# Patient Record
Sex: Female | Born: 1955 | Race: White | Hispanic: No | Smoking: Never smoker
Health system: Southern US, Community
[De-identification: ages and names within clinical notes are randomized; demographics above are authoritative.]

## PROBLEM LIST (undated history)

## (undated) DIAGNOSIS — K900 Celiac disease: Secondary | ICD-10-CM

## (undated) DIAGNOSIS — Z9889 Other specified postprocedural states: Secondary | ICD-10-CM

## (undated) DIAGNOSIS — R112 Nausea with vomiting, unspecified: Secondary | ICD-10-CM

## (undated) DIAGNOSIS — R011 Cardiac murmur, unspecified: Secondary | ICD-10-CM

## (undated) DIAGNOSIS — M797 Fibromyalgia: Secondary | ICD-10-CM

## (undated) DIAGNOSIS — F32A Depression, unspecified: Secondary | ICD-10-CM

## (undated) DIAGNOSIS — K219 Gastro-esophageal reflux disease without esophagitis: Secondary | ICD-10-CM

## (undated) DIAGNOSIS — T782XXA Anaphylactic shock, unspecified, initial encounter: Secondary | ICD-10-CM

## (undated) DIAGNOSIS — F329 Major depressive disorder, single episode, unspecified: Secondary | ICD-10-CM

## (undated) DIAGNOSIS — M1990 Unspecified osteoarthritis, unspecified site: Secondary | ICD-10-CM

## (undated) HISTORY — PX: WISDOM TOOTH EXTRACTION: SHX21

## (undated) HISTORY — DX: Morbid (severe) obesity due to excess calories: E66.01

## (undated) HISTORY — PX: SPINAL FUSION: SHX223

## (undated) HISTORY — DX: Cardiac murmur, unspecified: R01.1

## (undated) HISTORY — PX: OTHER SURGICAL HISTORY: SHX169

## (undated) HISTORY — PX: REPLACEMENT TOTAL KNEE: SUR1224

---

## 1998-08-21 ENCOUNTER — Other Ambulatory Visit: Admission: RE | Admit: 1998-08-21 | Discharge: 1998-08-21 | Payer: Self-pay | Admitting: Obstetrics and Gynecology

## 1998-08-22 ENCOUNTER — Encounter (INDEPENDENT_AMBULATORY_CARE_PROVIDER_SITE_OTHER): Payer: Self-pay

## 1998-08-22 ENCOUNTER — Other Ambulatory Visit: Admission: RE | Admit: 1998-08-22 | Discharge: 1998-08-22 | Payer: Self-pay | Admitting: Obstetrics and Gynecology

## 1999-05-23 ENCOUNTER — Other Ambulatory Visit: Admission: RE | Admit: 1999-05-23 | Discharge: 1999-05-23 | Payer: Self-pay | Admitting: *Deleted

## 1999-05-23 ENCOUNTER — Encounter (INDEPENDENT_AMBULATORY_CARE_PROVIDER_SITE_OTHER): Payer: Self-pay

## 1999-08-05 ENCOUNTER — Encounter: Admission: RE | Admit: 1999-08-05 | Discharge: 1999-08-05 | Payer: Self-pay | Admitting: Family Medicine

## 1999-08-05 ENCOUNTER — Encounter: Payer: Self-pay | Admitting: Family Medicine

## 1999-12-03 ENCOUNTER — Other Ambulatory Visit: Admission: RE | Admit: 1999-12-03 | Discharge: 1999-12-03 | Payer: Self-pay | Admitting: *Deleted

## 2000-01-03 ENCOUNTER — Encounter (INDEPENDENT_AMBULATORY_CARE_PROVIDER_SITE_OTHER): Payer: Self-pay | Admitting: Specialist

## 2000-01-03 ENCOUNTER — Other Ambulatory Visit: Admission: RE | Admit: 2000-01-03 | Discharge: 2000-01-03 | Payer: Self-pay | Admitting: *Deleted

## 2000-05-04 ENCOUNTER — Other Ambulatory Visit: Admission: RE | Admit: 2000-05-04 | Discharge: 2000-05-04 | Payer: Self-pay | Admitting: Obstetrics and Gynecology

## 2000-09-02 ENCOUNTER — Encounter: Admission: RE | Admit: 2000-09-02 | Discharge: 2000-09-02 | Payer: Self-pay | Admitting: Family Medicine

## 2000-09-02 ENCOUNTER — Encounter: Payer: Self-pay | Admitting: Family Medicine

## 2000-09-17 ENCOUNTER — Other Ambulatory Visit: Admission: RE | Admit: 2000-09-17 | Discharge: 2000-09-17 | Payer: Self-pay | Admitting: *Deleted

## 2001-03-09 ENCOUNTER — Other Ambulatory Visit: Admission: RE | Admit: 2001-03-09 | Discharge: 2001-03-09 | Payer: Self-pay | Admitting: Obstetrics and Gynecology

## 2001-09-03 ENCOUNTER — Encounter: Admission: RE | Admit: 2001-09-03 | Discharge: 2001-09-03 | Payer: Self-pay | Admitting: Family Medicine

## 2001-09-03 ENCOUNTER — Encounter: Payer: Self-pay | Admitting: Family Medicine

## 2001-09-30 ENCOUNTER — Other Ambulatory Visit: Admission: RE | Admit: 2001-09-30 | Discharge: 2001-09-30 | Payer: Self-pay | Admitting: Family Medicine

## 2002-01-26 ENCOUNTER — Ambulatory Visit (HOSPITAL_COMMUNITY): Admission: RE | Admit: 2002-01-26 | Discharge: 2002-01-26 | Payer: Self-pay | Admitting: Gastroenterology

## 2002-03-09 ENCOUNTER — Other Ambulatory Visit: Admission: RE | Admit: 2002-03-09 | Discharge: 2002-03-09 | Payer: Self-pay | Admitting: Family Medicine

## 2002-06-14 ENCOUNTER — Encounter: Payer: Self-pay | Admitting: Family Medicine

## 2002-06-14 ENCOUNTER — Encounter: Admission: RE | Admit: 2002-06-14 | Discharge: 2002-06-14 | Payer: Self-pay | Admitting: Family Medicine

## 2002-09-06 ENCOUNTER — Encounter: Admission: RE | Admit: 2002-09-06 | Discharge: 2002-09-06 | Payer: Self-pay | Admitting: Family Medicine

## 2002-09-06 ENCOUNTER — Encounter: Payer: Self-pay | Admitting: Family Medicine

## 2003-02-14 ENCOUNTER — Emergency Department (HOSPITAL_COMMUNITY): Admission: EM | Admit: 2003-02-14 | Discharge: 2003-02-14 | Payer: Self-pay | Admitting: *Deleted

## 2003-02-16 ENCOUNTER — Other Ambulatory Visit: Admission: RE | Admit: 2003-02-16 | Discharge: 2003-02-16 | Payer: Self-pay | Admitting: Family Medicine

## 2003-09-11 ENCOUNTER — Encounter: Admission: RE | Admit: 2003-09-11 | Discharge: 2003-09-11 | Payer: Self-pay | Admitting: Family Medicine

## 2004-01-05 ENCOUNTER — Ambulatory Visit (HOSPITAL_COMMUNITY): Admission: RE | Admit: 2004-01-05 | Discharge: 2004-01-05 | Payer: Self-pay | Admitting: Family Medicine

## 2004-01-26 ENCOUNTER — Ambulatory Visit (HOSPITAL_COMMUNITY): Admission: RE | Admit: 2004-01-26 | Discharge: 2004-01-26 | Payer: Self-pay | Admitting: Interventional Radiology

## 2004-02-09 ENCOUNTER — Ambulatory Visit (HOSPITAL_BASED_OUTPATIENT_CLINIC_OR_DEPARTMENT_OTHER): Admission: RE | Admit: 2004-02-09 | Discharge: 2004-02-09 | Payer: Self-pay | Admitting: Orthopedic Surgery

## 2004-02-14 ENCOUNTER — Ambulatory Visit (HOSPITAL_COMMUNITY): Admission: RE | Admit: 2004-02-14 | Discharge: 2004-02-14 | Payer: Self-pay | Admitting: Orthopedic Surgery

## 2004-02-21 ENCOUNTER — Ambulatory Visit (HOSPITAL_BASED_OUTPATIENT_CLINIC_OR_DEPARTMENT_OTHER): Admission: RE | Admit: 2004-02-21 | Discharge: 2004-02-21 | Payer: Self-pay | Admitting: Orthopedic Surgery

## 2004-02-28 ENCOUNTER — Ambulatory Visit (HOSPITAL_COMMUNITY): Admission: RE | Admit: 2004-02-28 | Discharge: 2004-02-28 | Payer: Self-pay | Admitting: Orthopedic Surgery

## 2004-06-04 ENCOUNTER — Other Ambulatory Visit: Admission: RE | Admit: 2004-06-04 | Discharge: 2004-06-04 | Payer: Self-pay | Admitting: Family Medicine

## 2004-10-14 ENCOUNTER — Encounter (INDEPENDENT_AMBULATORY_CARE_PROVIDER_SITE_OTHER): Payer: Self-pay | Admitting: Specialist

## 2004-10-14 ENCOUNTER — Ambulatory Visit (HOSPITAL_COMMUNITY): Admission: RE | Admit: 2004-10-14 | Discharge: 2004-10-14 | Payer: Self-pay | Admitting: Gastroenterology

## 2004-12-18 ENCOUNTER — Encounter: Admission: RE | Admit: 2004-12-18 | Discharge: 2004-12-18 | Payer: Self-pay | Admitting: Family Medicine

## 2005-07-29 ENCOUNTER — Ambulatory Visit (HOSPITAL_COMMUNITY): Admission: RE | Admit: 2005-07-29 | Discharge: 2005-07-29 | Payer: Self-pay | Admitting: Family Medicine

## 2005-08-13 ENCOUNTER — Ambulatory Visit: Payer: Self-pay | Admitting: Hematology and Oncology

## 2005-08-29 LAB — URINALYSIS, MICROSCOPIC - CHCC
Bilirubin (Urine): NEGATIVE
Glucose: NEGATIVE g/dL
Ketones: NEGATIVE mg/dL
RBC count: NEGATIVE (ref 0–2)

## 2005-08-29 LAB — CBC & DIFF AND RETIC
Basophils Absolute: 0.1 10*3/uL (ref 0.0–0.1)
Eosinophils Absolute: 0.1 10*3/uL (ref 0.0–0.5)
HCT: 35.2 % (ref 34.8–46.6)
HGB: 11.5 g/dL — ABNORMAL LOW (ref 11.6–15.9)
IRF: 0.26 (ref 0.130–0.330)
NEUT#: 2.5 10*3/uL (ref 1.5–6.5)
RDW: 17.8 % — ABNORMAL HIGH (ref 11.3–14.5)
RETIC #: 59.4 10*3/uL (ref 19.7–115.1)
lymph#: 1.9 10*3/uL (ref 0.9–3.3)

## 2005-08-29 LAB — CHCC SMEAR

## 2005-09-02 LAB — COMPREHENSIVE METABOLIC PANEL
ALT: 19 U/L (ref 0–40)
AST: 16 U/L (ref 0–37)
CO2: 20 mEq/L (ref 19–32)
Creatinine, Ser: 0.84 mg/dL (ref 0.40–1.20)
Total Bilirubin: 0.6 mg/dL (ref 0.3–1.2)

## 2005-09-02 LAB — FERRITIN: Ferritin: 2 ng/mL — ABNORMAL LOW (ref 10–291)

## 2005-09-02 LAB — DIRECT ANTIGLOBULIN TEST (NOT AT ARMC): DAT (Complement): NEGATIVE

## 2005-09-02 LAB — PROTEIN ELECTROPHORESIS, SERUM
Alpha-2-Globulin: 10.2 % (ref 7.1–11.8)
Beta 2: 5.4 % (ref 3.2–6.5)
Beta Globulin: 8 % — ABNORMAL HIGH (ref 4.7–7.2)
Total Protein, Serum Electrophoresis: 7.2 g/dL (ref 6.0–8.3)

## 2005-09-02 LAB — IRON AND TIBC: UIBC: 378 ug/dL

## 2005-09-02 LAB — ERYTHROPOIETIN: Erythropoietin: 41 m[IU]/mL — ABNORMAL HIGH (ref 2.6–34.0)

## 2005-10-03 ENCOUNTER — Ambulatory Visit: Payer: Self-pay | Admitting: Hematology and Oncology

## 2005-10-07 LAB — CBC WITH DIFFERENTIAL/PLATELET
Eosinophils Absolute: 0.2 10*3/uL (ref 0.0–0.5)
HGB: 13.3 g/dL (ref 11.6–15.9)
MONO#: 0.4 10*3/uL (ref 0.1–0.9)
NEUT#: 2.7 10*3/uL (ref 1.5–6.5)
Platelets: 299 10*3/uL (ref 145–400)
RBC: 5.05 10*6/uL (ref 3.70–5.32)
RDW: 23.9 % — ABNORMAL HIGH (ref 11.3–14.5)
WBC: 4.9 10*3/uL (ref 3.9–10.0)

## 2005-10-07 LAB — IRON AND TIBC
%SAT: 26 % (ref 20–55)
TIBC: 304 ug/dL (ref 250–470)

## 2005-10-07 LAB — BASIC METABOLIC PANEL
CO2: 25 mEq/L (ref 19–32)
Glucose, Bld: 110 mg/dL — ABNORMAL HIGH (ref 70–99)
Potassium: 4 mEq/L (ref 3.5–5.3)
Sodium: 142 mEq/L (ref 135–145)

## 2005-12-24 ENCOUNTER — Encounter: Admission: RE | Admit: 2005-12-24 | Discharge: 2005-12-24 | Payer: Self-pay | Admitting: Family Medicine

## 2005-12-31 ENCOUNTER — Ambulatory Visit: Payer: Self-pay | Admitting: Hematology and Oncology

## 2006-01-02 LAB — CBC WITH DIFFERENTIAL/PLATELET
EOS%: 6.1 % (ref 0.0–7.0)
MCH: 29.9 pg (ref 26.0–34.0)
MCV: 86.9 fL (ref 81.0–101.0)
MONO%: 7.9 % (ref 0.0–13.0)
RBC: 4.69 10*6/uL (ref 3.70–5.32)
RDW: 12.3 % (ref 11.3–14.5)

## 2006-01-02 LAB — IRON AND TIBC: %SAT: 23 % (ref 20–55)

## 2006-01-02 LAB — FERRITIN: Ferritin: 137 ng/mL (ref 10–291)

## 2006-01-02 LAB — BASIC METABOLIC PANEL
BUN: 14 mg/dL (ref 6–23)
Potassium: 4 mEq/L (ref 3.5–5.3)
Sodium: 142 mEq/L (ref 135–145)

## 2006-01-19 ENCOUNTER — Inpatient Hospital Stay (HOSPITAL_COMMUNITY): Admission: RE | Admit: 2006-01-19 | Discharge: 2006-01-22 | Payer: Self-pay | Admitting: Orthopedic Surgery

## 2006-04-20 ENCOUNTER — Inpatient Hospital Stay (HOSPITAL_COMMUNITY): Admission: RE | Admit: 2006-04-20 | Discharge: 2006-04-23 | Payer: Self-pay | Admitting: Orthopedic Surgery

## 2006-05-12 ENCOUNTER — Encounter: Admission: RE | Admit: 2006-05-12 | Discharge: 2006-06-24 | Payer: Self-pay | Admitting: Orthopedic Surgery

## 2006-12-28 ENCOUNTER — Encounter: Admission: RE | Admit: 2006-12-28 | Discharge: 2006-12-28 | Payer: Self-pay | Admitting: Family Medicine

## 2008-02-10 ENCOUNTER — Encounter: Admission: RE | Admit: 2008-02-10 | Discharge: 2008-02-10 | Payer: Self-pay | Admitting: Family Medicine

## 2008-09-19 ENCOUNTER — Ambulatory Visit (HOSPITAL_BASED_OUTPATIENT_CLINIC_OR_DEPARTMENT_OTHER): Admission: RE | Admit: 2008-09-19 | Discharge: 2008-09-19 | Payer: Self-pay | Admitting: Rheumatology

## 2008-09-29 ENCOUNTER — Ambulatory Visit: Payer: Self-pay | Admitting: Internal Medicine

## 2008-10-23 ENCOUNTER — Ambulatory Visit (HOSPITAL_COMMUNITY): Admission: RE | Admit: 2008-10-23 | Discharge: 2008-10-23 | Payer: Self-pay | Admitting: Rheumatology

## 2009-05-01 ENCOUNTER — Encounter: Admission: RE | Admit: 2009-05-01 | Discharge: 2009-05-01 | Payer: Self-pay | Admitting: Family Medicine

## 2009-05-07 ENCOUNTER — Emergency Department (HOSPITAL_COMMUNITY): Admission: EM | Admit: 2009-05-07 | Discharge: 2009-05-07 | Payer: Self-pay | Admitting: Emergency Medicine

## 2009-06-09 ENCOUNTER — Emergency Department (HOSPITAL_COMMUNITY): Admission: EM | Admit: 2009-06-09 | Discharge: 2009-06-09 | Payer: Self-pay | Admitting: Family Medicine

## 2009-07-11 ENCOUNTER — Other Ambulatory Visit: Admission: RE | Admit: 2009-07-11 | Discharge: 2009-07-11 | Payer: Self-pay | Admitting: Family Medicine

## 2009-10-19 ENCOUNTER — Ambulatory Visit (HOSPITAL_COMMUNITY): Admission: RE | Admit: 2009-10-19 | Discharge: 2009-10-19 | Payer: Self-pay | Admitting: Gastroenterology

## 2010-03-27 ENCOUNTER — Other Ambulatory Visit: Payer: Self-pay | Admitting: Family Medicine

## 2010-03-27 DIAGNOSIS — Z1231 Encounter for screening mammogram for malignant neoplasm of breast: Secondary | ICD-10-CM

## 2010-04-09 LAB — POCT I-STAT, CHEM 8
BUN: 9 mg/dL (ref 6–23)
Calcium, Ion: 1.09 mmol/L — ABNORMAL LOW (ref 1.12–1.32)
Creatinine, Ser: 0.9 mg/dL (ref 0.4–1.2)
Glucose, Bld: 95 mg/dL (ref 70–99)
Hemoglobin: 15.6 g/dL — ABNORMAL HIGH (ref 12.0–15.0)
Potassium: 3.8 mEq/L (ref 3.5–5.1)
Sodium: 138 mEq/L (ref 135–145)

## 2010-05-07 ENCOUNTER — Ambulatory Visit
Admission: RE | Admit: 2010-05-07 | Discharge: 2010-05-07 | Disposition: A | Discharge status: Home / Self Care | Payer: 59 | Source: Ambulatory Visit | Attending: Family Medicine | Admitting: Family Medicine

## 2010-05-07 DIAGNOSIS — Z1231 Encounter for screening mammogram for malignant neoplasm of breast: Secondary | ICD-10-CM

## 2010-05-21 ENCOUNTER — Ambulatory Visit: Payer: 59 | Attending: Orthopedic Surgery | Admitting: Physical Therapy

## 2010-05-21 DIAGNOSIS — IMO0001 Reserved for inherently not codable concepts without codable children: Secondary | ICD-10-CM | POA: Insufficient documentation

## 2010-05-21 DIAGNOSIS — M25569 Pain in unspecified knee: Secondary | ICD-10-CM | POA: Insufficient documentation

## 2010-05-21 DIAGNOSIS — Z96659 Presence of unspecified artificial knee joint: Secondary | ICD-10-CM | POA: Insufficient documentation

## 2010-05-27 ENCOUNTER — Ambulatory Visit: Payer: 59 | Admitting: Physical Therapy

## 2010-05-29 ENCOUNTER — Ambulatory Visit: Payer: 59 | Admitting: Physical Therapy

## 2010-06-04 ENCOUNTER — Ambulatory Visit: Payer: 59 | Admitting: Physical Therapy

## 2010-06-04 NOTE — Procedures (Signed)
Mary [name], Mary [name]                ACCOUNT NO.:  [id number]   MEDICAL RECORD NO.:  [id number]          PATIENT TYPE:  OUT   LOCATION:  SLEEP CENTER                 FACILITY:  [facility]   PHYSICIAN:  Clinton D. Maple Hudson, MD, FCCP, FACPDATE OF BIRTH:  07-14-1953   DATE OF STUDY:  09/19/2008                            NOCTURNAL POLYSOMNOGRAM   REFERRING PHYSICIAN:  Pollyann Savoy, M.D.   INDICATION FOR STUDY:  Hypersomnia with sleep apnea.   EPWORTH SLEEPINESS SCORE:  Epworth sleepiness score 16/24, BMI 41.3.  Weight 233 pounds, height 63 inches.  Neck 14 inches.   MEDICATIONS:  Home medications charted and reviewed.   SLEEP ARCHITECTURE:  Total sleep time 276 minutes with sleep efficiency  77.3%.  Stage I was 12%, stage II 79.3%, stage III absent, REM 8.7% of  total sleep time.  Sleep latency 47.5 minutes, REM latency 266.5  minutes, awake after sleep onset 31.5 minutes, arousal index 29.6.  Cymbalta was taken at 10:00 p.m.   RESPIRATORY DATA:  Apnea/hypopnea index (AHI) 0.7 per hour.  A total of  3 events were scored, all as hypopneas.  Most events were recorded while  lying supine although a considerable portion of the night was spent  lying prone.  REM AHI 2.5.  An additional 24 respiratory effort related  arousals were noted by the technician, events not meeting duration or  intensity criteria to be scored as apneas or hypopneas but still  recognized as contributing to a cumulative respiratory disturbance index  (RDI) of 5.9 per hour.  This was a diagnostic NPSG study.   OXYGEN DATA:  Mild to moderate snoring with oxygen desaturation to a  nadir of 86%.  Mean oxygen saturation through the study was 91% on room  air.  A total of 1.6 minutes was recorded with room air oxygen  concentration less than 88%.   CARDIAC DATA:  Sinus rhythm with occasional PAC.   MOVEMENT-PARASOMNIA:  No significant movement disturbance.  One bathroom  trip.   IMPRESSIONS-RECOMMENDATIONS:  1. Sleep  architecture was somewhat fragmented with reduced time spent      in REM which may reflect her Cymbalta or may simply be due to sleep      in an unfamiliar environment.  2. Occasional respiratory events with respiratory disturbance, within      normal limits, AHI 0.7 per hour (normal range 0-5 per hour).  Mild-      to-moderate snoring with oxygen desaturation to a nadir of 86%.      Clinton D. Maple Hudson, MD, [facility], FACP  Diplomate, Biomedical engineer of Sleep Medicine  Electronically Signed     CDY/MEDQ  D:  09/30/2008 11:02:55  T:  09/30/2008 12:52:20  Job:  [id number]

## 2010-06-05 ENCOUNTER — Encounter: Payer: 59 | Admitting: Physical Therapy

## 2010-06-07 NOTE — Op Note (Signed)
Mary [name], Mary [name] NO.:  [id number]   MEDICAL RECORD NO.:  [id number]          PATIENT TYPE:  INP   LOCATION:  X006                         FACILITY:  [facility]   PHYSICIAN:  Ollen Gross, M.D.    DATE OF BIRTH:  [birth year]   DATE OF PROCEDURE:  01/19/2006  DATE OF DISCHARGE:                               OPERATIVE REPORT   PREOPERATIVE DIAGNOSIS:  Osteoarthritis, left knee.   POSTOPERATIVE DIAGNOSIS:  Osteoarthritis, left knee.   PROCEDURE:  Left total knee arthroplasty.   SURGEON:  Ollen Gross, M.D.   ASSISTANT:  Avel Peace, PA-C   ANESTHESIA:  Spinal.   ESTIMATED BLOOD LOSS:  Minimal.   DRAINS:  Hemovac times one.   TOURNIQUET TIME:  41 minutes at 300 mmHg.   COMPLICATIONS:  None.  Condition stable to recovery.   BRIEF CLINICAL NOTE:  Mary [name] is a 55 year old female with end-stage  arthritis of both knees, left currently more symptomatic than the right.  She has failed nonoperative management presents for total knee  arthroplasty.   PROCEDURE IN DETAIL:  After successful administration of spinal  anesthetic, a tourniquet was placed high on the left thigh and left  lower extremity prepped and draped in the usual sterile fashion.  Extremities wrapped in Esmarch, knee flexed, tourniquet inflated to 300  mmHg.  Midline incision made with 10 blade through subcutaneous tissue  to the level of the extensor mechanism.  Fresh blade is used make a  medial parapatellar arthrotomy.  Soft tissue over the proximal medial  tibia subperiosteally elevated to the joint line with a knife and into  the semimembranosus bursa with a Cobb elevator.  Soft tissue laterally  is elevated with attention being paid to avoid patellar tendon on tibial  tubercle.  Patella subluxed laterally, knee flexed 90 degrees and ACL  and PCL removed.  Drill was used to create a starting hole in a distal  femur and canal was thoroughly irrigated.  5 degree left valgus  alignment guide  is placed referencing off the posterior condyles,  rotations marked and the block pinned to remove 10 mm off the distal  femur.  Distal femoral resection is made with an oscillating saw.  Sizing blocks placed and a size 3 is most appropriate.  Rotations marked  off the epicondylar axis.  Size 3 cutting block is placed and the  anterior-posterior chamfer cuts made.   Tibia subluxed forward and the menisci removed.  Extramedullary tibial  alignment guides placed referencing proximally at the medial aspect of  the tibial tubercle and distally along the second metatarsal axis and  tibial crest.  Blocks pinned to remove 10 mm of the non deficient  lateral side.  Tibial resection is made with an oscillating saw.  Size 3  is the most appropriate tibial component and the proximal tibia is  prepared the modular drill and keel punch for a size 3.  Femoral  preparation is completed with intercondylar cut.   Size 3 mobile bearing tibial trial with a size 3 posterior stabilized  femoral trial and a 10 mL posterior stabilized rotating  platform insert  trial were placed.  With the 10 there is just a tiny bit hyperextension  so I went to 12.5 which still allowed for full extension with excellent  varus and valgus balance throughout full range of motion and no  hyperextension noted.  The patella was everted, thickness measured to 23  mm.  Freehand resection is taken 13 mm, 38 template is placed, lug holes  were drilled, trial patella was placed and it tracks normally.  Osteophytes removed the distal femur with the trial placed.  All trials  were removed and the cut bone surfaces prepared with pulsatile lavage.  Cement was mixed and once ready for implantation, the size 3 mobile  bearing tibial tray, size 3 posterior stabilized femur and 38 patella  are cemented into place.  The patella was held with a clamp.  Trial 12.5  inserts placed, knee held in full extension all extruded cement removed.  Once  cement fully hardened, the permanent 12.5 mm posterior stabilized  rotating platform insert is placed into the tibial tray.  Wound was  copiously irrigated with saline solution.  The extensor mechanism then  closed over Hemovac drain with interrupted #1 PDS.  Flexion against  gravity to 125 degrees.  Tourniquet released for total time of 41  minutes.  Subcu closed with interrupted 2-0 Vicryl and subcuticular  running 4-0 Monocryl.  The drains hooked to suction.  Incision cleaned  and dried and Steri-Strips and bulky sterile dressing applied.  She is  then awakened and transferred to recovery in stable condition.      Ollen Gross, M.D.  Electronically Signed     FA/MEDQ  D:  01/19/2006  T:  01/19/2006  Job:  [id number]

## 2010-06-07 NOTE — Op Note (Signed)
[name], [name] NO.:  [id number]   MEDICAL RECORD NO.:  [id number]          PATIENT TYPE:  INP   LOCATION:  0005                         [facility]ACILITY[facility]   PHYSICIAN:  Ollen Gross, M.D.    DATE OF BIRTH:  [birth year]   DATE OF PROCEDURE:  04/20/2006  DATE OF DISCHARGE:                               OPERATIVE REPORT   PREOPERATIVE DIAGNOSIS:  Osteoarthritis right knee.   POSTOPERATIVE DIAGNOSIS:  Osteoarthritis right knee.   OPERATION/PROCEDURE:  Right total knee arthroplasty.   SURGEON:  Ollen Gross, M.D.   ASSISTANT:  Alexzandrew L. Julien Girt, P.A-C.   ANESTHESIA:  Spinal.   ESTIMATED BLOOD LOSS:  Minimal.   DRAINS:  Hemovac times one.   TOURNIQUET TIME:  43 minutes at 300 mmHg.   COMPLICATIONS:  None.   CONDITION:  Stable to recovery room.   CLINICAL NOTE:  Mary [name] is a 55 year old female who has had progressively  worsening right knee pain and dysfunction with bone on bone arthritis in  the knee.  She has had a recent successful left total knee arthroplasty  and presents now for right total knee arthroplasty.   PROCEDURE IN DETAIL:  After successful administration of spinal  anesthetic, a tourniquet was placed high on the right thigh and right  lower extremity prepped and draped in the usual sterile fashion.  Extremity was wrapped in Esmarch, knee flexed, tourniquet inflated to  300 mmHg.  Standard midline incision made with 10-blade through  subcutaneous tissue to elevate the extensor mechanism.  Fresh blade is  used make a medial parapatellar arthrotomy.  Soft tissue over the  proximal medial tibia is subperiosteally elevated to the joint line with  the knife and into the semimembranosus bursa with a Cobb elevator.  Soft  tissue laterally is elevated with attention being paid to avoid patellar  tendon on tibial tubercle.  The patella subluxed laterally, knee flexed  90 degrees.  ACL and PCL removed.  Drill was used create a starting  hole  in the distal femur and the canal was thoroughly irrigated.  A 5-degree  right valgus alignment guide is placed referencing off the posterior  condyles.  Rotations marked and the block pinned to remove 10 mm off the  distal femur.  Distal femoral resection is made with an oscillating saw.  Size 3 is the most appropriate femoral component and the rotations  marked off the epicondylar axis.  Size three cutting blocks placed in  the anterior-posterior and chamfer cuts made.   Tibia subluxed forward and the menisci removed.  Extramedullary tibial  alignment guide is placed referencing proximally at the medial aspect of  the tibial tubercle and distally along the second metatarsal axis and  tibial crest.  The block is pinned to remove 10 mm of the non-deficient  lateral side.  Tibial resection is made with an oscillating saw.  Size  2.5 is the most appropriate tibial component and the proximal tibia  prepared with the modular drill and keel punch for a size 2.5.  Femoral  preparation is completed with the intercondylar cut for the size  3.   Size 2.5 mobile bearing tibial trial, size 3 posterior stabilized  femoral trial, and 10 mL posterior stabilized rotating platform insert  trial placed.  With the 10, full extensions achieved with excellent  varus and valgus balance throughout full range of motion.  The patella  was then everted and thickness measured to be 21 mm.  Freehand resection  taken to 13 mm, 35 template is placed, lug holes drilled, trial patella  placed, and it tracks normally.  Osteophytes removed off the posterior  femur with the trial placed.  All trials removed and the cut bone  surfaces are prepared with pulsatile lavage.  Cement is mixed and once  ready for implantation, the size 2.5 mobile bearing tibial tray size 3  posterior stabilized femur and 35 patella are cemented in place.  The  patella is held with a clamp.  A 10 mm trial insert is placed, knee held  in  full extension and all extruded cement removed.  Once cement fully  hardened, then the permanent 10 mm posterior stabilized rotating  platform insert is placed into the tibial tray.  The wound is copiously  irrigated with saline solution.  The extensor mechanism was closed over  Hemovac drain with interrupted #1 Vicryl.  The tourniquet is then  released with total time of 43 minutes.  Flexion against gravity to 135  degrees.  Subcu closed with interrupted 2-0 Vicryl subcuticular and  running 4-0 Monocryl.  Drains hooked to suction.  Incision cleaned and  dried.  Steri-Strips and a bulky sterile dressing applied.  She is  placed into a knee immobilizer, awakened and transported to recovery in  stable condition.      Ollen Gross, M.D.  Electronically Signed     FA/MEDQ  D:  04/20/2006  T:  04/20/2006  Job:  [id number]

## 2010-06-07 NOTE — Discharge Summary (Signed)
NAMEKENSI, [name]                ACCOUNT NO.:  [id number]   MEDICAL RECORD NO.:  [id number]          PATIENT TYPE:  INP   LOCATION:  1520                         FACILITY:  [facility]   PHYSICIAN:  Ollen Gross, M.D.    DATE OF BIRTH:  12-May-1955   DATE OF ADMISSION:  04/20/2006  DATE OF DISCHARGE:  04/23/2006                               DISCHARGE SUMMARY   ADMISSION DIAGNOSES:  1. Osteoarthritis right knee.  2. Headaches.  3. Mild depression.  4. Fibromyalgia.  5. Lymphocytic colitis.  6. Hemorrhoids.  7. History of urinary tract infections.  8. History of cystitis.  9. Anemia.  10.Postmenopausal.   DISCHARGE DIAGNOSIS:  1. Osteoarthritis right knee, status post right total knee      arthroplasty.  2. Mild postop blood loss anemia.  Did not require transfusion.  3. Headaches.  4. Mild depression.  5. Fibromyalgia.  6. Lymphocytic colitis.  7. Hemorrhoids.  8. History of urinary tract infections.  9. History of cystitis.  10.Anemia.  11.Postmenopausal.   PROCEDURE:  04/20/2006, right total knee arthroplasty.  Surgeon Dr.  Ollen Gross, assistant Avel Peace PA-C.  Spinal anesthesia.  Time  43 minutes.   CONSULTATIONS:  Consults none.   BRIEF HISTORY:  [name] is a 55 year old female who has had  progressive worsening right knee pain and dysfunction with bone-on-bone  arthritis with recent successful left total knee now presents for right  total knee.   LABORATORY DATA:  Preop CBC showed hemoglobin of 14.9, hematocrit 43.7,  white cell count preop was 5.9, postop hemoglobin 12.2.  It drifted down  to 10.5.  Last noted H&H stabilized at 10.5 with a crit of 30.1.  PT/PTT  on admission 12.3 and 30 respectively.  INR 0.9.  Serial protimes  followed.  Last PT/INR 28.2, 2.5 Chem panel on admission all within  normal limits.  Serial BMPs followed.  Electrolytes remained within  normal limits.  Preop UA negative.  Blood type O+.   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  No other abnormalities.   HOSPITAL COURSE:  The patient was admitted to [facility],  tolerated her procedure well, later to recovery and orthopedic floor.  Started on PCA and p.o. analgesic for pain control following surgery.  Given 24 hours postop IV antibiotics.  Started on Coumadin for DVT  prophylaxis.  Actually doing pretty well on the morning of day one  rounds.  She was using the PCA.  Encouraged p.o. meds for better  control.  Foley was removed.  She had history of UTIs, so preop UA was  negative.  Would recheck her urine if she developed any symptoms.  She  had a little bit of low urinary output, but she was voiding.  Monitored  her output and output did increase.  She actually had much more decent  output through that afternoon.   She got up out of bed by day two.  She was doing better.  Started  getting up with physical therapy.  She was up ambulating approximately  about 65 feet.  Dressing change incision looked good.  IV fluids are  discontinued.  Dressing was changed.  Incision healing well.  Continued  to progress well and was ready to go home by the following day of postop  day #3.  She had a little bit of drop in her O2 sats on the evening of  day two, morning of day 3, so we did check a chest x-ray and it only  showed some mild atelectasis.  She did have a normal white count.  Once  that was okay, encouraged incentive spirometer and tolerating her meds  and was discharged home.   DISCHARGE/PLAN:  1. The patient discharged home on 04/23/2006.  2. Discharge diagnoses please see above.  3. Discharge meds:  Coumadin, Percocet, Robaxin.  4. Diet.  Resume home diet.  5. Follow-up 2 weeks.  6. Activity:  Weightbearing as tolerated.  Total knee protocol.  Home      health PT, home health nursing.   DISPOSITION:  Home.   CONDITION ON DISCHARGE:  Improving.      Alexzandrew L. Perkins, P.A.C.       Ollen Gross, M.D.  Electronically Signed    ALP/MEDQ  D:  05/27/2006  T:  05/27/2006  Job:  [id number]   cc:   Ollen Gross, M.D.  Fax: [phone]   Talmadge Coventry, M.D.  Fax: [phone]   EXBMWU XLK GMWN, M.D.  Fax: [phone]

## 2010-06-07 NOTE — Op Note (Signed)
Mary [name], Mary [name]                ACCOUNT NO.:  [id number]   MEDICAL RECORD NO.:  [id number]          PATIENT TYPE:  AMB   LOCATION:  ENDO                         FACILITY:  [facility]   PHYSICIAN:  Anselmo Rod, M.D.  DATE OF BIRTH:  01-Mar-1955   DATE OF PROCEDURE:  10/14/2004  DATE OF DISCHARGE:                                 OPERATIVE REPORT   PROCEDURE:  Esophagogastroduodenoscopy with small bowel biopsy.   ENDOSCOPIST:  Charna Elizabeth, M.D.   INSTRUMENT USED:  Olympus video panendoscope.   INDICATIONS FOR PROCEDURE:  55 year old white female undergoing EGD for  change in bowel habits.   PREPROCEDURE PREPARATION:  Informed consent was obtained from the patient.  The patient was fasted for eight hours prior to the procedure.   PREPROCEDURE PHYSICAL:  Patient with stable vital signs.  Neck supple.  Chest clear to auscultation.  S1 and S2 regular.  Abdomen soft with normal  bowel sounds.   DESCRIPTION OF PROCEDURE:  The patient was placed in the left lateral  decubitus position, sedated with 60 mg of Demerol and 6 mg Versed in slow  incremental doses.  Once the patient was adequately sedated, maintained on  low flow oxygen and continuous cardiac monitoring, the Olympus video  panendoscope was advanced through the mouth piece over the tongue into the  esophagus under direct vision.  The entire esophagus appeared normal with no  evidence of ring, strictures, masses, esophagitis, or Barrett's mucosa.  The  scope was then advanced into the stomach.  There was evidence of diffuse  gastritis.  There was some old heme noted in the stomach.  Gastric biopsies  were done to rule out H. pylori.  The proximal small bowel appeared normal.  Small bowel biopsies were done to rule out sprue.  Retroflexion in the high  cardia revealed no abnormalities.   IMPRESSION:  1.  Normal appearing esophagus.  2.  Diffuse gastritis, biopsies done for H. pylori.  3.  Normal proximal small bowel, small  bowel biopsies done to rule out      sprue.   RECOMMENDATIONS:  1.  Await pathology results.  2.  Proceed with colonoscopy at this time.  3.  Avoid all nonsteroidals for now.  4.  Outpatient follow up in the next two weeks for further recommendations.      Anselmo Rod, M.D.  Electronically Signed     JNM/MEDQ  D:  10/14/2004  T:  10/15/2004  Job:  [id number]   cc:   Talmadge Coventry, M.D.  Fax: [phone]

## 2010-06-07 NOTE — Op Note (Signed)
Mary [name], Mary [name]                ACCOUNT NO.:  [id number]   MEDICAL RECORD NO.:  [id number]          PATIENT TYPE:  AMB   LOCATION:  ENDO                         FACILITY:  [facility]   PHYSICIAN:  Anselmo Rod, M.D.  DATE OF BIRTH:  06-03-1955   DATE OF PROCEDURE:  10/14/2004  DATE OF DISCHARGE:                                 OPERATIVE REPORT   PROCEDURE:  Screening colonoscopy.   ENDOSCOPIST:  Charna Elizabeth, M.D.   INSTRUMENT USED:  Olympus video colonoscope.   INDICATIONS FOR PROCEDURE:  55 year old white female with a history of  diarrhea for the last few months undergoing colonoscopy to rule out colonic  polyps, masses, etc.   PREPROCEDURE PREPARATION:  Informed consent was obtained from the patient.  The patient was fasted for eight hours prior to the procedure and prepped  with Osmoprep the night prior to the procedure and the morning of the  procedure according to protocol.  The risks and benefits of the procedure  including a 10% miss rate of cancer and polyps were discussed with the  patient, as well.   PREPROCEDURE PHYSICAL:  Patient with stable vital signs.  Neck supple.  Chest clear to auscultation.  S1 and S2 regular.  Abdomen soft with normal  bowel sounds.   DESCRIPTION OF PROCEDURE:  The patient was placed in the left lateral  decubitus position, sedated with 20 mg of Demerol and 4 mg Versed in slow  incremental doses.  Once the patient was adequately sedated, maintained on  low flow oxygen and continuous cardiac monitoring, the Olympus video  colonoscope was advanced from the rectum to the cecum.  Small internal  hemorrhoids were seen on retroflexion of the rectum.  No masses or polyps  were identified.  Multiple colonic biopsies were done randomly throughout  the colon to rule out collagenous or microscopic colitis.  The patient  tolerated the procedure well without complications.   IMPRESSION:  1.  Small nonbleeding internal hemorrhoids.  2.  No masses,  polyps, or diverticula seen, multiple random biopsies done to      rule out collagenous or microscopic colitis.   RECOMMENDATIONS:  1.  Await pathology results.  2.  Avoid nonsteroidals.  3.  Outpatient follow up in the next few weeks for further recommendations.      Anselmo Rod, M.D.  Electronically Signed     JNM/MEDQ  D:  10/14/2004  T:  10/15/2004  Job:  [id number]   cc:   Mary [name], M.D.  Fax: [phone]

## 2010-06-07 NOTE — Op Note (Signed)
NAMEPILAR, Mary [name]                ACCOUNT NO.:  [id number]   MEDICAL RECORD NO.:  [id number]          PATIENT TYPE:  AMB   LOCATION:  DSC                          FACILITY:  [facility]   PHYSICIAN:  Thera Flake., M.D.DATE OF BIRTH:  Jul 08, 1955   DATE OF PROCEDURE:  02/09/2004  DATE OF DISCHARGE:                                 OPERATIVE REPORT   REFERRING PHYSICIAN:  Sanjeev K. Corliss Skains, M.D.   INDICATIONS FOR PROCEDURE:  This is a 56 year old MRI proven meniscal tear  with degenerative change thought to be amenable to outpatient surgery.   PREOPERATIVE DIAGNOSES:  1.  Complex tear posterior horn medial meniscus left knee.  2.  Grade III chondromalacia patellofemoral joint.  3.  Grade III chondromalacia medial compartment (generalized).   OPERATION:  1.  Partial medial meniscectomy (posterior horn).  2.  Debridement chondroplasty patellofemoral joint medial compartment.   SURGEON:  Dyke Brackett, M.D.   ANESTHESIA:  MAC.   DESCRIPTION OF PROCEDURE:  She was arthroscope for an inferior medial and  inferolateral portal.  Systematic inspection of the knee showed generalized  grade III chondromalacia most noticeable on the patella.  All areas of the  patella were involved, probably greater than 50% of the surface area.  Trochlea grid was relatively spared.  Again, extensive aggressive  debridement of the patellofemoral joint was carried out separate from the  medial compartment.  Lateral compartment was normal.  ACL/PCL normal.   Medial compartment showed an extensive tear of the posterior horn requiring  resection of about 30 to 40% of the meniscus substance.  There was actually  rather generalized grade III changes along the entire medial compartment  with no grade IV changes noted.  These were aggressively debrided.  Certainly this was noticeable compared to the  opposite side.  Again, the medial and patellofemoral joint were debrided.  Medial meniscectomy.  Knee drained  free of fluid.  Portals closed with  nylon.  Knee injected with Marcaine and an additional 40 mg Depo-Medrol,  taken to the recovery room in stable condition.      WDC/MEDQ  D:  02/09/2004  T:  02/09/2004  Job:  [id number]   cc:   Sanjeev K. Corliss Skains, M.D.  [address] [place]  [place]  [place] [address]  Fax: [phone]

## 2010-06-07 NOTE — Discharge Summary (Signed)
Mary [name], Mary [name]                ACCOUNT NO.:  [id number]   MEDICAL RECORD NO.:  [id number]          PATIENT TYPE:  INP   LOCATION:  1509                         FACILITY:  [facility]   PHYSICIAN:  Ollen Gross, M.D.    DATE OF BIRTH:  [birth year]   DATE OF ADMISSION:  01/19/2006  DATE OF DISCHARGE:  01/22/2006                               DISCHARGE SUMMARY   ADMISSION DIAGNOSES:  1. Osteoarthritis of bilateral knees, left greater than right.  2. Fibromyalgia.  3. Mild depression.  4. Headaches.  5. Lymphocytic colitis.  6. Hemorrhoids.  7. History of urinary tract infections.  8. History of cystitis.  9. History of anemia.  10.Post-menopausal.   DISCHARGE DIAGNOSES:  1. Osteoarthritis, left knee, status post left total knee replacement      arthroplasty.  2. Osteoarthritis, right knee.  3. Fibromyalgia.  4. Mild depression.  5. Headaches.  6. Lymphocytic colitis.  7. Hemorrhoids.  8. History of urinary tract infections.  9. History of cystitis.  10.History of anemia.  11.Post-menopausal.  12.Mild postoperative hypokalemia.   PROCEDURE:  A left total knee surgery by Dr. Ollen Gross with  assistant Alexzandrew L. Perkins, P.A.-C. under spinal anesthesia.  The  tourniquet time was 41 minutes.   CONSULTATIONS:  None.   HISTORY:  Ms. Nowotny is a 54 year old female with end-stage  osteoarthritis of both knees, the left currently more symptomatic than  the right.  She has failed on medical management and now presents for a  total knee.   LABORATORY DATA:  Hemoglobin 14.8 on admission, hematocrit 43.4, white  count 4.3.  Postoperative hemoglobin 13, which drifted down.  The last  H&H was 12.3 and 36.  PT and PTT on admission 12.1 and 29 respectively,  INR 0.9.  The last PTT and INR was 22.1 and 1.8.  The chemistry panel on  admission all within normal limits.  The serial BMETs were followed.  The potassium did drop a little bit, just barely low at 3.4.  The  remaining  BMET within normal limits.  Urine pregnancy was negative.  A  preop urinalysis was negative.  Blood group type O-positive.   Electrocardiogram on January 06, 2006:  Normal sinus rhythm, normal  electrocardiogram when compared to previous electrocardiogram of February 14, 2003.  No significant change since the last tracing confirmed by Dr.  Nicki Guadalajara.   A chest x-ray on January 06, 2006:  No active cardiopulmonary disease.   HOSPITAL COURSE:  The patient was admitted to [facility].  She  tolerated the procedure well.  She was taken to the recovery room and  later to the orthopedic floor.  Started on PCA and p.o. medications for  pain control following surgery.  Given 24 hours of postoperative IV  antibiotics.  On the morning of day one after surgery was actually doing  pretty well, apparently comfortable.  Was weaning over to p.o.  medications.  A Hemovac drain placed at surgery was pulled without  difficulty.  She started getting up with PT and used CPM for early  motion.  By day  two she was doing better.  No complaints.  Good pain  control.  The PCA and IVs were discontinued.  Weaned over to p.o.  medications.  From a therapy standpoint, started getting up and actually  walked for 80 feet and later 150 feet that afternoon.  Did very well  with PT and was ready to go home by the following day of January 22, 2006.   DISPOSITION:  The patient was doing well and was discharged home on  January 22, 2006.   DISCHARGE MEDICATIONS:  1. Coumadin.  2. Robaxin.   DIET:  As tolerated.   FOLLOWUP:  Follow up in two weeks.   ACTIVITY:  Weightbearing as tolerated.  PT, home nursing, total knee  protocol.   CONDITION ON DISCHARGE:  Improved.      Alexzandrew L. Julien Girt, P.A.      Ollen Gross, M.D.  Electronically Signed    ALP/MEDQ  D:  03/03/2006  T:  03/03/2006  Job:  [id number]   cc:   Talmadge Coventry, M.D.  Fax: [phone]   UVOZDG UYQ IHKV, M.D.  Fax:  [phone]

## 2010-06-07 NOTE — H&P (Signed)
Mary [name], Mary [name]                ACCOUNT NO.:  [id number]   MEDICAL RECORD NO.:  [id number]          PATIENT TYPE:  INP   LOCATION:  NA                           FACILITY:  [facility]   PHYSICIAN:  Ollen Gross, M.D.    DATE OF BIRTH:  1955-06-30   DATE OF ADMISSION:  01/19/2006  DATE OF DISCHARGE:                              HISTORY & PHYSICAL   CHIEF COMPLAINT:  Bilateral knee pain, left greater than right.   HISTORY OF PRESENT ILLNESS:  The patient is a 55 year old female with  bilateral knee pain, left greater than right.  She is seeking a second  opinion by Dr. Lequita Halt.  Was found to have bilateral knee arthritis.  It  has been ongoing for quite some time now.  She has had arthroscopies in  the past, but eventually got somewhat worse since her scopes.  She  continues to have pain.  She has reached the point where she would like  to have something done about it.  Risks and benefits have been  discussed, and it is felt she would benefit from undergoing knee  replacement.  She will proceed with a left knee.   ALLERGIES:  NSAIDS and ASPIRIN PRODUCTS aggravate her colitis.   CURRENT MEDICATIONS:  Cymbalta.   PAST MEDICAL HISTORY:  1. Headaches.  2. Mild depression.  3. Fibromyalgia.  4. Lymphocytic colitis.  5. Hemorrhoids.  6. History of urinary tract infections.  7. History of cystitis.  8. Anemia.  9. Postmenopausal.   PAST SURGICAL HISTORY:  1. Spinal fusion in May of 1974.  2. Cyst removed from the rectum in April of 1979.  3. Left knee arthroscopy in January of 2006.  4. Right knee arthroscopy in February of 2006.   SOCIAL HISTORY:  Divorced.  Works as a Diplomatic Services operational officer.  Nonsmoker.  One to 2  alcohol drinks per month.  Has 2 children.  Daughter, son and mother  will be assisting with care after surgery.   FAMILY HISTORY:  Father with a history of leukemia.  Mother with a  history of arthritis.  Grandmother with a history of arthritis.  Another  grandmother with a  history of breast cancer.  She has a great aunt with  type 2 diabetes.   REVIEW OF SYSTEMS:  GENERAL:  No fevers, chills or night sweats.  NEUROLOGIC:  No seizures, syncope or paralysis.  RESPIRATORY:  No  shortness of breath, productive cough or hemoptysis.  CARDIOVASCULAR:  No chest pain or orthopnea.  GI:  Does have a history of hemorrhoids,  colitis.  No nausea, vomiting, diarrhea or constipation at this time.  GU:  History of UTIs and cystitis.  No dysuria, hematuria or discharge.  MUSCULOSKELETAL:  Bilateral knees.   PHYSICAL EXAMINATION:  VITAL SIGNS:  Pulse 72, respirations 14, blood  pressure 134/82.  GENERAL:  A 55 year old white female, well-nourished, well-developed, in  no acute distress.  She is alert, oriented, cooperative.  Very pleasant.  A good historian.  She does wear some reading glasses some of the time.  HEENT:  Normocephalic and atraumatic.  Pupils equal, round  and reactive.  Oropharynx clear.  Extraocular movements intact.  NECK:  Supple.  CHEST:  Clear.  HEART:  Regular rate and rhythm.  No murmur.  S1 and S2 noted.  ABDOMEN:  Soft, nontender.  Bowel sounds present.  RECTAL/BREAST/GENITALIA:  Not done.  Not pertinent to present illness.  EXTREMITIES:  Left knee with range of motion 5 to 120, marked crepitus,  neurovascular intact, no instability.  Right knee shows range of motion  of 5 to 120, marked crepitus, neurovascular intact, no instability.   IMPRESSION:  1. Osteoarthritis, bilateral knees, left greater than 4.  2. Fibromyalgia.  3. Mild depression.  4. Headaches.  5. Lymphocytic colitis.  6. Hemorrhoids.  7. History of urinary tract infections.  8. History of cystitis.  9. History of anemia.  10.Postmenopausal.   PLAN:  The patient admitted to [facility] to undergo left  total knee arthroplasty.  Surgery will be performed by Dr. Ollen Gross.      Alexzandrew L. Julien Girt, P.A.      Ollen Gross, M.D.  Electronically  Signed    ALP/MEDQ  D:  01/18/2006  T:  01/19/2006  Job:  [id number]   cc:   Talmadge Coventry, M.D.  Fax: [phone]   JXBJYN WGN FAOZ, M.D.  Fax: [phone]

## 2010-06-07 NOTE — Op Note (Signed)
Mary [name], Mary [name]                          ACCOUNT NO.:  [id number]   MEDICAL RECORD NO.:  [id number]                   PATIENT TYPE:  AMB   LOCATION:  ENDO                                 FACILITY:  [facility]   PHYSICIAN:  Anselmo Rod, M.D.               DATE OF BIRTH:  Nov 29, 1955   DATE OF PROCEDURE:  01/26/2002  DATE OF DISCHARGE:                                 OPERATIVE REPORT   PROCEDURE:  Screening colonoscopy endoscopy.   INSTRUMENT USED:  Olympus video colonoscope.   INDICATIONS FOR PROCEDURE:  This 55 year old white female  with a family  history of colon cancer in two maternal uncles, one paternal uncle, and  breast cancer in maternal grandmother.  Rule out colonic polyps, masses,  etc.   PREPROCEDURE PREPARATION:  Informed consent was obtained from the patient.  The patient fasted for eight prior to the procedure and prepped with a  bottle of MiraLax and a bottle of Gatorade the night prior to the procedure.   BRIEF HISTORY AND PHYSICAL:  VITAL SIGNS:  The patient had stable vital  signs.  NECK:  Supple.  CHEST:  Clear to auscultation.  CARDIAC:  S1, S2.  ABDOMEN:  Abdomen soft with normal bowel sounds.   DESCRIPTION OF PROCEDURE:  The  patient was placed in the left lateral  decubitus position and sedated with 50 mg of Demerol and 5 mg of Versed  intravenously.  Once the patient was adequately sedated and maintained on  low flow oxygen and continuous cardiac monitoring, the Olympus video  colonoscope was advanced in the rectum to cecum and terminal ileum without  difficulty.  There was some stool in  the colon.  Multiple washings were  done, and the stool was suctioned out.  The appendicular orifices and  ileocecal valve directly visualized and photographed. The terminal ileum  appeared normal as well.  No masses, polyps, erosions, ulcerations, or  diverticulosis was noted.  A small internal hemorrhoid was appreciated on  retroflexion of the rectum.  The  patient tolerated the procedure well  without complications.   IMPRESSION:  Normal colonoscopy up to the terminal ileum except for small  nonbleeding internal hemorrhoid.   RECOMMENDATIONS:  1. A high fiber diet with liberal fluid intake has been recommended for the     patient.  2.     Repeat colorectal cancer screening is recommended in the next five years,     unless the patient develops abnormal symptoms in the interim.  3. Outpatient followup on a p.r.n. basis.                                               Anselmo Rod, M.D.    JNM/MEDQ  D:  01/26/2002  T:  01/26/2002  Job:  [id number]   cc:   Talmadge Coventry, M.D.  [address], [address]  [place]  [place] [address]  Fax: [phone]

## 2010-06-07 NOTE — H&P (Signed)
NAMESOLANGEL, MCMANAWAY NO.:  [id number]   MEDICAL RECORD NO.:  [id number]          PATIENT TYPE:  INP   LOCATION:  NA                           FACILITY:  [facility]   PHYSICIAN:  Ollen Gross, M.D.    DATE OF BIRTH:  [birth year]   DATE OF ADMISSION:  04/20/2006  DATE OF DISCHARGE:                              HISTORY & PHYSICAL   Date of office visit, history and physical the history March 31, 2006.  Date of admission April 20, 2006.   CHIEF COMPLAINT:  Right knee pain.   HISTORY OF PRESENT ILLNESS:  The patient is a 55 year old female who has  seen by Dr. Lequita Halt for ongoing right knee pain.  She has successfully  undergone a left total knee in the past back in December 2007 doing well  with that.  She has reached the point where she would like to have the  other side done.  Risks and benefits discussed.  The patient is  subsequently admitted to the hospital.   ALLERGIES:  1. NSAIDS.  2. ASPIRIN PRODUCTS.  3. Also CELEBREX.   CURRENT MEDICATIONS:  Cymbalta, multivitamin, iron, Citrucel, salmon  oil, B1.   PAST MEDICAL HISTORY:  1. Headaches.  2. Mild depression.  3. Fibromyalgia.  4. Lymphocytic colitis.  5. History of urinary tract infections.  6. History of cystitis.  7. Anemia.  8. Postmenopausal.   PAST SURGICAL HISTORY:  1. A cervical spinal fusion May of 1974.  2. Dermoid cyst removed from the rectum April 1979.  3. Left knee arthroscopy January 2006.  4. Right knee arthroscopy February 2007.  5. Left knee replacement January 19, 2006.   SOCIAL HISTORY:  Divorced, works as a Diplomatic Services operational officer at [employer],  nonsmoker, one drink of alcohol per week.  Two children.  Daughter and  mother will be assisting with care after surgery.   FAMILY HISTORY:  Father with history of leukemia.  Mother with history  of arthritis.  Grandmother with history of arthritis.  Another  grandmother with history of breast cancer.  She has a great-aunt with  type  2 diabetes.   REVIEW OF SYSTEMS:  GENERAL:  No fevers, chills, night sweats.  NEURO:  No seizures, syncope or paralysis.  RESPIRATORY:  No shortness of  breath, productive cough or hemoptysis.  CARDIOVASCULAR:  No chest pain,  angina, orthopnea.  GI:  She does have a history of lymphocytic colitis.  No nausea, vomiting, diarrhea, constipation.  GU:  No dysuria, hematuria  or discharge.  Musculoskeletal:  Right knee.   PHYSICAL EXAMINATION:  VITAL SIGNS:  Pulse 76, respirations 16, blood  pressure 134/78.  GENERAL:  A 55 year old white female well-nourished, well-developed, no  acute distress.  She is alert, oriented and cooperative, very pleasant.  Good historian.  HEENT:  Normocephalic and atraumatic.  Pupils round and reactive.  Oropharynx clear.  EOMs intact.  NECK:  Supple.  CHEST:  Clear.  HEART:  Regular rate and rhythm.  No murmur, S1-S2 noted.  ABDOMEN:  Soft, nontender.  Bowel sounds present.  RECTAL/BREASTS/GENITALIA:  Not done, not pertinent  to present illness.  EXTREMITIES:  Right knee:  Right knee shows range of motion 5-120,  marked crepitus.  Neurovascular intact.  Tender more medial than  lateral, no instability.   IMPRESSION:  1. Osteoarthritis right knee.  2. Headaches.  3. Mild depression.  4. Fibromyalgia.  5. Lymphocytic colitis.  6. Hemorrhoids.  7. History of urinary tract infections.  8. History of cystitis.  9. Anemia.  10.Postmenopausal.   PLAN:  The patient admitted to [facility] to undergo a right  total knee replacement arthroplasty.  Surgery will be performed by Dr.  Ollen Gross.  She has been seen by Dr. Smith Mince preoperatively and  felt to be cleared for upcoming surgery.      Alexzandrew L. Julien Girt, P.A.      Ollen Gross, M.D.  Electronically Signed    ALP/MEDQ  D:  04/19/2006  T:  04/19/2006  Job:  [id number]   cc:   Talmadge Coventry, M.D.  Fax: [phone]   WGNFAO ZHY QMVH, M.D.  Fax: [phone]   Ollen Gross,  M.D.  Fax: [phone]   Patient's chart

## 2010-06-07 NOTE — Op Note (Signed)
[name], Mary                ACCOUNT NO.:  [id number]   MEDICAL RECORD NO.:  [id number]          PATIENT TYPE:  AMB   LOCATION:  DSC                          FACILITY:  [facility]   PHYSICIAN:  Thera Flake., M.D.DATE OF BIRTH:  May 07, 1955   DATE OF PROCEDURE:  02/21/2004  DATE OF DISCHARGE:                                 OPERATIVE REPORT   REFERRING PHYSICIAN:  Talmadge Coventry, M.D.   INDICATIONS FOR PROCEDURE:  Right knee questionable meniscal tear.  Severe  pain after left knee arthroscopy, unable to ambulate effectively, thought to  be amenable to outpatient surgery.   PREOPERATIVE DIAGNOSIS:  Torn medial meniscus right knee.   POSTOPERATIVE DIAGNOSIS:  1.  Torn medial meniscus right knee.  2.  Torn lateral meniscus, posterior horn.  3.  Osteoarthritis tricompartmental.   OPERATION:  1.  Partial medial and lateral meniscectomies.  2.  Debridement chondroplasty (tricompartmental extensive), all for the      right knee.   SURGEON:  Dyke Brackett, M.D.   ANESTHESIA:  MAC.   DESCRIPTION OF PROCEDURE:  Arthroscope through inferolateral and  inferomedial portals.  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  It was  noticeably centrally and medially which were debrided.  Trochlea groove was  moderately involved but not as badly as the medial surface of the patella.  This was debrided separate from the medial and lateral compartment  extensively.  Medial meniscus showed a complex tear of the posterior horn  requiring resection of 20 to 25% of the meniscus substance.  There was  degenerative change surrounding this tear, actually a moderate amount of  degenerative change was accounted generalized grade III changes on the  femoral condyle which were debrided back to smooth edge more linear  involvement so to speak but also significant involvement of the tibial  plateau with grade III changes, no  grade IV changes appreciated.  Some were  changes more generalized though on the tibial plateau on the lateral side,  less involvement of the femur with a complex tear of the posterior portion  of the lateral meniscus requiring resection of 10 to 15% of the lateral  meniscus substance.  Again debridement chondroplasty of all three  compartments was carried out and there was more noticeable involvement of  the medial compartment on both sides of the joint where as there were more  isolated involvement of the patella and the tibial plateau with less  involvement of the trochlear groove and femur on the lateral side in the  central area respectively.  Again, extensive debridement  chondroplasty was carried as well as part of the medial and lateral  meniscectomy.  Knee drained preoperative fluid.  Portals closed with nylon.  Light compressive sterile dressing applied.  Knee injected with Marcaine and  ___________ 40 mg Depo-Medrol, taken to the recovery room in stable  condition.      WDC/MEDQ  D:  02/21/2004  T:  02/21/2004  Job:  [id number]   cc:   Talmadge Coventry, M.D.  [address]  [place]  [place] [address]  Fax: [phone]

## 2010-06-11 ENCOUNTER — Ambulatory Visit: Payer: 59 | Admitting: Physical Therapy

## 2010-07-15 ENCOUNTER — Observation Stay (HOSPITAL_COMMUNITY)
Admission: EM | Admit: 2010-07-15 | Discharge: 2010-07-16 | Disposition: A | Discharge status: Home / Self Care | Payer: 59 | Attending: Internal Medicine | Admitting: Internal Medicine

## 2010-07-15 ENCOUNTER — Emergency Department (HOSPITAL_COMMUNITY): Payer: 59

## 2010-07-15 DIAGNOSIS — Z79899 Other long term (current) drug therapy: Secondary | ICD-10-CM | POA: Insufficient documentation

## 2010-07-15 DIAGNOSIS — F3289 Other specified depressive episodes: Secondary | ICD-10-CM | POA: Insufficient documentation

## 2010-07-15 DIAGNOSIS — F329 Major depressive disorder, single episode, unspecified: Secondary | ICD-10-CM | POA: Insufficient documentation

## 2010-07-15 DIAGNOSIS — E669 Obesity, unspecified: Secondary | ICD-10-CM | POA: Insufficient documentation

## 2010-07-15 DIAGNOSIS — M1710 Unilateral primary osteoarthritis, unspecified knee: Secondary | ICD-10-CM | POA: Insufficient documentation

## 2010-07-15 DIAGNOSIS — E785 Hyperlipidemia, unspecified: Secondary | ICD-10-CM | POA: Insufficient documentation

## 2010-07-15 DIAGNOSIS — Z96659 Presence of unspecified artificial knee joint: Secondary | ICD-10-CM | POA: Insufficient documentation

## 2010-07-15 DIAGNOSIS — K219 Gastro-esophageal reflux disease without esophagitis: Secondary | ICD-10-CM | POA: Insufficient documentation

## 2010-07-15 DIAGNOSIS — Z780 Asymptomatic menopausal state: Secondary | ICD-10-CM | POA: Insufficient documentation

## 2010-07-15 DIAGNOSIS — Z981 Arthrodesis status: Secondary | ICD-10-CM | POA: Insufficient documentation

## 2010-07-15 DIAGNOSIS — M79609 Pain in unspecified limb: Secondary | ICD-10-CM | POA: Insufficient documentation

## 2010-07-15 DIAGNOSIS — R0789 Other chest pain: Principal | ICD-10-CM | POA: Insufficient documentation

## 2010-07-15 DIAGNOSIS — K900 Celiac disease: Secondary | ICD-10-CM | POA: Insufficient documentation

## 2010-07-15 DIAGNOSIS — R9431 Abnormal electrocardiogram [ECG] [EKG]: Secondary | ICD-10-CM | POA: Insufficient documentation

## 2010-07-15 DIAGNOSIS — IMO0001 Reserved for inherently not codable concepts without codable children: Secondary | ICD-10-CM | POA: Insufficient documentation

## 2010-07-15 DIAGNOSIS — R51 Headache: Secondary | ICD-10-CM | POA: Insufficient documentation

## 2010-07-15 DIAGNOSIS — K649 Unspecified hemorrhoids: Secondary | ICD-10-CM | POA: Insufficient documentation

## 2010-07-15 DIAGNOSIS — D649 Anemia, unspecified: Secondary | ICD-10-CM | POA: Insufficient documentation

## 2010-07-15 LAB — CBC
HCT: 42.2 % (ref 36.0–46.0)
MCH: 27.5 pg (ref 26.0–34.0)
MCV: 83.4 fL (ref 78.0–100.0)
Platelets: 304 10*3/uL (ref 150–400)
RBC: 5.06 MIL/uL (ref 3.87–5.11)
WBC: 8.9 10*3/uL (ref 4.0–10.5)

## 2010-07-15 LAB — COMPREHENSIVE METABOLIC PANEL
ALT: 31 U/L (ref 0–35)
BUN: 13 mg/dL (ref 6–23)
CO2: 25 mEq/L (ref 19–32)
Calcium: 10 mg/dL (ref 8.4–10.5)
GFR calc Af Amer: >60 mL/min (ref >60)
GFR calc non Af Amer: >60 mL/min (ref >60)
Glucose, Bld: 112 mg/dL — ABNORMAL HIGH (ref 70–99)
Sodium: 138 mEq/L (ref 135–145)

## 2010-07-15 LAB — DIFFERENTIAL
Basophils Absolute: 0.1 10*3/uL (ref 0.0–0.1)
Basophils Relative: 1 % (ref 0–1)
Eosinophils Relative: 5 % (ref 0–5)
Lymphs Abs: 3.4 10*3/uL (ref 0.7–4.0)
Monocytes Absolute: 0.6 10*3/uL (ref 0.1–1.0)
Neutro Abs: 4.3 10*3/uL (ref 1.7–7.7)
Neutrophils Relative %: 49 % (ref 43–77)

## 2010-07-15 LAB — CK TOTAL AND CKMB (NOT AT ARMC)
CK, MB: 4.8 ng/mL — ABNORMAL HIGH (ref 0.3–4.0)
Total CK: 319 U/L — ABNORMAL HIGH (ref 7–177)

## 2010-07-16 LAB — URINE DRUGS OF ABUSE SCREEN W ALC, ROUTINE (REF LAB)
Barbiturate Quant, Ur: NEGATIVE
Cocaine Metabolites: NEGATIVE
Marijuana Metabolite: NEGATIVE
Methadone: NEGATIVE
Opiate Screen, Urine: NEGATIVE
Propoxyphene: NEGATIVE

## 2010-07-16 LAB — BASIC METABOLIC PANEL
BUN: 9 mg/dL (ref 6–23)
CO2: 28 mEq/L (ref 19–32)
Glucose, Bld: 141 mg/dL — ABNORMAL HIGH (ref 70–99)

## 2010-07-16 LAB — CARDIAC PANEL(CRET KIN+CKTOT+MB+TROPI)
CK, MB: 4.4 ng/mL — ABNORMAL HIGH (ref 0.3–4.0)
Total CK: 235 U/L — ABNORMAL HIGH (ref 7–177)
Troponin I: <0.3 ng/mL (ref <0.30)

## 2010-07-16 LAB — LIPID PANEL
Cholesterol: 201 mg/dL — ABNORMAL HIGH (ref 0–200)
HDL: 44 mg/dL (ref >39)

## 2010-08-07 NOTE — Discharge Summary (Signed)
NAMEMARLENY, [name] NO.:  [id number]  MEDICAL RECORD NO.:  [id number]  LOCATION:  1436                         FACILITY:  [facility]  PHYSICIAN:  Ramiro Harvest, MD    DATE OF BIRTH:  1955-12-06  DATE OF ADMISSION:  07/15/2010 DATE OF DISCHARGE:  07/16/2010                        DISCHARGE SUMMARY    CARDIOLOGIST:  Pamella Pert, M.D.  PRIMARY CARE PHYSICIAN:  Dr. Laurann Montana of [facility].  DISCHARGE DIAGNOSES: 1. Chest pain of unknown etiology, improved. 2. Hyperlipidemia. 3. Fibromyalgia. 4. Osteoarthritis of the right knee. 5. History of headaches. 6. History of mild depression. 7. History of lymphocytic colitis. 8. Hemorrhoids. 9. Anemia. 10.Post menopausal. 11.Status post spinal fusion. 12.Status post knee replacements.  DISCHARGE MEDICATIONS: 1. Aspirin 81 mg p.o. daily. 2. Calcium carbonate plus D 500 mg/400 units p.o. t.i.d. 3. Cymbalta 60 mg p.o. q.h.s. 4. Fish oil 1 gram 2 capsules p.o. b.i.d. 5. Lidoderm patch TD on for 12 hours off for 12 hours as needed. 6. Manganese 50 mg p.o. daily. 7. Omeprazole 40 mg p.o. daily. 8. Relafen 750 mg p.o. b.i.d. p.r.n. 9. Vitamin C 500 mg p.o. daily. 10.Vitamin D3 1000 units p.o. daily. 11.Welchol 625 mg 3 tablets p.o. b.i.d.  DISPOSITION AND FOLLOWUP:  Patient has been scheduled for an outpatient stress test with Dr. Jacinto Halim at 9:45 a.m. on July 19, 2010 for outpatient follow-up on this chest pain and further evaluation and management. Patient is also to follow up with her PCP, Dr. Cliffton Asters in a 1-2 weeks post discharge.  On discharge, a BMET will need to be obtained to follow up on patient's electrolytes and renal function and if cardiac workup of the outpatient stress test is negative, patient might benefit from a GI referral for further evaluation and management if patient is continued to have chest pain.  CONSULTATIONS DONE:  A cardiology consultation was done.  Patient was seen in  consultation by Dr. Jacinto Halim on July 16, 2010.  PROCEDURES PERFORMED:  A chest x-ray was done on July 15, 2010 that shows no active disease.  BRIEF ADMISSION HISTORY AND PHYSICAL:  Mary [name] is a pleasant 54 year old Caucasian female with no significant prior cardiac history, presented to the ED with less than 5 minutes of substernal chest pain that went down to the back and both legs.  She stated that it last very briefly, less than 5 minutes and it went away on its own.  She presented to the ED because she was worried and hospice were asked to admit the patient.  Patient did not get sweaty.  Patient did endorse some diaphoresis with this.  Denied any fevers or recent illness.  No nausea. No vomiting.  No diarrhea.  No cough.  For the rest of admission history and physical, please see H&P dictated by Dr. Onalee Hua of job [phone].  HOSPITAL COURSE:  Atypical chest pain:  Patient was admitted with atypical chest pain.  She was placed on Lovenox.  Cardiac enzymes were cycled which were q.8h. x3, which were negative.  Patient was visuallyplaced on oral beta-blocker.  Patient had some intermittent chest pain the night of admission; however, on the day of discharge this had  somewhat improved.  A fasting lipid panel was obtained, which came back with a total cholesterol of 201, triglycerides of 167, HDL of 44, LDL of 124.  Cardiac enzymes, which were cycled were negative x3.  A cardiology consultation was obtained.  Patient was seen in consultation by Dr. Jacinto Halim of Cardiology on July 16, 2010 and it was felt at that point in time that patient's chest pain was might be atypical.  Patient was asymptomatic at the time of his interview.  It was felt that due to the negative cardiac enzymes and her symptomatic improvement, patient could be safely followed up as an outpatient.  A stress test has been arranged as an outpatient, which patient will have on Friday, July 19, 2010. Patient will be discharged on  omeprazole as well as aspirin 81 mg daily. We will follow up with Dr. Jacinto Halim as an outpatient for further evaluation of her chest pain for outpatient stress test.  If cardiac workup is negative, will follow-up with PCP.  PCP may recommend a GI referral for further evaluation and management.  The rest of the patient's chronic medical issues have remained stable throughout the hospitalization and patient will be discharged in stable and improved condition.  VITAL SIGNS ON DISCHARGE:  On the day of discharge vital signs, temperature 98.0, pulse of 69, respirations 18, blood pressure 118/78, satting 94% on room air.  DISCHARGE LABORATORY DATA:  Sodium 138, potassium 3.7, chloride 102, bicarb 28, glucose 141, BUN 9, creatinine 0.86, calcium of 9.5.  CBC with a white count of 8.9, hemoglobin 13.9, hematocrit 42.2 and a platelet count of 304,000.  This has been a pleasure taking care of Mary [name].     Ramiro Harvest, MD     DT/MEDQ  D:  07/16/2010  T:  07/16/2010  Job:  [id number]  cc:   Pamella Pert, MD Fax: [phone]  Aram Beecham Dr. Cliffton Asters  Electronically Signed by Ramiro Harvest MD on 08/07/2010 06:27:38 PM

## 2010-08-10 NOTE — Consult Note (Signed)
[name], [name] NO.:  [id number]  MEDICAL RECORD NO.:  [id number]  LOCATION:  1436                         FACILITY:  [facility]  PHYSICIAN:  Pamella Pert, MD DATE OF BIRTH:  05-07-1955  DATE OF CONSULTATION:  07/15/2010 DATE OF DISCHARGE:                                CONSULTATION   REFERRING PHYSICIAN:  Ramiro Harvest, MD  REASON FOR CONSULTATION:  Consultation requested for chest pain.  HISTORY:  [name] is a 55 year old pleasant female with history of fibromyalgia, history of degenerative joint disease status post bilateral knee replacements in the past who was admitted to the hospital last night pound 9:30 or 10 o'clock.  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  Then she suddenly felt severe pain in both her shoulders.  She felt very weak and tired.  She thought she should lay down.  At the same time, she felt some chest discomfort that lasted for about 3 to 4 minutes without any recurrence.  Because of this presentation, she discussed this with her friends and eventually came to the emergency room to be evaluated.  The patient states that since admission she did fine and has not had any significant discomfort.  She does state that last night she had abdominal discomfort and also she had headaches all night.  No nausea, vomiting or diaphoresis associated with her symptoms.  She denies any cough, denies any fever.  During the episode of arm discomfort, she did feel sweaty and diaphoretic.  Presently, she states that she is doing fine and has not had any symptoms.  REVIEW OF SYSTEMS:  She has fibromyalgia.  She was also diagnosed with celiac disease.  She has osteoarthritis status post bilateral knee replacement.  She has had back surgery which is chronic and had spinal fusion in the past in 2006.  She has had normal coronary arteries  by cardiac catheterization about 5 to 6 years ago done at [facility].  She is not a diabetic.  There is no other significant bowel or bladder disturbances.  No history of recent hemoptysis or melena or dark stools.  Other systems were negative.  MEDICATIONS:  Her present medications home included Cymbalta 60 mg once a day and Welchol sachet once per day.  ALLERGIES:  She is allergic to CELEBREX which causes her to have significant edema.  SOCIAL HISTORY:  She is single.  She does not use tobacco products. Drinks alcohol on a social basis.  No history of illicit drug use.  She has markedly sedentary lifestyle.  FAMILY HISTORY:  There is no history of premature heart disease in family.  PHYSICAL EXAMINATION:  GENERAL:  On physical exam, she is moderately built and obese.  She appears to be in no acute distress. VITAL SIGNS:  Include a temperature of 98.0, pulse is 78 beats per minute, respirations 16, blood pressure is 108/78 mmHg. CARDIAC EXAM:  S1, S2 normal without any gallops or murmur. CHEST EXAMINATION:  Clear without any added rhonchi.  Decreased breath sounds at the bases. ABDOMEN:  Obese with  pannus.  There is mild epigastric tenderness present.  There is no hepatosplenomegaly.  Bowel sounds heard in all 4 quadrants. EXTREMITIES:  Extremity examination revealed full range of movements without any edema.  Peripheral arterial examination was normal.  LABORATORY DATA:  Her CK, CK-MB and troponins have been negative x2. Her EKG demonstrates sinus rhythm, right axis deviation, right posterior fascicular block and poor R-wave progression.  Her CBC, BMP was within normal limits.  IMPRESSION: 1. Chest pain, appears to be very atypical.  Severe arm discomfort and     then she had some mild chest heaviness that lasted only for 2 to 3     minutes without any recurrence. 2. Abnormal EKG. 3. Obesity, probably morbid. 4. History of gastroesophageal reflux disease and  history of celiac     disease.  I suspect that her chest pain is also related to     gastroesophageal reflux disease.  RECOMMENDATIONS:  At this point, I feel comfortable enough to say that she can be discharged home.  She does need outpatient evaluation given her comorbidities that includes some markedly sedentary lifestyle, obesity, hyperlipidemia and abnormal EKG.  I will set this up in the outpatient basis.  I discussed the findings with Dr. Janee Morn and she will be discharged home on PPI along with aspirin 81 mg p.o. daily.  My office will be contacting her to make arrangements for her to have outpatient evaluation and visits with me.  At this point, no cardiac medications have been added to her present medical regimen.  I thank Dr. Janee Morn for asking me to see this pleasant patient.     Pamella Pert, MD     JRG/MEDQ  D:  07/16/2010  T:  07/16/2010  Job:  [id number]  cc:   Stacie Acres. Cliffton Asters, M.D. Fax: [phone]  Electronically Signed by Yates Decamp MD on 08/10/2010 01:48:57 PM

## 2010-08-13 ENCOUNTER — Other Ambulatory Visit (HOSPITAL_COMMUNITY)
Admission: RE | Admit: 2010-08-13 | Discharge: 2010-08-13 | Disposition: A | Discharge status: Home / Self Care | Payer: 59 | Source: Ambulatory Visit | Attending: Family Medicine | Admitting: Family Medicine

## 2010-08-13 ENCOUNTER — Other Ambulatory Visit: Payer: Self-pay | Admitting: Family Medicine

## 2010-08-13 DIAGNOSIS — Z0000 Encounter for general adult medical examination without abnormal findings: Secondary | ICD-10-CM | POA: Insufficient documentation

## 2010-08-14 ENCOUNTER — Other Ambulatory Visit (HOSPITAL_COMMUNITY): Payer: Self-pay | Admitting: Family Medicine

## 2010-08-14 DIAGNOSIS — N950 Postmenopausal bleeding: Secondary | ICD-10-CM

## 2010-08-15 NOTE — H&P (Signed)
Mary [name], Mary [name] NO.:  [id number]  MEDICAL RECORD NO.:  [id number]  LOCATION:  [facility]                         FACILITY:  [facility]  PHYSICIAN:  Tarry Kos, MD       DATE OF BIRTH:  [birth year]  DATE OF ADMISSION:  07/15/2010 DATE OF DISCHARGE:                             HISTORY & PHYSICAL   CHIEF COMPLAINT:  Chest pain.  HISTORY OF PRESENT ILLNESS:  Mary [name] is a pleasant 55 year old female with no prior cardiac history who presents to the emergency department with less than 5 minutes of substernal chest pain that went down the back of both of her arms.  She says this lasted very briefly, less than 5 minutes and then it went away on its own.  She came to the emergency department because she was worried and we are being asked to admit the patient for rule out.  She did get sweaty and diaphoretic with this. She denies any recent fevers or any recent illnesses.  No nausea, vomiting, diarrhea and no cough.  REVIEW OF SYSTEMS:  Otherwise negative.  PAST MEDICAL HISTORY:  Celiac disease, fibromyalgia, osteoarthritis, status post knee replacements, status post spinal fusion, history of lymphocytic colitis.  She says she had a heart catheterization she thinks about 5 or 6 years ago which was normal.  I cannot find this in our records.  She also says she had a stress test 5 or 6 years ago that was also normal, I cannot find those records either.  ALLERGIES:  CELEBREX causes swelling.  MEDICATIONS:  Cymbalta 60 mg a day, Welchol.  SOCIAL HISTORY:  She is a nonsmoker.  Occasional alcohol.  No IV drug abuse.  PHYSICAL EXAMINATION:  VITAL SIGNS:  O2 sats 99% on room air.  Blood pressure 121/82, however, when I am in the room her systolics were in the 160s; heart rate 77; respiratory rate 20; temperature 98.8. GENERAL:  She is alert and oriented x4, no apparent distress, cooperative and friendly. HEENT:  Extraocular muscles intact.  Pupils equal and reactive to  light. Oropharynx clear.  Mucous membranes moist. NECK:  No JVD, no carotid bruits. COR:  Regular rate and rhythm without murmurs or gallops. CHEST:  Clear to auscultation bilaterally.  No wheezing, rhonchi or rales. ABDOMEN:  Soft, nontender, nondistended.  Positive bowel sounds.  No hepatosplenomegaly. EXTREMITIES:  No clubbing, cyanosis or edema. PSYCH:  Normal mood and affect. NEURO:  No focal neurologic deficits. SKIN:  No rashes.  LABORATORY DATA:  Troponin is negative.  CK-MB is marginally elevated of 4.8.  White count is normal.  Hemoglobin is 13.9.  Electrolytes are normal.  BUN and creatinine are normal.  Chest x-ray is negative.  A 12- lead EKG is normal sinus rhythm without any acute ST-T wave changes.  ASSESSMENT AND PLAN:  This is 54-year female with atypical chest pain. 1. Atypical chest pain.  I am going to cover her with full-dose     Lovenox due to her marginally elevated CK-MB.  Her troponin is     negative.  Continue to serial her enzymes and we will stop the     Lovenox if they are all negative.  We will place her on Toprol 25     mg p.o. daily as she is slightly hypertensive and obviously     morphine as needed if any recurrence of pain.  I am going to also     obtain a 2-D echo.  Would recommend further outpatient cardiac     stress testing.  It would be nice if we can get her heart     catheterization, that was done 5 or 6 years ago; however, I will     leave that up to the daytime doctor.  I will also check a fasting     lipid panel in the morning and a urine drug screen. 2. History of fibromyalgia.  Continue her Cymbalta.  Also place her on     a baby aspirin. 3. The patient is full code.  Further recommendation pending overall     hospital course.          ______________________________ Tarry Kos, MD     RD/MEDQ  D:  07/16/2010  T:  07/16/2010  Job:  [id number]  Electronically Signed by Tarry Kos MD on 08/15/2010 11:48:05 AM

## 2010-08-16 ENCOUNTER — Other Ambulatory Visit (HOSPITAL_COMMUNITY): Payer: 59

## 2010-08-16 ENCOUNTER — Ambulatory Visit (HOSPITAL_COMMUNITY)
Admission: RE | Admit: 2010-08-16 | Discharge: 2010-08-16 | Disposition: A | Discharge status: Home / Self Care | Payer: 59 | Source: Ambulatory Visit | Attending: Family Medicine | Admitting: Family Medicine

## 2010-08-16 DIAGNOSIS — N950 Postmenopausal bleeding: Secondary | ICD-10-CM | POA: Insufficient documentation

## 2010-08-29 ENCOUNTER — Other Ambulatory Visit: Payer: Self-pay | Admitting: Obstetrics and Gynecology

## 2010-09-24 ENCOUNTER — Ambulatory Visit: Payer: 59 | Attending: Orthopaedic Surgery

## 2010-09-24 DIAGNOSIS — IMO0001 Reserved for inherently not codable concepts without codable children: Secondary | ICD-10-CM | POA: Insufficient documentation

## 2010-09-24 DIAGNOSIS — M2569 Stiffness of other specified joint, not elsewhere classified: Secondary | ICD-10-CM | POA: Insufficient documentation

## 2010-09-24 DIAGNOSIS — M25519 Pain in unspecified shoulder: Secondary | ICD-10-CM | POA: Insufficient documentation

## 2010-09-24 DIAGNOSIS — M542 Cervicalgia: Secondary | ICD-10-CM | POA: Insufficient documentation

## 2010-09-25 ENCOUNTER — Ambulatory Visit: Payer: 59 | Admitting: Physical Therapy

## 2010-10-01 ENCOUNTER — Ambulatory Visit: Payer: 59

## 2010-10-03 ENCOUNTER — Ambulatory Visit: Payer: 59

## 2010-10-07 ENCOUNTER — Ambulatory Visit: Payer: 59

## 2010-10-09 ENCOUNTER — Ambulatory Visit: Payer: 59

## 2011-04-04 ENCOUNTER — Other Ambulatory Visit: Payer: Self-pay | Admitting: Family Medicine

## 2011-04-04 DIAGNOSIS — Z1231 Encounter for screening mammogram for malignant neoplasm of breast: Secondary | ICD-10-CM

## 2011-05-02 ENCOUNTER — Other Ambulatory Visit: Payer: Self-pay

## 2011-05-08 ENCOUNTER — Ambulatory Visit
Admission: RE | Admit: 2011-05-08 | Discharge: 2011-05-08 | Disposition: A | Discharge status: Home / Self Care | Payer: 59 | Source: Ambulatory Visit | Attending: Family Medicine | Admitting: Family Medicine

## 2011-05-08 DIAGNOSIS — Z1231 Encounter for screening mammogram for malignant neoplasm of breast: Secondary | ICD-10-CM

## 2011-06-25 ENCOUNTER — Other Ambulatory Visit: Payer: Self-pay

## 2012-01-01 ENCOUNTER — Other Ambulatory Visit (HOSPITAL_COMMUNITY)
Admission: RE | Admit: 2012-01-01 | Discharge: 2012-01-01 | Disposition: A | Discharge status: Home / Self Care | Payer: 59 | Source: Ambulatory Visit | Attending: Obstetrics and Gynecology | Admitting: Obstetrics and Gynecology

## 2012-01-01 ENCOUNTER — Other Ambulatory Visit: Payer: Self-pay | Admitting: Obstetrics and Gynecology

## 2012-01-01 DIAGNOSIS — Z01419 Encounter for gynecological examination (general) (routine) without abnormal findings: Secondary | ICD-10-CM | POA: Insufficient documentation

## 2012-01-01 DIAGNOSIS — Z1151 Encounter for screening for human papillomavirus (HPV): Secondary | ICD-10-CM | POA: Insufficient documentation

## 2012-01-19 ENCOUNTER — Encounter (HOSPITAL_COMMUNITY): Payer: Self-pay | Admitting: Pharmacist

## 2012-01-23 ENCOUNTER — Other Ambulatory Visit: Payer: Self-pay | Admitting: Obstetrics and Gynecology

## 2012-01-23 ENCOUNTER — Encounter (HOSPITAL_COMMUNITY): Payer: Self-pay

## 2012-01-23 ENCOUNTER — Encounter (HOSPITAL_COMMUNITY)
Admission: RE | Admit: 2012-01-23 | Discharge: 2012-01-23 | Disposition: A | Discharge status: Home / Self Care | Payer: 59 | Source: Ambulatory Visit | Attending: Obstetrics and Gynecology | Admitting: Obstetrics and Gynecology

## 2012-01-23 HISTORY — DX: Depression, unspecified: F32.A

## 2012-01-23 HISTORY — DX: Nausea with vomiting, unspecified: R11.2

## 2012-01-23 HISTORY — DX: Celiac disease: K90.0

## 2012-01-23 HISTORY — DX: Major depressive disorder, single episode, unspecified: F32.9

## 2012-01-23 HISTORY — DX: Other specified postprocedural states: Z98.890

## 2012-01-23 HISTORY — DX: Fibromyalgia: M79.7

## 2012-01-23 LAB — CBC
HCT: 42.5 % (ref 36.0–46.0)
Hemoglobin: 13.7 g/dL (ref 12.0–15.0)
MCH: 27.3 pg (ref 26.0–34.0)
MCV: 84.7 fL (ref 78.0–100.0)
RBC: 5.02 MIL/uL (ref 3.87–5.11)

## 2012-01-23 NOTE — Patient Instructions (Addendum)
20 KENLY HENCKEL  01/23/2012   Your procedure is scheduled on:  01/28/12  Enter through the Main Entrance of [facility] at 930 AM.  Pick up the phone at the desk and dial [phone].   Call this number if you have problems the morning of surgery: [phone]   Remember:   Do not eat food:After Midnight.  Do not drink clear liquids: After Midnight.  Take these medicines the morning of surgery with A SIP OF WATER: NA   Do not wear jewelry, make-up or nail polish.  Do not wear lotions, powders, or perfumes. You may wear deodorant.  Do not shave 48 hours prior to surgery.  Do not bring valuables to the hospital.  Contacts, dentures or bridgework may not be worn into surgery.  Leave suitcase in the car. After surgery it may be brought to your room.  For patients admitted to the hospital, checkout time is 11:00 AM the day of discharge.   Patients discharged the day of surgery will not be allowed to drive home.  Name and phone number of your driver: NA  Special Instructions: Shower using CHG 2 nights before surgery and the night before surgery.  If you shower the day of surgery use CHG.  Use special wash - you have one bottle of CHG for all showers.  You should use approximately 1/3 of the bottle for each shower.   Please read over the following fact sheets that you were given: Surgical Site Infection Prevention

## 2012-01-27 MED ORDER — DEXTROSE 5 % IV SOLN
2.0000 g | INTRAVENOUS | Status: AC
  Administered 2012-01-28: 2 g via INTRAVENOUS
  Filled 2012-01-27: qty 2

## 2012-01-27 NOTE — H&P (Signed)
01/07/2012  History of Present Illness  General:  57 y/o G2P2 presents to review an ultrasound due to h/o postmenopausal bleeding. Pt s/p EMB over 1 yr ago, pathology was benign proliferative endometrium. Pt did not have another episode of bleeding for over 1 year.  Ultrasound today shows a thin EMS, 3.7 mm, but a small lesion 5 mm was seen in the posterior portion of the endometrial cavity. Pt agreeable EMB today.   Current Medications  Flexeril 10 MG Tablet 1 tablet rarely  Methocarbamol 500 MG Tablet 1 tablet rarely  Nabumetone 750 MG Tablet 1 tablet rarely  Multivitamins Tablet as directed occ  Fish Oil Capsule 1 capsule with a meal occ  Vitamin D3 5000 units Capsule 1 capsule occ  Aspirin 81 MG Tablet Chewable 1 tablet occ  Vitamin B-1 100 MG Tablet 1 tablet occ  ProAir HFA 108 (90 Base) MCG/ACT Aerosol Solution 2 puffs as needed for wheezing rarely  Zyrtec Allergy 10 MG Tablet 1 tablet Once a day  Claritin 10 MG Tablet 1 tablet Once a day  Cymbalta 60 MG Capsule Delayed Release Particles TAKE 1 CAPSULE BY MOUTH ONCE DAILY   Minocycline HCl 100 MG Capsule 1 capsule every 12 hrs   Past Medical History  fibromyalgia, has seen Dr. Titus Dubin in the past  Celiac disease  Lymphocyctic colitis  spine fusionn in neck s/p fx, C3-C4  Knee replacement x 2  Cyst removed from rectum  Gestatitional diabetes  Allergies, shell fish  History of cervical dysplasia, s/o cryo 03  Degenerative joint disease  Depression  Hypercholesterolemia  Migraines  History of anemia, iron malabsorption  Osteopenia  Hemorrhoids  Vitamin D deficiency  Cold sores  Food allergies   Surgical History  spinal fusion, C3/4 1974  dermoid cyst removal from rectum 1979  knee replacement,left and right,Dr. Despina Hick [id number]  right and left meniscectomies, Dr. Christian Mate 2006   Family History  Father: deceased 76 yrs leukemia   Mother: alive colon polyps   Paternal Grand Father: deceased   Paternal Grand  Mother: deceased   Maternal Grand Father: deceased   Maternal Grand Mother: deceased breast cancer   Brother 1: alive adopted   Paternal uncle: colon CA   Maternal uncle: colon cancer, 2 uncles   colon cancer in both sides of the family.   Social History  General:  History of smoking  cigarettes: Never smoked no Smoking.  Alcohol: yes, Rare, 1- per week.  no Recreational drug use.  Exercise: yes, 2-3 times per week.  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  Marital Status: single, Divorced, not in a relationship.  Children: 1, Boys, Recruitment consultant- (24),, 1, girls Amy- (21).  Religion: Public librarian.  Seat belt use: yes.    Gyn History  Periods : postmenopausal.  LMP bleeding in early December and bled for 6-7 days and bright pink/red.  Denies H/O Birth control .  Last pap smear date 08/13/10, WNL.  Last mammogram date 05/08/11 DX mammo.  Abnormal pap smear assessed with colposcopy, treated with cryo.    OB History  Number of pregnancies 2.  Pregnancy # 1 live birth, vaginal delivery, boy.  Pregnancy # 2 live birth, vaginal delivery, girl.    Allergies  Celebrex: swelling  Ibuprofen: stomach issues  shellfish   Hospitalization/Major Diagnostic Procedure  childbirth 86, 90   Vital Signs  Wt 246, Ht 63, BMI 43.57, Pulse sitting 87, BP sitting 122/82.   Physical Examination  GENERAL:  Patient appears in NAD, pleasant.  Build: well developed.  General Appearance: well-appearing, obese.  Race: caucasian.  NECK:  ROM: normal.  Thyroid: no thyromegaly, non tender.  LUNGS:  Breath sounds: clear to auscultation.  Dyspnea: no.  HEART:  Murmurs: none.  Rate: normal.  Rhythm: regular.  ABDOMEN:  General: no masses,tenderness,organomegaly, , BS normal, non distended.  FEMALE GENITOURINARY:  Adnexa: no mass, non tender.  Anus/perineum: normal, no lesions.  Cervix/ cuff: normal appearance , no lesions/discharge/bleeding, good pelvic support .  External genitalia:  normal, no lesions, no skin discoloration.  Rectum: deferred.  Urethra: normal external meatus.  Uterus: normal size/shape/consistency, freely mobile, non tender.  Vagina: pink/moist mucosa, no lesions, no abnormal discharge, odorless.  Vulva: normal, no lesions, no skin discoloration.  EXTREMITIES:  Extremities no clubbing cyanosis or edema present.  NEUROLOGICAL:  gross motor and sensory grossly intact.  Orientation: alert and oriented x 3.  Bimanual done 12/12.   Assessments   1. Postmenopausal bleeding - 627.1 (Primary), Now with questionable endometrial mass. Failed office EMB.   Treatment  1. Postmenopausal bleeding  Diagnostic Imaging:US ECHO TRANSVAGINAL  EMS is thin but due to finding of mass recommend endometrial sampling via Hyst/D&C due to failed office EMB. R/B/A and rationale of procedure d/w pt at length. Pt desires to proceed.  Referral To:[facility] - Gynecology Reason:Precert and schedule Hyst/D&C-Failed office EMB, PMB    Procedure Codes  09811 ECHO TRANSVAGINAL   Follow Up  2 Weeks post op

## 2012-01-28 ENCOUNTER — Ambulatory Visit (HOSPITAL_COMMUNITY): Payer: 59 | Admitting: Anesthesiology

## 2012-01-28 ENCOUNTER — Encounter (HOSPITAL_COMMUNITY): Payer: Self-pay | Admitting: Anesthesiology

## 2012-01-28 ENCOUNTER — Encounter (HOSPITAL_COMMUNITY)
Admission: RE | Disposition: A | Discharge status: Home / Self Care | Payer: Self-pay | Source: Ambulatory Visit | Attending: Obstetrics and Gynecology

## 2012-01-28 ENCOUNTER — Ambulatory Visit (HOSPITAL_COMMUNITY)
Admission: RE | Admit: 2012-01-28 | Discharge: 2012-01-28 | Disposition: A | Discharge status: Home / Self Care | Payer: 59 | Source: Ambulatory Visit | Attending: Obstetrics and Gynecology | Admitting: Obstetrics and Gynecology

## 2012-01-28 DIAGNOSIS — Z01812 Encounter for preprocedural laboratory examination: Secondary | ICD-10-CM | POA: Insufficient documentation

## 2012-01-28 DIAGNOSIS — Z01818 Encounter for other preprocedural examination: Secondary | ICD-10-CM | POA: Insufficient documentation

## 2012-01-28 DIAGNOSIS — N8189 Other female genital prolapse: Secondary | ICD-10-CM | POA: Insufficient documentation

## 2012-01-28 DIAGNOSIS — N950 Postmenopausal bleeding: Secondary | ICD-10-CM | POA: Insufficient documentation

## 2012-01-28 DIAGNOSIS — N9489 Other specified conditions associated with female genital organs and menstrual cycle: Secondary | ICD-10-CM | POA: Insufficient documentation

## 2012-01-28 HISTORY — PX: HYSTEROSCOPY WITH D & C: SHX1775

## 2012-01-28 SURGERY — DILATATION AND CURETTAGE /HYSTEROSCOPY
Anesthesia: General | Site: Vagina | Wound class: Clean Contaminated

## 2012-01-28 MED ORDER — LIDOCAINE HCL 2 % IJ SOLN
INTRAMUSCULAR | Status: AC
  Filled 2012-01-28: qty 20

## 2012-01-28 MED ORDER — LIDOCAINE HCL 2 % IJ SOLN
INTRAMUSCULAR | Status: DC | PRN
  Administered 2012-01-28: 10 mL

## 2012-01-28 MED ORDER — LACTATED RINGERS IV SOLN
INTRAVENOUS | Status: DC
  Administered 2012-01-28 (×3): via INTRAVENOUS

## 2012-01-28 MED ORDER — MIDAZOLAM HCL 2 MG/2ML IJ SOLN
INTRAMUSCULAR | Status: AC
  Filled 2012-01-28: qty 2

## 2012-01-28 MED ORDER — LIDOCAINE-EPINEPHRINE (PF) 2 %-1:200000 IJ SOLN
INTRAMUSCULAR | Status: AC
  Filled 2012-01-28: qty 20

## 2012-01-28 MED ORDER — FENTANYL CITRATE 0.05 MG/ML IJ SOLN
INTRAMUSCULAR | Status: AC
  Administered 2012-01-28: 25 ug via INTRAVENOUS
  Filled 2012-01-28: qty 2

## 2012-01-28 MED ORDER — DEXAMETHASONE SODIUM PHOSPHATE 4 MG/ML IJ SOLN
INTRAMUSCULAR | Status: DC | PRN
  Administered 2012-01-28: 4 mg via INTRAVENOUS

## 2012-01-28 MED ORDER — FENTANYL CITRATE 0.05 MG/ML IJ SOLN
INTRAMUSCULAR | Status: AC
  Filled 2012-01-28: qty 4

## 2012-01-28 MED ORDER — MIDAZOLAM HCL 5 MG/5ML IJ SOLN
INTRAMUSCULAR | Status: DC | PRN
  Administered 2012-01-28 (×2): 1 mg via INTRAVENOUS

## 2012-01-28 MED ORDER — FENTANYL CITRATE 0.05 MG/ML IJ SOLN
25.0000 ug | INTRAMUSCULAR | Status: DC | PRN
  Administered 2012-01-28: 25 ug via INTRAVENOUS

## 2012-01-28 MED ORDER — SILVER NITRATE-POT NITRATE 75-25 % EX MISC
CUTANEOUS | Status: DC | PRN
  Administered 2012-01-28: 1

## 2012-01-28 MED ORDER — ONDANSETRON HCL 4 MG/2ML IJ SOLN
INTRAMUSCULAR | Status: AC
  Filled 2012-01-28: qty 2

## 2012-01-28 MED ORDER — FENTANYL CITRATE 0.05 MG/ML IJ SOLN
INTRAMUSCULAR | Status: AC
  Filled 2012-01-28: qty 2

## 2012-01-28 MED ORDER — GLYCOPYRROLATE 0.2 MG/ML IJ SOLN
INTRAMUSCULAR | Status: AC
  Filled 2012-01-28: qty 1

## 2012-01-28 MED ORDER — LIDOCAINE HCL (CARDIAC) 20 MG/ML IV SOLN
INTRAVENOUS | Status: AC
  Filled 2012-01-28: qty 5

## 2012-01-28 MED ORDER — ONDANSETRON HCL 4 MG/2ML IJ SOLN
INTRAMUSCULAR | Status: DC | PRN
  Administered 2012-01-28: 4 mg via INTRAVENOUS

## 2012-01-28 MED ORDER — SODIUM BICARBONATE 8.4 % IV SOLN
INTRAVENOUS | Status: AC
  Filled 2012-01-28: qty 50

## 2012-01-28 MED ORDER — LIDOCAINE HCL (CARDIAC) 20 MG/ML IV SOLN
INTRAVENOUS | Status: DC | PRN
  Administered 2012-01-28: 80 mg via INTRAVENOUS

## 2012-01-28 MED ORDER — FENTANYL CITRATE 0.05 MG/ML IJ SOLN
INTRAMUSCULAR | Status: DC | PRN
  Administered 2012-01-28 (×2): 50 ug via INTRAVENOUS

## 2012-01-28 MED ORDER — OXYCODONE-ACETAMINOPHEN 5-325 MG PO TABS: ORAL_TABLET | ORAL | Status: DC

## 2012-01-28 MED ORDER — PROPOFOL 10 MG/ML IV EMUL
INTRAVENOUS | Status: DC | PRN
  Administered 2012-01-28: 200 mg via INTRAVENOUS

## 2012-01-28 MED ORDER — DEXAMETHASONE SODIUM PHOSPHATE 10 MG/ML IJ SOLN
INTRAMUSCULAR | Status: AC
  Filled 2012-01-28: qty 1

## 2012-01-28 MED ORDER — PROPOFOL 10 MG/ML IV EMUL
INTRAVENOUS | Status: AC
  Filled 2012-01-28: qty 20

## 2012-01-28 SURGICAL SUPPLY — 14 items
CANISTER SUCTION 2500CC (MISCELLANEOUS) ×2 IMPLANT
CATH ROBINSON RED A/P 16FR (CATHETERS) ×2 IMPLANT
CLOTH BEACON ORANGE TIMEOUT ST (SAFETY) ×2 IMPLANT
CONTAINER PREFILL 10% NBF 60ML (FORM) ×3 IMPLANT
DILATOR CANAL MILEX (MISCELLANEOUS) ×1 IMPLANT
DRESSING TELFA 8X3 (GAUZE/BANDAGES/DRESSINGS) ×2 IMPLANT
GLOVE BIO SURGEON STRL SZ7 (GLOVE) ×2 IMPLANT
GLOVE BIOGEL PI IND STRL 7.0 (GLOVE) ×2 IMPLANT
GLOVE BIOGEL PI INDICATOR 7.0 (GLOVE) ×2
GOWN STRL REIN XL XLG (GOWN DISPOSABLE) ×4 IMPLANT
PACK HYSTEROSCOPY LF (CUSTOM PROCEDURE TRAY) ×2 IMPLANT
PAD OB MATERNITY 4.3X12.25 (PERSONAL CARE ITEMS) ×2 IMPLANT
TOWEL OR 17X24 6PK STRL BLUE (TOWEL DISPOSABLE) ×4 IMPLANT
WATER STERILE IRR 1000ML POUR (IV SOLUTION) ×2 IMPLANT

## 2012-01-28 NOTE — Preoperative (Signed)
Beta Blockers   Reason not to administer Beta Blockers:Not Applicable 

## 2012-01-28 NOTE — Transfer of Care (Signed)
Immediate Anesthesia Transfer of Care Note  Patient: Mary [name]  Procedure(s) Performed: Procedure(s) (LRB) with comments: DILATATION AND CURETTAGE /HYSTEROSCOPY (N/A)  Patient Location: PACU  Anesthesia Type:General  Level of Consciousness: awake, alert , oriented and patient cooperative  Airway & Oxygen Therapy: Patient Spontanous Breathing and Patient connected to nasal cannula oxygen  Post-op Assessment: Report given to PACU RN and Post -op Vital signs reviewed and stable  Post vital signs: Reviewed and stable  Complications: No apparent anesthesia complications

## 2012-01-28 NOTE — Interval H&P Note (Signed)
History and Physical Interval Note:  01/28/2012 10:43 AM  Mary [name]  has presented today for surgery, with the diagnosis of PMB  The various methods of treatment have been discussed with the patient and family. After consideration of risks, benefits and other options for treatment, the patient has consented to  Procedure(s) (LRB) with comments: DILATATION AND CURETTAGE /HYSTEROSCOPY (N/A) as a surgical intervention .  The patient's history has been reviewed, patient examined, no change in status, stable for surgery.  I have reviewed the patient's chart and labs.  Questions were answered to the patient's satisfaction.    Pt reports a constant ache around the introitus.  Not worsened by palpation. Does not feel like burning. Pt previously had suprapubic pain and Urine culture was negative. Pt examined at bedside in attempt to localize pain while awake.  No abnormalities seen.  Will do a more thorough evaluation under anesthesia.  Dion Body, Cindy Brindisi

## 2012-01-28 NOTE — Brief Op Note (Signed)
01/28/2012  12:00 PM  PATIENT:  Mary [name]  57 y.o. female  PRE-OPERATIVE DIAGNOSIS:  PMB  [name]-OPERATIVE DIAGNOSIS:  [name] menopausal bleeding  PROCEDURE:  Procedure(s) (LRB) with comments: DILATATION AND CURETTAGE /HYSTEROSCOPY (N/A), cervical biopsy  SURGEON:  Surgeon(s) and Role:    * Geryl [facility]ins, MD - Primary  PHYSICIAN ASSISTANT: None  ASSISTANTS: Technician   ANESTHESIA:   general  EBL:  Total I/O In: 1000 [I.V.:1000] Out: 25 [Urine:25]  BLOOD ADMINISTERED:none  DRAINS: none   LOCAL MEDICATIONS USED: 2% LIDOCAINE 10 cc  SPECIMEN:  Source of Specimen:  Cervical biopsy at 4 o'clock, endometrial currettings  DISPOSITION OF SPECIMEN:  PATHOLOGY  COUNTS:  YES  TOURNIQUET:  * No tourniquets in log *  DICTATION: .Other Dictation: Dictation Number [id number]  PLAN OF CARE: Discharge to home after PACU  PATIENT DISPOSITION:  PACU - hemodynamically stable.   Delay start of Pharmacological VTE agent (>24hrs) due to surgical blood loss or risk of bleeding: not applicable

## 2012-01-28 NOTE — Anesthesia Preprocedure Evaluation (Signed)
Anesthesia Evaluation  Patient identified by MRN, date of birth, ID band Patient awake    Reviewed: Allergy & Precautions, H&P , NPO status , Patient's Chart, lab work & pertinent test results, reviewed documented beta blocker date and time   History of Anesthesia Complications (+) PONV  Airway Mallampati: I TM Distance: >3 FB Neck ROM: full    Dental  (+) Teeth Intact   Pulmonary neg pulmonary ROS, neg sleep apnea,  breath sounds clear to auscultation  Pulmonary exam normal       Cardiovascular Exercise Tolerance: Good negative cardio ROS  Rhythm:regular Rate:Normal     Neuro/Psych PSYCHIATRIC DISORDERS (depression) negative neurological ROS     GI/Hepatic Neg liver ROS, Celiac disease   Endo/Other  Morbid obesity  Renal/GU negative Renal ROS  Female GU complaint     Musculoskeletal  (+) Fibromyalgia -  Abdominal   Peds  Hematology negative hematology ROS (+)   Anesthesia Other Findings Can take ibuprofen - avoids because can upset her stomach  Reproductive/Obstetrics negative OB ROS                           Anesthesia Physical Anesthesia Plan  ASA: III  Anesthesia Plan: General LMA   Post-op Pain Management:    Induction:   Airway Management Planned:   Additional Equipment:   Intra-op Plan:   Post-operative Plan:   Informed Consent: I have reviewed the patients History and Physical, chart, labs and discussed the procedure including the risks, benefits and alternatives for the proposed anesthesia with the patient or authorized representative who has indicated his/her understanding and acceptance.   Dental Advisory Given  Plan Discussed with: CRNA and Surgeon  Anesthesia Plan Comments:         Anesthesia Quick Evaluation

## 2012-01-28 NOTE — Anesthesia Postprocedure Evaluation (Signed)
Anesthesia Post Note  Patient: Mary [name]  Procedure(s) Performed: Procedure(s) (LRB): DILATATION AND CURETTAGE /HYSTEROSCOPY (N/A)  Anesthesia type: General  Patient location: PACU  Post pain: Pain level controlled  Post assessment: Post-op Vital signs reviewed  Last Vitals:  Filed Vitals:   01/28/12 1300  BP: 140/86  Pulse: 88  Temp: 36.8 C  Resp: 15    Post vital signs: Reviewed  Level of consciousness: sedated  Complications: No apparent anesthesia complications

## 2012-01-29 ENCOUNTER — Encounter (HOSPITAL_COMMUNITY): Payer: Self-pay | Admitting: Obstetrics and Gynecology

## 2012-01-29 NOTE — Op Note (Signed)
NAMEILEA, HILTON NO.:  [id number]  MEDICAL RECORD NO.:  [id number]  LOCATION:  [facility]                          FACILITY:  [facility]  PHYSICIAN:  Pieter Partridge, MD   DATE OF BIRTH:  09-13-1955  DATE OF PROCEDURE:  01/28/2012 DATE OF DISCHARGE:  01/28/2012                              OPERATIVE REPORT   PREOPERATIVE DIAGNOSES: 1. Postmenopausal bleeding. 2. Failed office endometrial biopsy.  POSTOPERATIVE DIAGNOSES: 1. Postmenopausal bleeding. 2. Cervical lesion.  PROCEDURES:  Hysteroscopy, D and C, and cervical biopsies.  SURGEON:  Pieter Partridge, MD  ASSISTANT:  Technician.  ANESTHESIA:  General.  Local with 10 mL of 2% lidocaine.  ESTIMATED BLOOD LOSS:  May be less than 25 mL.  IV FLUIDS:  In 1000.  URINE OUTPUT:  25 mL prior to procedure.  DRAINS:  None.  SPECIMENS:  Cervical biopsy at 4 o'clock and endometrial curettings to pathology.  COMPLICATIONS:  None.  DISPOSITION:  To PACU, hemodynamically stable.  FINDINGS:  Ecchymotic lesion at 4 o'clock on the cervix, similar appearing lesion around the cervical os at 11 o'clock, but much smaller. Significant pelvic prolapse.  Vaginal introitus and vulvar appears to be normal without any lesions.  The introitus does appear little red or slightly erythematous, but no discrete lesions are noted.  Uterus sounded to 7 cm.  Endometrial cavity with what appears to be atrophic endometrium friable tissue.  No discrete lesions observed.  Endometrial cavity is normal in shape and size.  DESCRIPTION OF PROCEDURE:  The patient was taken to the operating room after being identified in the holding area.  She underwent general endotracheal anesthesia without complication and was prepped and draped in normal sterile fashion after being placed in the dorsal lithotomy position.  In the holding area, preoperatively, the patient reported just having a constant ache around the vaginal introitus.  Prior  to inserting the speculum, a thorough examination of the introitus, vulva, perineum was performed.  It appeared to be normal, no lesions were seen. There was slight erythema on the right labia just above the perineal body, but it was in a very minor.  A Graves speculum was inserted due to redundant vaginal walls and vaginal length.  Longer Graves was used to identify the cervix.  About 2 mL of 2% lidocaine was injected in the anterior cervical lip.  Single- tooth tenaculum was then used to grasp the cervix and a paracervical block was performed with the remaining 2% lidocaine for a total of 10 mL.  The os finder was then inserted into the cervical canal and the cervix was dilated very easily.  The hysteroscope was then advanced through the cervix into the uterine fundus with the above findings noted.  I attempted to do a focal biopsy, but the area of concern was just friable and was not discrete, also the tissue was very thin and kind of appearing apart.  Next, I decided just to proceed with the curettage of all 4 quadrants, and there was scant tissue returned, more blood clot than anything.  Prior to performing the procedure, hysteroscopy, D and C, cervical biopsy was done of this ecchymotic lesion about 2 cm  above, maybe 6 mm. Biopsy forceps were used to grasp the tissue and bleeding noted at the site.  Eventually, it was hemostatic with tamponade and silver nitrate.  All instrument, sponge, and needle counts were correct x3.  The patient tolerated the procedure well.  She was taken to the recovery room in stable condition.     Pieter Partridge, MD     EBV/MEDQ  D:  01/28/2012  T:  01/29/2012  Job:  [id number]

## 2012-02-17 ENCOUNTER — Other Ambulatory Visit: Payer: Self-pay | Admitting: Obstetrics and Gynecology

## 2012-02-17 DIAGNOSIS — R102 Pelvic and perineal pain: Secondary | ICD-10-CM

## 2012-02-20 ENCOUNTER — Other Ambulatory Visit: Payer: Self-pay | Admitting: Obstetrics and Gynecology

## 2012-02-20 ENCOUNTER — Ambulatory Visit
Admission: RE | Admit: 2012-02-20 | Discharge: 2012-02-20 | Disposition: A | Discharge status: Home / Self Care | Payer: 59 | Source: Ambulatory Visit | Attending: Obstetrics and Gynecology | Admitting: Obstetrics and Gynecology

## 2012-02-20 DIAGNOSIS — R102 Pelvic and perineal pain: Secondary | ICD-10-CM

## 2012-02-20 MED ORDER — IOHEXOL 300 MG/ML  SOLN
125.0000 mL | Freq: Once | INTRAMUSCULAR | Status: AC | PRN
  Administered 2012-02-20: 125 mL via INTRAVENOUS

## 2012-03-06 ENCOUNTER — Other Ambulatory Visit: Payer: Self-pay

## 2012-07-02 ENCOUNTER — Other Ambulatory Visit: Payer: Self-pay

## 2012-07-02 DIAGNOSIS — Z1231 Encounter for screening mammogram for malignant neoplasm of breast: Secondary | ICD-10-CM

## 2012-08-03 ENCOUNTER — Ambulatory Visit
Admission: RE | Admit: 2012-08-03 | Discharge: 2012-08-03 | Disposition: A | Discharge status: Home / Self Care | Payer: 59 | Source: Ambulatory Visit

## 2012-08-03 DIAGNOSIS — Z1231 Encounter for screening mammogram for malignant neoplasm of breast: Secondary | ICD-10-CM

## 2012-11-25 ENCOUNTER — Other Ambulatory Visit (HOSPITAL_COMMUNITY)
Admission: RE | Admit: 2012-11-25 | Discharge: 2012-11-25 | Disposition: A | Discharge status: Home / Self Care | Payer: 59 | Source: Ambulatory Visit | Attending: Obstetrics and Gynecology | Admitting: Obstetrics and Gynecology

## 2012-11-25 ENCOUNTER — Other Ambulatory Visit: Payer: Self-pay

## 2012-11-25 ENCOUNTER — Other Ambulatory Visit: Payer: Self-pay | Admitting: Obstetrics and Gynecology

## 2012-11-25 DIAGNOSIS — Z01419 Encounter for gynecological examination (general) (routine) without abnormal findings: Secondary | ICD-10-CM | POA: Insufficient documentation

## 2012-12-28 ENCOUNTER — Other Ambulatory Visit: Payer: Self-pay | Admitting: Family Medicine

## 2012-12-28 DIAGNOSIS — M8580 Other specified disorders of bone density and structure, unspecified site: Secondary | ICD-10-CM

## 2013-02-01 ENCOUNTER — Ambulatory Visit
Admission: RE | Admit: 2013-02-01 | Discharge: 2013-02-01 | Disposition: A | Discharge status: Home / Self Care | Payer: 59 | Source: Ambulatory Visit | Attending: Family Medicine | Admitting: Family Medicine

## 2013-02-01 DIAGNOSIS — M8580 Other specified disorders of bone density and structure, unspecified site: Secondary | ICD-10-CM

## 2013-02-07 ENCOUNTER — Other Ambulatory Visit (HOSPITAL_COMMUNITY): Payer: Self-pay | Admitting: Family Medicine

## 2013-02-07 DIAGNOSIS — R911 Solitary pulmonary nodule: Secondary | ICD-10-CM

## 2013-02-11 ENCOUNTER — Encounter (HOSPITAL_COMMUNITY): Payer: Self-pay

## 2013-02-11 ENCOUNTER — Ambulatory Visit (HOSPITAL_COMMUNITY)
Admission: RE | Admit: 2013-02-11 | Discharge: 2013-02-11 | Disposition: A | Discharge status: Home / Self Care | Payer: 59 | Source: Ambulatory Visit | Attending: Family Medicine | Admitting: Family Medicine

## 2013-02-11 DIAGNOSIS — R911 Solitary pulmonary nodule: Secondary | ICD-10-CM | POA: Insufficient documentation

## 2013-03-08 ENCOUNTER — Encounter (HOSPITAL_COMMUNITY): Payer: Self-pay | Admitting: Pharmacist

## 2013-03-24 ENCOUNTER — Encounter (HOSPITAL_COMMUNITY): Payer: Self-pay

## 2013-03-24 ENCOUNTER — Encounter (HOSPITAL_COMMUNITY)
Admission: RE | Admit: 2013-03-24 | Discharge: 2013-03-24 | Disposition: A | Discharge status: Home / Self Care | Payer: 59 | Source: Ambulatory Visit | Attending: Obstetrics and Gynecology | Admitting: Obstetrics and Gynecology

## 2013-03-24 DIAGNOSIS — Z01812 Encounter for preprocedural laboratory examination: Secondary | ICD-10-CM | POA: Insufficient documentation

## 2013-03-24 HISTORY — DX: Gastro-esophageal reflux disease without esophagitis: K21.9

## 2013-03-24 HISTORY — DX: Unspecified osteoarthritis, unspecified site: M19.90

## 2013-03-24 LAB — CBC
HCT: 41.1 % (ref 36.0–46.0)
Hemoglobin: 13.5 g/dL (ref 12.0–15.0)
MCH: 27.3 pg (ref 26.0–34.0)
MCHC: 32.8 g/dL (ref 30.0–36.0)
MCV: 83.2 fL (ref 78.0–100.0)
PLATELETS: 294 10*3/uL (ref 150–400)
RBC: 4.94 MIL/uL (ref 3.87–5.11)
RDW: 14.9 % (ref 11.5–15.5)
WBC: 8.4 10*3/uL (ref 4.0–10.5)

## 2013-03-24 NOTE — Patient Instructions (Addendum)
   Your procedure is scheduled on:  Wednesday, Mar 11  Enter through the [employer] [facility] at:  10 AM Pick up the phone at the desk and dial [phone] and inform us of your arrival.  Please call this number if you have any problems the morning of surgery: [phone]  Remember: Do not eat food after midnight: Tuesday Do not drink clear liquids after: 730 AM Wednesday, day of surgery Take these medicines the morning of surgery with a SIP OF WATER:  None  Do not wear jewelry, make-up, or FINGER nail polish No metal in your hair or on your body. Do not wear lotions, powders, perfumes.  You may wear deodorant.  Do not bring valuables to the hospital. Contacts, dentures or bridgework may not be worn into surgery.  Leave suitcase in the car. After Surgery it may be brought to your room. For patients being admitted to the hospital, checkout time is 11:00am the day of discharge.  Home with mother Mary [name].

## 2013-03-29 MED ORDER — DEXTROSE 5 % IV SOLN
2.0000 g | INTRAVENOUS | Status: AC
  Administered 2013-03-30: 2 g via INTRAVENOUS
  Filled 2013-03-29: qty 2

## 2013-03-30 ENCOUNTER — Encounter (HOSPITAL_COMMUNITY): Payer: Self-pay

## 2013-03-30 ENCOUNTER — Encounter (HOSPITAL_COMMUNITY): Payer: 59 | Admitting: Anesthesiology

## 2013-03-30 ENCOUNTER — Encounter (HOSPITAL_COMMUNITY)
Admission: RE | Disposition: A | Discharge status: Home / Self Care | Payer: Self-pay | Source: Ambulatory Visit | Attending: Obstetrics and Gynecology

## 2013-03-30 ENCOUNTER — Ambulatory Visit (HOSPITAL_COMMUNITY): Payer: 59 | Admitting: Anesthesiology

## 2013-03-30 ENCOUNTER — Ambulatory Visit (HOSPITAL_COMMUNITY)
Admission: RE | Admit: 2013-03-30 | Discharge: 2013-03-31 | Disposition: A | Discharge status: Home / Self Care | Payer: 59 | Source: Ambulatory Visit | Attending: Obstetrics and Gynecology | Admitting: Obstetrics and Gynecology

## 2013-03-30 DIAGNOSIS — D251 Intramural leiomyoma of uterus: Secondary | ICD-10-CM | POA: Insufficient documentation

## 2013-03-30 DIAGNOSIS — N950 Postmenopausal bleeding: Secondary | ICD-10-CM | POA: Insufficient documentation

## 2013-03-30 DIAGNOSIS — IMO0001 Reserved for inherently not codable concepts without codable children: Secondary | ICD-10-CM | POA: Insufficient documentation

## 2013-03-30 DIAGNOSIS — N8000 Endometriosis of the uterus, unspecified: Secondary | ICD-10-CM | POA: Insufficient documentation

## 2013-03-30 DIAGNOSIS — Z90710 Acquired absence of both cervix and uterus: Secondary | ICD-10-CM | POA: Diagnosis present

## 2013-03-30 DIAGNOSIS — K219 Gastro-esophageal reflux disease without esophagitis: Secondary | ICD-10-CM | POA: Insufficient documentation

## 2013-03-30 DIAGNOSIS — R109 Unspecified abdominal pain: Secondary | ICD-10-CM | POA: Insufficient documentation

## 2013-03-30 DIAGNOSIS — G709 Myoneural disorder, unspecified: Secondary | ICD-10-CM | POA: Insufficient documentation

## 2013-03-30 HISTORY — PX: LAPAROSCOPIC ASSISTED VAGINAL HYSTERECTOMY: SHX5398

## 2013-03-30 HISTORY — PX: BILATERAL SALPINGECTOMY: SHX5743

## 2013-03-30 LAB — TYPE AND SCREEN
ABO/RH(D): O POS
Antibody Screen: NEGATIVE

## 2013-03-30 LAB — ABO/RH: ABO/RH(D): O POS

## 2013-03-30 SURGERY — HYSTERECTOMY, VAGINAL, LAPAROSCOPY-ASSISTED
Anesthesia: General | Site: Abdomen

## 2013-03-30 MED ORDER — PROPOFOL 10 MG/ML IV EMUL
INTRAVENOUS | Status: AC
  Filled 2013-03-30: qty 20

## 2013-03-30 MED ORDER — LABETALOL HCL 5 MG/ML IV SOLN
INTRAVENOUS | Status: DC | PRN
  Administered 2013-03-30: 5 mg via INTRAVENOUS

## 2013-03-30 MED ORDER — SODIUM CHLORIDE 0.9 % IJ SOLN
INTRAMUSCULAR | Status: AC
  Filled 2013-03-30: qty 100

## 2013-03-30 MED ORDER — KETOROLAC TROMETHAMINE 30 MG/ML IJ SOLN
INTRAMUSCULAR | Status: DC | PRN
  Administered 2013-03-30: 30 mg via INTRAVENOUS

## 2013-03-30 MED ORDER — ESTRADIOL 0.1 MG/GM VA CREA
TOPICAL_CREAM | VAGINAL | Status: AC
  Filled 2013-03-30: qty 42.5

## 2013-03-30 MED ORDER — GLYCOPYRROLATE 0.2 MG/ML IJ SOLN
INTRAMUSCULAR | Status: DC | PRN
  Administered 2013-03-30: 0.4 mg via INTRAVENOUS

## 2013-03-30 MED ORDER — VASOPRESSIN 20 UNIT/ML IJ SOLN
INTRAMUSCULAR | Status: AC
  Filled 2013-03-30: qty 1

## 2013-03-30 MED ORDER — GLYCOPYRROLATE 0.2 MG/ML IJ SOLN
INTRAMUSCULAR | Status: AC
  Filled 2013-03-30: qty 3

## 2013-03-30 MED ORDER — ONDANSETRON HCL 4 MG PO TABS: 4.0000 mg | ORAL_TABLET | Freq: Four times a day (QID) | ORAL | Status: DC | PRN

## 2013-03-30 MED ORDER — DULOXETINE HCL 60 MG PO CPEP
60.0000 mg | ORAL_CAPSULE | Freq: Every day | ORAL | Status: DC
  Filled 2013-03-30 (×2): qty 1

## 2013-03-30 MED ORDER — LACTATED RINGERS IV SOLN
INTRAVENOUS | Status: DC
  Administered 2013-03-30 (×3): via INTRAVENOUS

## 2013-03-30 MED ORDER — BUPIVACAINE HCL (PF) 0.25 % IJ SOLN
INTRAMUSCULAR | Status: DC | PRN
  Administered 2013-03-30: 30 mL

## 2013-03-30 MED ORDER — ONDANSETRON HCL 4 MG/2ML IJ SOLN
INTRAMUSCULAR | Status: DC | PRN
  Administered 2013-03-30: 4 mg via INTRAVENOUS

## 2013-03-30 MED ORDER — SCOPOLAMINE 1 MG/3DAYS TD PT72
1.0000 | MEDICATED_PATCH | TRANSDERMAL | Status: DC
  Administered 2013-03-30: 1.5 mg via TRANSDERMAL

## 2013-03-30 MED ORDER — HYDROMORPHONE HCL PF 1 MG/ML IJ SOLN
0.2000 mg | INTRAMUSCULAR | Status: DC | PRN
Start: 2013-03-30 — End: 2013-03-31
  Administered 2013-03-30: 0.6 mg via INTRAVENOUS
  Filled 2013-03-30: qty 1

## 2013-03-30 MED ORDER — PROMETHAZINE HCL 25 MG/ML IJ SOLN: 6.2500 mg | INTRAMUSCULAR | Status: DC | PRN

## 2013-03-30 MED ORDER — KETOROLAC TROMETHAMINE 30 MG/ML IJ SOLN: 15.0000 mg | Freq: Once | INTRAMUSCULAR | Status: DC | PRN

## 2013-03-30 MED ORDER — HYDROMORPHONE HCL PF 1 MG/ML IJ SOLN
INTRAMUSCULAR | Status: DC | PRN
  Administered 2013-03-30: 0.5 mg via INTRAVENOUS

## 2013-03-30 MED ORDER — FENTANYL CITRATE 0.05 MG/ML IJ SOLN
INTRAMUSCULAR | Status: AC
  Filled 2013-03-30: qty 5

## 2013-03-30 MED ORDER — MIDAZOLAM HCL 2 MG/2ML IJ SOLN
INTRAMUSCULAR | Status: DC | PRN
  Administered 2013-03-30: 2 mg via INTRAVENOUS

## 2013-03-30 MED ORDER — ROCURONIUM BROMIDE 100 MG/10ML IV SOLN
INTRAVENOUS | Status: AC
  Filled 2013-03-30: qty 1

## 2013-03-30 MED ORDER — OXYCODONE-ACETAMINOPHEN 5-325 MG PO TABS
1.0000 | ORAL_TABLET | ORAL | Status: DC | PRN
  Administered 2013-03-31 (×2): 2 via ORAL
  Filled 2013-03-30 (×2): qty 2

## 2013-03-30 MED ORDER — FENTANYL CITRATE 0.05 MG/ML IJ SOLN
INTRAMUSCULAR | Status: DC | PRN
  Administered 2013-03-30: 100 ug via INTRAVENOUS
  Administered 2013-03-30 (×2): 50 ug via INTRAVENOUS
  Administered 2013-03-30: 100 ug via INTRAVENOUS
  Administered 2013-03-30: 50 ug via INTRAVENOUS

## 2013-03-30 MED ORDER — DOCUSATE SODIUM 100 MG PO CAPS
100.0000 mg | ORAL_CAPSULE | Freq: Two times a day (BID) | ORAL | Status: DC
  Administered 2013-03-30 – 2013-03-31 (×2): 100 mg via ORAL
  Filled 2013-03-30 (×2): qty 1

## 2013-03-30 MED ORDER — FENTANYL CITRATE 0.05 MG/ML IJ SOLN
INTRAMUSCULAR | Status: AC
  Filled 2013-03-30: qty 2

## 2013-03-30 MED ORDER — SIMETHICONE 80 MG PO CHEW: 80.0000 mg | CHEWABLE_TABLET | Freq: Four times a day (QID) | ORAL | Status: DC | PRN

## 2013-03-30 MED ORDER — KETOROLAC TROMETHAMINE 30 MG/ML IJ SOLN
INTRAMUSCULAR | Status: AC
  Filled 2013-03-30: qty 1

## 2013-03-30 MED ORDER — MIDAZOLAM HCL 2 MG/2ML IJ SOLN: 0.5000 mg | Freq: Once | INTRAMUSCULAR | Status: DC | PRN

## 2013-03-30 MED ORDER — LIDOCAINE HCL (CARDIAC) 20 MG/ML IV SOLN
INTRAVENOUS | Status: DC | PRN
  Administered 2013-03-30: 80 mg via INTRAVENOUS

## 2013-03-30 MED ORDER — NEOSTIGMINE METHYLSULFATE 1 MG/ML IJ SOLN
INTRAMUSCULAR | Status: DC | PRN
  Administered 2013-03-30: 3 mg via INTRAVENOUS

## 2013-03-30 MED ORDER — FENTANYL CITRATE 0.05 MG/ML IJ SOLN
25.0000 ug | INTRAMUSCULAR | Status: DC | PRN
  Administered 2013-03-30 (×3): 25 ug via INTRAVENOUS

## 2013-03-30 MED ORDER — ACETAMINOPHEN 325 MG PO TABS: 650.0000 mg | ORAL_TABLET | ORAL | Status: DC | PRN

## 2013-03-30 MED ORDER — NEOSTIGMINE METHYLSULFATE 1 MG/ML IJ SOLN
INTRAMUSCULAR | Status: AC
  Filled 2013-03-30: qty 1

## 2013-03-30 MED ORDER — DEXAMETHASONE SODIUM PHOSPHATE 10 MG/ML IJ SOLN
INTRAMUSCULAR | Status: DC | PRN
  Administered 2013-03-30: 10 mg via INTRAVENOUS

## 2013-03-30 MED ORDER — ESTRADIOL 0.1 MG/GM VA CREA
TOPICAL_CREAM | VAGINAL | Status: DC | PRN
  Administered 2013-03-30: 1 via VAGINAL

## 2013-03-30 MED ORDER — VASOPRESSIN 20 UNIT/ML IJ SOLN
INTRAVENOUS | Status: DC | PRN
  Administered 2013-03-30: 12:00:00 via INTRAMUSCULAR

## 2013-03-30 MED ORDER — ALUM & MAG HYDROXIDE-SIMETH 200-200-20 MG/5ML PO SUSP: 30.0000 mL | ORAL | Status: DC | PRN

## 2013-03-30 MED ORDER — METRONIDAZOLE 0.75 % EX CREA: 1.0000 "application " | TOPICAL_CREAM | Freq: Two times a day (BID) | CUTANEOUS | Status: DC

## 2013-03-30 MED ORDER — HYDROMORPHONE HCL PF 1 MG/ML IJ SOLN
INTRAMUSCULAR | Status: AC
  Filled 2013-03-30: qty 1

## 2013-03-30 MED ORDER — ONDANSETRON HCL 4 MG/2ML IJ SOLN
INTRAMUSCULAR | Status: AC
  Filled 2013-03-30: qty 2

## 2013-03-30 MED ORDER — MENTHOL 3 MG MT LOZG: 1.0000 | LOZENGE | OROMUCOSAL | Status: DC | PRN

## 2013-03-30 MED ORDER — CYCLOBENZAPRINE HCL ER 15 MG PO CP24: 15.0000 mg | ORAL_CAPSULE | Freq: Every day | ORAL | Status: DC | PRN

## 2013-03-30 MED ORDER — FENTANYL CITRATE 0.05 MG/ML IJ SOLN
INTRAMUSCULAR | Status: AC
  Administered 2013-03-30: 25 ug via INTRAVENOUS
  Filled 2013-03-30: qty 2

## 2013-03-30 MED ORDER — DEXAMETHASONE SODIUM PHOSPHATE 10 MG/ML IJ SOLN
INTRAMUSCULAR | Status: AC
  Filled 2013-03-30: qty 1

## 2013-03-30 MED ORDER — SCOPOLAMINE 1 MG/3DAYS TD PT72
MEDICATED_PATCH | TRANSDERMAL | Status: AC
  Administered 2013-03-30: 1.5 mg via TRANSDERMAL
  Filled 2013-03-30: qty 1

## 2013-03-30 MED ORDER — MEPERIDINE HCL 25 MG/ML IJ SOLN: 6.2500 mg | INTRAMUSCULAR | Status: DC | PRN

## 2013-03-30 MED ORDER — LIDOCAINE HCL (CARDIAC) 20 MG/ML IV SOLN
INTRAVENOUS | Status: AC
  Filled 2013-03-30: qty 5

## 2013-03-30 MED ORDER — ROCURONIUM BROMIDE 100 MG/10ML IV SOLN
INTRAVENOUS | Status: DC | PRN
  Administered 2013-03-30: 35 mg via INTRAVENOUS
  Administered 2013-03-30: 15 mg via INTRAVENOUS

## 2013-03-30 MED ORDER — PROPOFOL 10 MG/ML IV BOLUS
INTRAVENOUS | Status: DC | PRN
  Administered 2013-03-30: 180 mg via INTRAVENOUS

## 2013-03-30 MED ORDER — ONDANSETRON HCL 4 MG/2ML IJ SOLN: 4.0000 mg | Freq: Four times a day (QID) | INTRAMUSCULAR | Status: DC | PRN

## 2013-03-30 MED ORDER — MIDAZOLAM HCL 2 MG/2ML IJ SOLN
INTRAMUSCULAR | Status: AC
  Filled 2013-03-30: qty 2

## 2013-03-30 MED ORDER — BUPIVACAINE HCL (PF) 0.25 % IJ SOLN
INTRAMUSCULAR | Status: AC
  Filled 2013-03-30: qty 30

## 2013-03-30 SURGICAL SUPPLY — 50 items
ADH SKN CLS APL DERMABOND .7 (GAUZE/BANDAGES/DRESSINGS) ×2
APL SKNCLS STERI-STRIP NONHPOA (GAUZE/BANDAGES/DRESSINGS) ×2
APPLICATOR COTTON TIP 6IN STRL (MISCELLANEOUS) ×3 IMPLANT
BARRIER ADHS 3X4 INTERCEED (GAUZE/BANDAGES/DRESSINGS) IMPLANT
BENZOIN TINCTURE PRP APPL 2/3 (GAUZE/BANDAGES/DRESSINGS) ×3 IMPLANT
BRR ADH 4X3 ABS CNTRL BYND (GAUZE/BANDAGES/DRESSINGS)
CHLORAPREP W/TINT 26ML (MISCELLANEOUS) ×3 IMPLANT
CLOTH BEACON ORANGE TIMEOUT ST (SAFETY) ×3 IMPLANT
CONT PATH 16OZ SNAP LID 3702 (MISCELLANEOUS) ×3 IMPLANT
COVER TABLE BACK 60X90 (DRAPES) ×3 IMPLANT
DECANTER SPIKE VIAL GLASS SM (MISCELLANEOUS) IMPLANT
DERMABOND ADVANCED (GAUZE/BANDAGES/DRESSINGS) ×1
DERMABOND ADVANCED .7 DNX12 (GAUZE/BANDAGES/DRESSINGS) ×2 IMPLANT
ELECT LIGASURE LONG (ELECTRODE) ×1 IMPLANT
ELECT LIGASURE SHORT 9 REUSE (ELECTRODE) IMPLANT
ELECT REM PT RETURN 9FT ADLT (ELECTROSURGICAL) ×3
ELECTRODE REM PT RTRN 9FT ADLT (ELECTROSURGICAL) IMPLANT
FILTER SMOKE EVAC LAPAROSHD (FILTER) ×1 IMPLANT
FORCEPS CUTTING 33CM 5MM (CUTTING FORCEPS) IMPLANT
GLOVE BIOGEL M 6.5 STRL (GLOVE) ×12 IMPLANT
GLOVE BIOGEL PI IND STRL 7.0 (GLOVE) ×4 IMPLANT
GLOVE BIOGEL PI INDICATOR 7.0 (GLOVE) ×2
GLOVE ECLIPSE 6.0 STRL STRAW (GLOVE) ×3 IMPLANT
GOWN STRL REUS W/TWL LRG LVL3 (GOWN DISPOSABLE) ×15 IMPLANT
MANIPULATOR UTERINE 4.5 ZUMI (MISCELLANEOUS) IMPLANT
NS IRRIG 1000ML POUR BTL (IV SOLUTION) ×3 IMPLANT
PACK LAVH (CUSTOM PROCEDURE TRAY) ×3 IMPLANT
PROTECTOR NERVE ULNAR (MISCELLANEOUS) ×3 IMPLANT
SET IRRIG TUBING LAPAROSCOPIC (IRRIGATION / IRRIGATOR) ×1 IMPLANT
SHEARS HARMONIC ACE PLUS 36CM (ENDOMECHANICALS) ×1 IMPLANT
SOLUTION ELECTROLUBE (MISCELLANEOUS) IMPLANT
STRIP CLOSURE SKIN 1/4X4 (GAUZE/BANDAGES/DRESSINGS) ×3 IMPLANT
SURGIFLO W/THROMBIN 8M KIT (HEMOSTASIS) ×1 IMPLANT
SUT CHROMIC 2 0 SH (SUTURE) IMPLANT
SUT CHROMIC 2 0 UR 5 27 (SUTURE) IMPLANT
SUT MNCRL AB 4-0 PS2 18 (SUTURE) IMPLANT
SUT VIC AB 0 CT1 18XCR BRD8 (SUTURE) ×4 IMPLANT
SUT VIC AB 0 CT1 36 (SUTURE) ×3 IMPLANT
SUT VIC AB 0 CT1 8-18 (SUTURE) ×6
SUT VIC AB 2-0 CT1 (SUTURE) ×3 IMPLANT
SUT VICRYL 0 UR6 27IN ABS (SUTURE) ×3 IMPLANT
SUT VICRYL 1 TIES 12X18 (SUTURE) ×3 IMPLANT
SUT VICRYL 4-0 PS2 18IN ABS (SUTURE) ×3 IMPLANT
TOWEL OR 17X24 6PK STRL BLUE (TOWEL DISPOSABLE) ×6 IMPLANT
TRAY FOLEY CATH 14FR (SET/KITS/TRAYS/PACK) ×3 IMPLANT
TROCAR XCEL NON-BLD 11X100MML (ENDOMECHANICALS) ×1 IMPLANT
TROCAR XCEL NON-BLD 5MMX100MML (ENDOMECHANICALS) ×3 IMPLANT
TROCAR XCEL OPT SLVE 5M 100M (ENDOMECHANICALS) ×3 IMPLANT
WARMER LAPAROSCOPE (MISCELLANEOUS) ×1 IMPLANT
WATER STERILE IRR 1000ML POUR (IV SOLUTION) ×3 IMPLANT

## 2013-03-30 NOTE — Discharge Instructions (Signed)
Laparoscopically Assisted Vaginal Hysterectomy  A laparoscopically assisted vaginal hysterectomy (LAVH) is a surgical procedure to remove the uterus and cervix, and sometimes the ovaries and fallopian tubes. During an LAVH, some of the surgical removal is done through the vagina, and the rest is done through a few small surgical cuts (incisions) in the abdomen.  This procedure is usually considered in women when a vaginal hysterectomy is not an option. Your health care provider will discuss the risks and benefits of the different surgical techniques at your appointment. Generally, recovery time is faster and there are fewer complications after laparoscopic procedures than after open incisional procedures. LET YOUR HEALTH CARE PROVIDER KNOW ABOUT:   Any allergies you have.  All medicines you are taking, including vitamins, herbs, eye drops, creams, and over-the-counter medicines.  Previous problems you or members of your family have had with the use of anesthetics.  Any blood disorders you have.  Previous surgeries you have had.  Medical conditions you have. RISKS AND COMPLICATIONS Generally, this is a safe procedure. However, as with any procedure, complications can occur. Possible complications include:  Allergies to medicines.  Difficulty breathing.  Bleeding.  Infection.  Damage to other structures near your uterus and cervix. BEFORE THE PROCEDURE  Ask your health care provider about changing or stopping your regular medicines.  Take certain medicines, such as a colon-emptying preparation, as directed.  Do not eat or drink anything for at least 8 hours before your surgery.  Stop smoking if you smoke. Stopping will improve your health after surgery.  Arrange for a ride home after surgery and for help at home during recovery. PROCEDURE   An IV tube will be put into one of your veins in order to give you fluids and medicines.  You will receive medicines to relax you and  medicines that make you sleep (general anesthetic).  You may have a flexible tube (catheter) put into your bladder to drain urine.  You may have a tube put through your nose or mouth that goes into your stomach (nasogastric tube). The nasogastric tube removes digestive fluids and prevents you from feeling nauseated and from vomiting.  Tight-fitting (compression) stockings will be placed on your legs to promote circulation.  Three to four small incisions will be made in your abdomen. An incision also will be made in your vagina. Probes and tools will be inserted into the small incisions. The uterus and cervix are removed (and possibly your ovaries and fallopian tubes) through your vagina as well as through the small incisions that were made in the abdomen.  Your vagina is then sewn back to normal. AFTER THE PROCEDURE  You may have a liquid diet temporarily. You will most likely return to, and tolerate, your usual diet the day after surgery.  You will be passing urine through a catheter. It will be removed the day after surgery.  Your temperature, breathing rate, heart rate, blood pressure, and oxygen level will be monitored regularly.  You will still wear compression stockings on your legs until you are able to move around.  You will use a special device or do breathing exercises to keep your lungs clear.  You will be encouraged to walk as soon as possible. Document Released: 12/26/2010 Document Revised: 09/08/2012 Document Reviewed: 07/22/2012 ExitCare Patient Information 2014 [employer].  

## 2013-03-30 NOTE — Transfer of Care (Signed)
Immediate Anesthesia Transfer of Care Note  Patient: Mary [name]  Procedure(s) Performed: Procedure(s): LAPAROSCOPIC ASSISTED VAGINAL HYSTERECTOMY (N/A) BILATERAL SALPINGECTOMY (Bilateral)  Patient Location: PACU  Anesthesia Type:General  Level of Consciousness: awake, sedated and patient cooperative  Airway & Oxygen Therapy: Patient Spontanous Breathing and Patient connected to nasal cannula oxygen  Post-op Assessment: Report given to PACU RN and Post -op Vital signs reviewed and stable  Post vital signs: Reviewed and stable  Complications: No apparent anesthesia complications

## 2013-03-30 NOTE — Interval H&P Note (Signed)
History and Physical Interval Note:  03/30/2013 11:23 AM  Mary [name]  has presented today for surgery, with the diagnosis of PMB  The various methods of treatment have been discussed with the patient and family. After consideration of risks, benefits and other options for treatment, the patient has consented to  Procedure(s): LAPAROSCOPIC ASSISTED VAGINAL HYSTERECTOMY (N/A) BILATERAL SALPINGECTOMY (Bilateral) as a surgical intervention .  The patient's history has been reviewed, patient examined, no change in status, stable for surgery.  I have reviewed the patient's chart and labs.  Questions were answered to the patient's satisfaction.    Left groin/pelvic pain started again.  Simona Huh, Genette Huertas

## 2013-03-30 NOTE — H&P (Signed)
History of Present Illness  General:  58 y/o G2P2 presents for preop for LAVH/BSO. H/o postmenopausal bleeding. S/p D&C 01/2012. Path normal but benign proliferative changes. Continues to spot. Bilateral groin pain. PMB Just taking Cymbalta. Not taking Aspirin.   Current Medications  Taking   MetroCream 2.45 % Cream 1 application to affected area Twice a day   Cymbalta 60 MG Capsule Delayed Release Particles TAKE 1 CAPSULE BY MOUTH ONCE DAILY   Not-Taking/PRN   Amrix 15 MG Capsule Extended Release 24 Hour 1 capsule Once a day   Multivitamins Tablet as directed occ   Aspirin 81 MG Tablet Chewable 1 tablet occ   Medication List reviewed and reconciled with the patient    Past Medical History  fibromyalgia, has seen Dr. Patrecia Pour  Celiac disease  Lymphocyctic colitis  spine fusionn in neck s/p fx, C3-C4  Knee replacement x 2  Cyst removed from rectum  Gestatitional diabetes  Allergies, shell fish  History of cervical dysplasia, s/o cryo 03  Degenerative joint disease  Depression  Hypercholesterolemia  Migraines  History of anemia, iron malabsorption  Osteopenia  Hemorrhoids  Vitamin D deficiency  Cold sores  Food allergies  3x4 mm left lug nodule on CT 2/14, if high risk, 1 yr f/u CT, if low risk, no f/u needed, stable 1/15. No further followup needed   Surgical History  spinal fusion, C3/4 1974  dermoid cyst removal from rectum 1979  knee replacement,left and right,Dr. Maureen Ralphs [phone]  right and left meniscectomies, Dr. Lorenz Coaster 2006  hysteroscopy, cervical bx,D&C Dr Suzzette Righter 1/14   Family History  Father: deceased 49 yrs, leukemia  Mother: alive, colon polyps  Paternal [place] Father: deceased  Paternal [place] Mother: deceased  Maternal Grand Father: deceased  Maternal Grand Mother: deceased, breast cancer, diagnosed with Breast Ca  Brother 1: alive, adopted  Paternal uncle: colon CA  Maternal uncle: colon cancer, 2 uncles  colon cancer in both sides of the family.    Social History  General:  Tobacco use  cigarettes: Never smoked Tobacco history last updated 03/23/2013 no Smoking.  Alcohol: yes, Rare, 1- per week.  no Recreational drug use.  Exercise: yes, 2-5 times per week.  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  Marital Status: single, Divorced, starting in a relationship.  Children: 1, Boys, Museum/gallery curator- (22) [place], 1, girls Amy- (24) [facility]).  Religion: Hospital doctor.  Seat belt use: yes.    Gyn History  Periods : postmenopausal.  LMP spotting off and on for about 2 weeks.  Denies H/O Birth control.  Last pap smear date 01/01/12, WNL.  Last mammogram date 05/08/11 DX mammo.  Abnormal pap smear assessed with colposcopy, treated with cryo.    OB History  Number of pregnancies 2.  Pregnancy # 1 live birth, vaginal delivery, boy.  Pregnancy # 2 live birth, vaginal delivery, girl.    Allergies  Celebrex: swelling  shellfish   Hospitalization/Major Diagnostic Procedure  childbirth 86, 88   Review of Systems  Denies fever/chills, chest pain, SOB, headaches, numbness/tingling. No h/o complication with anesthesia, bleeding disorders or blood clots.   Vital Signs  Wt 232, Ht 62.25, BMI 42.09, Pulse sitting 92, BP sitting 129/77.   Physical Examination  GENERAL:  Patient appears alert and oriented.  General Appearance: well-appearing, well-developed, no acute distress.  Speech: clear.  LUNGS:  Auscultation: no wheezing/rhonchi/rales. CTA bilaterally.  HEART:  Heart sounds: normal. RRR. no murmur.  ABDOMEN:  General: soft nontender, nondistended, no masses.  FEMALE GENITOURINARY:  Pelvic  Not examined.  EXTREMITIES:  General: No edema or calf tenderness.     Assessments   1. Pre-op exam - V72.84 (Primary)   Treatment  1. Pre-op exam  Notes: R/B/A of procedure discussed with pt at length. All questions answered. Consent obtained.    Follow Up  2 Weeks post op

## 2013-03-30 NOTE — H&P (Signed)
Addendum: Pt has had groin pain, bilateral.  CT scan and ultrasounds normal. Pelvic exam at time of preop WNL.  Moderate prolapse, uterus 8 weeks, mobile.

## 2013-03-30 NOTE — Brief Op Note (Signed)
03/30/2013  3:29 PM  PATIENT:  Mary [name]  58 y.o. female  PRE-OPERATIVE DIAGNOSIS:  PMB. Bilateral groin pain  POST-OPERATIVE DIAGNOSIS:  same  PROCEDURE:  Procedure(s): LAPAROSCOPIC ASSISTED VAGINAL HYSTERECTOMY (N/A) BILATERAL SALPINGECTOMY (Bilateral)  SURGEON:  Surgeon(s) and Role:    * Thurnell Lose, MD - Primary    * Annalee Genta, DO - Assisting  PHYSICIAN ASSISTANT:   ASSISTANTS: RN, Technician   ANESTHESIA:   general  EBL:  Total I/O In: 2400 [I.V.:2400] Out: 800 [Urine:500; Blood:300]  BLOOD ADMINISTERED:none  DRAINS: Urinary Catheter (Foley)   LOCAL MEDICATIONS USED:  OTHER 0.25 % Marcaine, Vasopressin 20/100 ns  SPECIMEN:  Source of Specimen:  Uterus/tubes/ovaries, phlebolith  DISPOSITION OF SPECIMEN:  PATHOLOGY  COUNTS:  YES  TOURNIQUET:  * No tourniquets in log *  DICTATION: .Dictation number [id number]  PLAN OF CARE: Admit for overnight observation  PATIENT DISPOSITION:  PACU - hemodynamically stable.   Delay start of Pharmacological VTE agent (>24hrs) due to surgical blood loss or risk of bleeding: yes

## 2013-03-30 NOTE — Anesthesia Preprocedure Evaluation (Addendum)
Anesthesia Evaluation  Patient identified by MRN, date of birth, ID band Patient awake    Reviewed: Allergy & Precautions, H&P , Patient's Chart, lab work & pertinent test results, reviewed documented beta blocker date and time   History of Anesthesia Complications Negative for: history of anesthetic complications  Airway Mallampati: II TM Distance: >3 FB Neck ROM: full    Dental   Pulmonary  breath sounds clear to auscultation        Cardiovascular Exercise Tolerance: Good Rhythm:regular Rate:Normal     Neuro/Psych  Neuromuscular disease    GI/Hepatic GERD-  ,  Endo/Other  Morbid obesity  Renal/GU      Musculoskeletal  (+) Fibromyalgia -  Abdominal   Peds  Hematology   Anesthesia Other Findings   Reproductive/Obstetrics                           Anesthesia Physical Anesthesia Plan  ASA: III  Anesthesia Plan: General ETT   Post-op Pain Management:    Induction:   Airway Management Planned: Video Laryngoscope Planned  Additional Equipment:   Intra-op Plan:   Post-operative Plan:   Informed Consent: I have reviewed the patients History and Physical, chart, labs and discussed the procedure including the risks, benefits and alternatives for the proposed anesthesia with the patient or authorized representative who has indicated his/her understanding and acceptance.   Dental Advisory Given  Plan Discussed with: CRNA and Surgeon  Anesthesia Plan Comments:       s/p cervical fusion Anesthesia Quick Evaluation

## 2013-03-30 NOTE — Anesthesia Postprocedure Evaluation (Signed)
  Anesthesia Post-op Note  Anesthesia Post Note  Patient: Mary [name]  Procedure(s) Performed: Procedure(s) (LRB): LAPAROSCOPIC ASSISTED VAGINAL HYSTERECTOMY (N/A) BILATERAL SALPINGECTOMY (Bilateral)  Anesthesia type: General  Patient location: PACU  Post pain: Pain level controlled  Post assessment: Post-op Vital signs reviewed  Last Vitals:  Filed Vitals:   03/30/13 1600  BP: 139/77  Pulse: 88  Temp:   Resp: 20    Post vital signs: Reviewed  Level of consciousness: sedated  Complications: No apparent anesthesia complications

## 2013-03-31 ENCOUNTER — Encounter (HOSPITAL_COMMUNITY): Payer: Self-pay | Admitting: Obstetrics and Gynecology

## 2013-03-31 LAB — CBC
HEMATOCRIT: 35.3 % — AB (ref 36.0–46.0)
HEMOGLOBIN: 11.5 g/dL — AB (ref 12.0–15.0)
MCH: 27.1 pg (ref 26.0–34.0)
MCHC: 32.6 g/dL (ref 30.0–36.0)
MCV: 83.3 fL (ref 78.0–100.0)
Platelets: 275 10*3/uL (ref 150–400)
RBC: 4.24 MIL/uL (ref 3.87–5.11)
RDW: 14.8 % (ref 11.5–15.5)
WBC: 12.9 10*3/uL — ABNORMAL HIGH (ref 4.0–10.5)

## 2013-03-31 MED ORDER — OXYCODONE-ACETAMINOPHEN 5-325 MG PO TABS: 1.0000 | ORAL_TABLET | ORAL | Status: DC | PRN

## 2013-03-31 MED ORDER — IBUPROFEN 600 MG PO TABS: 600.0000 mg | ORAL_TABLET | Freq: Four times a day (QID) | ORAL | Status: DC | PRN

## 2013-03-31 NOTE — Addendum Note (Signed)
Addendum created 03/31/13 0820 by Jonna Munro, CRNA   Modules edited: Notes Section   Notes Section:  File: [id number]

## 2013-03-31 NOTE — Anesthesia Postprocedure Evaluation (Signed)
  Anesthesia Post-op Note  Patient: Mary [name]  Procedure(s) Performed: Procedure(s): LAPAROSCOPIC ASSISTED VAGINAL HYSTERECTOMY (N/A) BILATERAL SALPINGECTOMY (Bilateral)  Patient Location: [facility]  Anesthesia Type:General  Level of Consciousness: awake, alert  and oriented  Airway and Oxygen Therapy: Patient Spontanous Breathing and Patient connected to nasal cannula oxygen  Post-op Pain: none  Post-op Assessment: Post-op Vital signs reviewed, Patient's Cardiovascular Status Stable, Respiratory Function Stable and Pain level controlled  Post-op Vital Signs: Reviewed and stable  Complications: No apparent anesthesia complications

## 2013-03-31 NOTE — Op Note (Signed)
Mary [name], Mary [name] NO.:  [id number]  MEDICAL RECORD NO.:  [id number]  LOCATION:  [id number]                          FACILITY:  [facility]  PHYSICIAN:  Jola Schmidt, MD   DATE OF BIRTH:  November 17, 1955  DATE OF PROCEDURE:  03/30/2013 DATE OF DISCHARGE:                              OPERATIVE REPORT   PREOPERATIVE DIAGNOSIS:  Postmenopausal bleeding and bilateral groin pain.  POSTOPERATIVE DIAGNOSIS:  Postmenopausal bleeding and bilateral groin pain.  PROCEDURE:  Laparoscopic-assisted vaginal hysterectomy and bilateral salpingo-oophorectomy.  SURGEON:  Jola Schmidt, MD  ASSISTANT:  Dr. Erenest Blank, RN, and technician.  ANESTHESIA:  General.  IV FLUIDS:  2400 mL.  URINE OUTPUT:  500 mL of clear urine.  ESTIMATED BLOOD LOSS:  300.  BLOOD ADMINISTERED:  None.  DRAINS:  Foley catheter.  Local 0.25% Marcaine and vasopressin 20:100 of normal saline.  SPECIMEN:  Uterus with tubes and ovaries attached at phlebolith. Disposition of specimen to Pathology.  COUNTS:  Correct.  DISPOSITION:  To PACU hemodynamically stable.  COMPLICATIONS:  None.  INDICATIONS:  The patient had a normal appearing uterus, atrophic ovaries, and fallopian tubes were normal.  Cul-de-sac was clean.  Small adhesion of the small bowel to the left lower quadrant.  The vaginal mucosa was atrophic and the cervix was high in the vagina.  PROCEDURE IN DETAIL:  Ms. Gammel was taken to the operating room, where she underwent general endotracheal anesthesia without complication.  She was placed in the dorsal lithotomy position prior to the intubation. She was then prepped and draped in normal sterile fashion.  A time-out was taken prior to prepping the patient.  A weighted speculum was placed in the vagina after the prep was completed.  The anterior lip of the cervix was grasped with a single- tooth tenaculum and the Hulka uterine manipulator was advanced through the endocervix.  I  confirmed that the uterus was anteverted prior to inserting the Hulka.  New gloves were replaced.  Attention was then turned to the abdomen. The patient had a large pannus, so we adjusted the draped so that we could see the abdomen adequately.  Marcaine was injected at the umbilicus and a small incision was made with a scalpel.  A 5-mm trocar was placed over the laparoscope and the abdomen was entered under direct visualization.  Low-flow gas was placed on.  There was minimal distention with that.  High flow was performed and after that adequate intraabdominal access was noted.  Two more ports were placed under direct visualization, premedicating with 0.25% Marcaine as well.  Once both ports right-hand blunt probe manipulator was used to identify the anatomy above.  The Harmonic Scalpel was then used to release that adhesion to the left lower quadrant.  We started on the left side first, the round ligament was transected with the Harmonic Scalpel with the usual fashion.  The fallopian tube was then grasped by the assistant and the Harmonic Scalpel was used to ligate the infundibulopelvic ligament.  The infundibulopelvic ligament was then transected.  We looked at both locations of ureters prior to transecting the infundibulopelvic ligament, the ureters were well out of the location of the  structure. The mesosalpinx and broad ligament were then transected to release the fallopian tubes.  I did came down on the left side close to the uterine artery and there was some bleeding noted.  With Harmonic, I thought I had hemostasis as we had turned our attention to the right side, but it did begin to bleed again.  I attempted to make a bladder flap also prior to going to the right side.  The same was done on the opposite side.  No bleeding was noted for the uterine artery.  I then turned attention and I was able to get hemostasis by using the Harmonic to cauterize the broad ligament where the  uterine artery was.  Ureters again were out of the location of this structure.  Copious irrigation was used to perform this so that we could visualized where the bleeding was coming from.  The trocars remained in place in the abdomen.  The camera was removed and all instruments were removed.  Then, we turned our attention below, we made sure that the knees were staying flexed.  On each side, both of the knees were straightening, but we made sure that they were constantly flex.  The legs were brought up towards the patient's hip.  Weighted speculum was placed in the vagina and a curved Deaver was used to visualize the cervix.  The patient had some vulvar hair that needed to be retracted, so I will placed a Rigby retractor.  The cervix was then visualized and grasped with a single-tooth tenaculum x2.  The cervical mucosa was then injected with vasopressin circumferentially.  The Bovie was then used to incise the paracervical region.  I then opened the anterior cul-de-sac easily.  Blood was noted consistent with the irrigation and probably some frank blood.  The posterior cul-de-sac was also then entered easily.  Then, a long weighted speculum was then placed.  Uterosacrals were grasped with curved Heaney x2, on both sides, and suture ligated with 0 Vicryl.  They were both held on hemostats out laterally from the vagina.  The long LigaSure was then used to transect the cardinal ligaments and the broad ligaments.  With the first bite of the LigaSure, the bleeding slowed significantly.  The uterus was then easily released.  There was some bleeding on the right-hand side, and that was made hemostatic with the Bovie.  Then I placed 2 angle stitches using the uterosacral ligament, both of those were 0 Vicryl.  I then closed the vaginal cuff.  The posterior cuff was bleeding, it was herniated through quite a bit, but the anterior cuff was tagged up.  I, then closed the cuff in a running locked  fashion, and hemostasis was achieved.  I then put a 4 g of Estrace into the vagina and moist packing was placed inside the vagina.  Attention was then turned to the abdomen.  CO2 gas was used to inflate the abdomen.  Again, we looked at the cuff and it was dry.  We irrigated and then noticed a little bit of bleeding on the left lower side.  We attempted to use a Harmonic, but there was still just light oozing, so we put in Surgiflo for hemostasis and that seemed to stop the bleeding. The abdomen was copiously irrigated.  Prior to closure of the vaginal cuff, I will explore the intestines while we were waiting for the Surgiflo, I did another evaluation of the intestines and everything appeared normal.  All instruments were removed under direct  visualization.  CO2 gas was then expelled with expiratory pressure from the CRNA.  Ingest with compression of the abdomen until gas was removed.  The trocar sites were then reapproximated with 4-0 Monocryl in a subcuticular fashion with the knot buried.  Dermabond was then applied to the incisions.  The patient was awakened and taken to the recovery room in stable condition.  She was in stable condition.  She had SCDs on running prior to the procedure.  She had antibiotics prior to surgery.  She tolerated the procedure well.     Jola Schmidt, MD     EBV/MEDQ  D:  03/30/2013  T:  03/31/2013  Job:  [id number]

## 2013-07-08 ENCOUNTER — Other Ambulatory Visit: Payer: Self-pay

## 2013-07-08 DIAGNOSIS — Z1231 Encounter for screening mammogram for malignant neoplasm of breast: Secondary | ICD-10-CM

## 2013-08-04 ENCOUNTER — Ambulatory Visit
Admission: RE | Admit: 2013-08-04 | Discharge: 2013-08-04 | Disposition: A | Discharge status: Home / Self Care | Payer: 59 | Source: Ambulatory Visit

## 2013-08-04 DIAGNOSIS — Z1231 Encounter for screening mammogram for malignant neoplasm of breast: Secondary | ICD-10-CM

## 2013-11-04 ENCOUNTER — Other Ambulatory Visit: Payer: Self-pay

## 2014-03-02 ENCOUNTER — Inpatient Hospital Stay (HOSPITAL_COMMUNITY)
Admission: EM | Admit: 2014-03-02 | Discharge: 2014-03-03 | DRG: 916 | Disposition: A | Discharge status: Home / Self Care | Payer: 59 | Attending: Pulmonary Disease | Admitting: Pulmonary Disease

## 2014-03-02 ENCOUNTER — Inpatient Hospital Stay (HOSPITAL_COMMUNITY): Payer: 59

## 2014-03-02 ENCOUNTER — Encounter (HOSPITAL_COMMUNITY): Payer: Self-pay | Admitting: Emergency Medicine

## 2014-03-02 DIAGNOSIS — X58XXXA Exposure to other specified factors, initial encounter: Secondary | ICD-10-CM | POA: Diagnosis present

## 2014-03-02 DIAGNOSIS — T782XXA Anaphylactic shock, unspecified, initial encounter: Secondary | ICD-10-CM

## 2014-03-02 DIAGNOSIS — F329 Major depressive disorder, single episode, unspecified: Secondary | ICD-10-CM | POA: Diagnosis present

## 2014-03-02 DIAGNOSIS — T7809XA Anaphylactic reaction due to other food products, initial encounter: Secondary | ICD-10-CM | POA: Diagnosis present

## 2014-03-02 DIAGNOSIS — K219 Gastro-esophageal reflux disease without esophagitis: Secondary | ICD-10-CM | POA: Diagnosis present

## 2014-03-02 DIAGNOSIS — K900 Celiac disease: Secondary | ICD-10-CM | POA: Diagnosis present

## 2014-03-02 DIAGNOSIS — E669 Obesity, unspecified: Secondary | ICD-10-CM | POA: Diagnosis present

## 2014-03-02 DIAGNOSIS — T781XXA Other adverse food reactions, not elsewhere classified, initial encounter: Secondary | ICD-10-CM | POA: Diagnosis present

## 2014-03-02 DIAGNOSIS — R0602 Shortness of breath: Secondary | ICD-10-CM | POA: Diagnosis present

## 2014-03-02 DIAGNOSIS — M797 Fibromyalgia: Secondary | ICD-10-CM | POA: Diagnosis present

## 2014-03-02 DIAGNOSIS — Z79899 Other long term (current) drug therapy: Secondary | ICD-10-CM | POA: Diagnosis not present

## 2014-03-02 DIAGNOSIS — Z6841 Body Mass Index (BMI) 40.0 and over, adult: Secondary | ICD-10-CM | POA: Diagnosis not present

## 2014-03-02 DIAGNOSIS — M1990 Unspecified osteoarthritis, unspecified site: Secondary | ICD-10-CM | POA: Diagnosis present

## 2014-03-02 HISTORY — DX: Anaphylactic shock, unspecified, initial encounter: T78.2XXA

## 2014-03-02 LAB — COMPREHENSIVE METABOLIC PANEL
ALBUMIN: 3.8 g/dL (ref 3.5–5.2)
ALK PHOS: 54 U/L (ref 39–117)
ALT: 35 U/L (ref 0–35)
AST: 27 U/L (ref 0–37)
Anion gap: 11 (ref 5–15)
BILIRUBIN TOTAL: 0.8 mg/dL (ref 0.3–1.2)
BUN: 12 mg/dL (ref 6–23)
CO2: 25 mmol/L (ref 19–32)
Calcium: 9 mg/dL (ref 8.4–10.5)
Chloride: 102 mmol/L (ref 96–112)
Creatinine, Ser: 0.76 mg/dL (ref 0.50–1.10)
GFR, EST NON AFRICAN AMERICAN: 90 mL/min — AB (ref >90)
Glucose, Bld: 138 mg/dL — ABNORMAL HIGH (ref 70–99)
POTASSIUM: 3.7 mmol/L (ref 3.5–5.1)
Sodium: 138 mmol/L (ref 135–145)
Total Protein: 7.5 g/dL (ref 6.0–8.3)

## 2014-03-02 LAB — CBC WITH DIFFERENTIAL/PLATELET
BASOS ABS: 0 10*3/uL (ref 0.0–0.1)
Basophils Relative: 1 % (ref 0–1)
Eosinophils Absolute: 0.2 10*3/uL (ref 0.0–0.7)
Eosinophils Relative: 2 % (ref 0–5)
HCT: 41.7 % (ref 36.0–46.0)
Hemoglobin: 13.2 g/dL (ref 12.0–15.0)
Lymphocytes Relative: 43 % (ref 12–46)
Lymphs Abs: 3.4 10*3/uL (ref 0.7–4.0)
MCH: 24.7 pg — AB (ref 26.0–34.0)
MCHC: 31.7 g/dL (ref 30.0–36.0)
MCV: 78.1 fL (ref 78.0–100.0)
Monocytes Absolute: 0.6 10*3/uL (ref 0.1–1.0)
Monocytes Relative: 7 % (ref 3–12)
Neutro Abs: 3.6 10*3/uL (ref 1.7–7.7)
Neutrophils Relative %: 47 % (ref 43–77)
PLATELETS: 348 10*3/uL (ref 150–400)
RBC: 5.34 MIL/uL — ABNORMAL HIGH (ref 3.87–5.11)
RDW: 18.4 % — AB (ref 11.5–15.5)
WBC: 7.8 10*3/uL (ref 4.0–10.5)

## 2014-03-02 MED ORDER — EPINEPHRINE 0.3 MG/0.3ML IJ SOAJ
0.3000 mg | Freq: Once | INTRAMUSCULAR | Status: AC
  Administered 2014-03-02: 0.3 mg via INTRAMUSCULAR

## 2014-03-02 MED ORDER — METHYLPREDNISOLONE SODIUM SUCC 125 MG IJ SOLR
60.0000 mg | Freq: Four times a day (QID) | INTRAMUSCULAR | Status: DC
  Administered 2014-03-03 (×2): 60 mg via INTRAVENOUS
  Filled 2014-03-02 (×2): qty 2

## 2014-03-02 MED ORDER — DIPHENHYDRAMINE HCL 50 MG/ML IJ SOLN
50.0000 mg | Freq: Four times a day (QID) | INTRAMUSCULAR | Status: DC
  Administered 2014-03-03 (×2): 50 mg via INTRAVENOUS
  Filled 2014-03-02 (×2): qty 1

## 2014-03-02 MED ORDER — SODIUM CHLORIDE 0.9 % IV BOLUS (SEPSIS)
1000.0000 mL | Freq: Once | INTRAVENOUS | Status: AC
  Administered 2014-03-02: 1000 mL via INTRAVENOUS

## 2014-03-02 MED ORDER — SODIUM CHLORIDE 0.9 % IV SOLN
INTRAVENOUS | Status: DC
  Administered 2014-03-02: 22:00:00 via INTRAVENOUS

## 2014-03-02 MED ORDER — DIPHENHYDRAMINE HCL 50 MG/ML IJ SOLN: 25.0000 mg | Freq: Once | INTRAMUSCULAR | Status: DC

## 2014-03-02 MED ORDER — EPINEPHRINE HCL 1 MG/ML IJ SOLN
2.0000 ug/min | INTRAVENOUS | Status: DC
  Filled 2014-03-02: qty 4

## 2014-03-02 MED ORDER — EPINEPHRINE HCL 1 MG/ML IJ SOLN
INTRAMUSCULAR | Status: AC
  Filled 2014-03-02: qty 1

## 2014-03-02 MED ORDER — FAMOTIDINE IN NACL 20-0.9 MG/50ML-% IV SOLN
20.0000 mg | Freq: Once | INTRAVENOUS | Status: AC
  Administered 2014-03-02: 20 mg via INTRAVENOUS
  Filled 2014-03-02: qty 50

## 2014-03-02 MED ORDER — EPINEPHRINE 0.3 MG/0.3ML IJ SOAJ
INTRAMUSCULAR | Status: AC
  Administered 2014-03-02: 0.3 mg via INTRAMUSCULAR
  Filled 2014-03-02: qty 0.3

## 2014-03-02 MED ORDER — CETYLPYRIDINIUM CHLORIDE 0.05 % MT LIQD
7.0000 mL | Freq: Two times a day (BID) | OROMUCOSAL | Status: DC
  Administered 2014-03-03: 7 mL via OROMUCOSAL

## 2014-03-02 MED ORDER — DIPHENHYDRAMINE HCL 50 MG/ML IJ SOLN
25.0000 mg | Freq: Once | INTRAMUSCULAR | Status: AC
  Administered 2014-03-02: 25 mg via INTRAVENOUS
  Filled 2014-03-02: qty 1

## 2014-03-02 MED ORDER — FAMOTIDINE IN NACL 20-0.9 MG/50ML-% IV SOLN
20.0000 mg | Freq: Two times a day (BID) | INTRAVENOUS | Status: DC
  Administered 2014-03-03: 20 mg via INTRAVENOUS
  Filled 2014-03-02: qty 50

## 2014-03-02 MED ORDER — METHYLPREDNISOLONE SODIUM SUCC 125 MG IJ SOLR
125.0000 mg | Freq: Once | INTRAMUSCULAR | Status: AC
  Administered 2014-03-02: 125 mg via INTRAVENOUS
  Filled 2014-03-02: qty 2

## 2014-03-02 NOTE — ED Notes (Signed)
Pt states she is having an allergic reaction to protein shake. Pt reports she taking benadryl and feels like her throat is closing up. Pt states her hands and feet are itching. Pt has hives and redness to face with labored breathing.

## 2014-03-02 NOTE — H&P (Signed)
Name: Mary [name] MRN: [id number] DOB: 06-Oct-1955    ADMISSION DATE:  03/02/2014 CONSULTATION DATE:  03/02/2014  REFERRING MD :  EDP  CHIEF COMPLAINT:  Anaphylaxis  BRIEF PATIENT DESCRIPTION: 59 year old female presented to ED 2/11 c/o allergic reaction after drinking new protein shake. Felt throat closing, IM epi given in ED x 2 with improvement. PCCM to admit.   SIGNIFICANT EVENTS   STUDIES:   HISTORY OF PRESENT ILLNESS:  59 year old female with PMH signigicant for Celiac disease, fibromyalgia, depression, presented to [facility] ED 2/11 with allergic reaction. She has had these in past (not as severe as this presentation) and has linked them to chicory root. 2/11 she had a protein shake and immediately noted throat and tongue swelling, hives. She presented to ED for this complaint. In ED she was given typical allergic reaction cocktail including 2 doses of IM epinephrine. She has had some resolution of symptoms with this, however her voice remains abnormal and tongue reamins edematous. There is question of whether or not she should be intubated for airway protection. PCCM to evaluate.   PAST MEDICAL HISTORY :   has a past medical history of PONV (postoperative nausea and vomiting); Fibromyalgia; Celiac disease; Depression; SVD (spontaneous vaginal delivery); GERD (gastroesophageal reflux disease); and Arthritis.  has past surgical history that includes Spinal fusion; dermoid cystect; Replacement total knee; Hysteroscopy w/D&C (01/28/2012); Wisdom tooth extraction; Laparoscopic assisted vaginal hysterectomy (N/A, 03/30/2013); and Bilateral salpingectomy (Bilateral, 03/30/2013). Prior to Admission medications   Medication Sig Start Date End Date Taking? Authorizing Provider  DULoxetine (CYMBALTA) 60 MG capsule Take 60 mg by mouth daily.   Yes Historical Provider, MD  Multiple Vitamin (MULTIVITAMIN WITH MINERALS) TABS tablet Take 1 tablet by mouth daily.   Yes Historical Provider, MD  Omega-3 Fatty  Acids (FISH OIL PO) Take 1 tablet by mouth daily.   Yes Historical Provider, MD  Pseudoephedrine-APAP-DM (DAYQUIL PO) Take 1 tablet by mouth every 4 (four) hours.   Yes Historical Provider, MD  ibuprofen (ADVIL,MOTRIN) 600 MG tablet Take 1 tablet (600 mg total) by mouth every 6 (six) hours as needed for mild pain, moderate pain or cramping. Patient not taking: Reported on 03/02/2014 03/31/13   Thurnell Lose, MD  oxyCODONE-acetaminophen (PERCOCET/ROXICET) 5-325 MG per tablet Take 1-2 tablets by mouth every 4 (four) hours as needed for severe pain (moderate to severe pain (when tolerating fluids)). Patient not taking: Reported on 03/02/2014 03/31/13   Thurnell Lose, MD   Allergies  Allergen Reactions  . 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 Scratch throat. cough  . Celebrex [Celecoxib] Swelling    Facial swelling  . Shellfish Allergy Swelling    FAMILY HISTORY:  family history is not on file. SOCIAL HISTORY:  reports that she has never smoked. She has never used smokeless tobacco. She reports that she drinks alcohol. She reports that she does not use illicit drugs.  REVIEW OF SYSTEMS:    Bolds are positive  Constitutional: weight loss, gain, night sweats, Fevers, chills, fatigue .  HEENT: headaches, Sore throat - swelling, sneezing, nasal congestion, post nasal drip, Difficulty swallowing, Tooth/dental problems, visual complaints visual changes, ear ache CV:  chest pain, radiates: ,Orthopnea, PND, swelling in lower extremities, dizziness, palpitations, syncope.  GI  heartburn, indigestion, abdominal pain, nausea, vomiting, diarrhea, change in bowel habits, loss of appetite, bloody stools.  Resp: cough, productive: , hemoptysis, dyspnea, chest pain, pleuritic.  Skin:  rash(hives) or itching (hands and feet) or icterus GU: dysuria, change in color of urine, urgency or frequency. flank pain, hematuria  MS: joint pain or  swelling. decreased range of motion  Psych: change in mood or affect. depression or anxiety.  Neuro: difficulty with speech, weakness, numbness, ataxia   SUBJECTIVE:   VITAL SIGNS: Temp:  [98.5 F (36.9 C)] 98.5 F (36.9 C) (02/11 2011) Pulse Rate:  [93-114] 93 (02/11 2105) Resp:  [18-22] 18 (02/11 2105) BP: (137-156)/(75-92) 150/86 mmHg (02/11 2105) SpO2:  [92 %-100 %] 100 % (02/11 2105)  PHYSICAL EXAMINATION: General:  Obese female in NAD, sitting in bed Neuro:  Alert, oriented, non-focal HEENT:  [place]/AT, PERRL, no JVD noted. Oropharyngeal erythema and mild tongue edema. Cardiovascular:  RRR, no MRG Lungs:  Clear bilateral breath sounds Abdomen:  Soft, non-tender, non-distended Musculoskeletal:  No acute deformity or ROM limitation Skin:  Grossly intact, uticaria (resovling)  No results for input(s): NA, K, CL, CO2, BUN, CREATININE, GLUCOSE in the last 168 hours. No results for input(s): HGB, HCT, WBC, PLT in the last 168 hours. No results found.  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  She was seen by Dr. Mosetta Anis about 5 yrs ago for allergies.  She was given an epi pen, but has not renewed this for some time.  She drank a protein shake earlier tonight.  Immediately after this she developed itching, redness, and feeling like her mouth, lips, and body were swelling.  She took a benadryl, but this didn't  help.  She came to the ER.  She was tx for anaphylaxis.    She feels her swelling is decreased.  She still has some difficulty swallowing secretions, but this is getting easier.  She denies chest pain, dyspnea, nausea, or abdominal pain.  She has mild periorbital swelling, MP 2 airway, lungs clear, heart rate regular and tachycardic, abdomen soft, no rashes.  Will admit to ICU.  Tx with solumedrol, benadryl, pepcid.  NPO for now >> continue IV fluids.  She will need to get new epi pen prior to d/c home, and should have f/u with allergist after d/c home.  CC time by me independent of APP time 40 minutes.  Chesley Mires, MD [facility] Pulmonary/Critical Care 03/02/2014, 10:27 PM Pager:  [phone] After 3pm call: [phone]

## 2014-03-02 NOTE — ED Notes (Signed)
Pt responding to medications. Breathing easier, color has improved, less discomfort in her throat.

## 2014-03-02 NOTE — Progress Notes (Signed)
ANTICOAGULATION CONSULT NOTE - Initial Consult  Pharmacy Consult for Lovenox Indication: VTE prophylaxis  Allergies  Allergen Reactions  . 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 Scratch throat. cough  . Celebrex [Celecoxib] Swelling    Facial swelling  . Shellfish Allergy Swelling    Patient Measurements:    Vital Signs: Temp: 98 F (36.7 C) (02/11 2154) Temp Source: Oral (02/11 2154) BP: 147/67 mmHg (02/11 2154) Pulse Rate: 95 (02/11 2154)  Labs: No results for input(s): HGB, HCT, PLT, APTT, LABPROT, INR, HEPARINUNFRC, CREATININE, CKTOTAL, CKMB, TROPONINI in the last 72 hours.  CrCl cannot be calculated (Unknown ideal weight.).   Medical History: Past Medical History  Diagnosis Date  . PONV (postoperative nausea and vomiting)   . Fibromyalgia   . Celiac disease   . Depression   . SVD (spontaneous vaginal delivery)     x 2  . GERD (gastroesophageal reflux disease)     no meds  . Arthritis     hands, knees, neck - no meds    Medications:  Scheduled:  . antiseptic oral rinse  7 mL Mouth Rinse BID  . diphenhydrAMINE  25 mg Intravenous Once  . diphenhydrAMINE  50 mg Intravenous Q6H  . methylPREDNISolone (SOLU-MEDROL) injection  60 mg Intravenous Q6H   Infusions:  . sodium chloride    . famotidine (PEPCID) IV      Assessment: 34 yoF admitted 2/11 with symptoms of anaphylaxis.  Pharmacy is consulted to dose Lovenox for VTE prophylaxis inpatient.     Last documented weight 104.3 kg, height 157 cm (03/30/13)  BMI = 42  Labs pending collection, f/u renal function and CBC  Goal of Therapy:  VTE prophylaxis Monitor platelets by anticoagulation protocol: Yes   Plan:   Lovenox 0.5 mg/kg SQ once daily if CrCl > 30 ml/min  Pharmacy to f/u labs and updated weight when available.  Gretta Arab PharmD, BCPS Pager [phone] 03/02/2014 10:14 PM

## 2014-03-02 NOTE — ED Provider Notes (Signed)
CSN: [id number]     Arrival date & time 03/02/14  2008 History   First MD Initiated Contact with Patient 03/02/14 2018     Chief Complaint  Patient presents with  . Allergic Reaction     (Consider location/radiation/quality/duration/timing/severity/associated sxs/prior Treatment) HPI  59 year old female presents with acute trouble breathing, lip swelling, and throat closing sensation that started a few minutes after drinking a protein shake. She thinks the protein shake may have contained chicory root which she is allergic to. The patient has an anaphylactic reaction to shellfish but her epipen is expired so she didn't use it. Has had facial swelling and rash as well. Patient took Benadryl prior to arrival but no other meds. Patient feels that all of her symptoms have been rapidly progressive.  Past Medical History  Diagnosis Date  . PONV (postoperative nausea and vomiting)   . Fibromyalgia   . Celiac disease   . Depression   . SVD (spontaneous vaginal delivery)     x 2  . GERD (gastroesophageal reflux disease)     no meds  . Arthritis     hands, knees, neck - no meds   Past Surgical History  Procedure Laterality Date  . Spinal fusion      C3-C4  . Dermoid cystect    . Replacement total knee      bilateral  . Hysteroscopy w/d&c  01/28/2012    Procedure: DILATATION AND CURETTAGE /HYSTEROSCOPY;  Surgeon: Thurnell Lose, MD;  Location: [facility];  Service: Gynecology;  Laterality: N/A;  . Wisdom tooth extraction    . Laparoscopic assisted vaginal hysterectomy N/A 03/30/2013    Procedure: LAPAROSCOPIC ASSISTED VAGINAL HYSTERECTOMY;  Surgeon: Thurnell Lose, MD;  Location: [facility];  Service: Gynecology;  Laterality: N/A;  . Bilateral salpingectomy Bilateral 03/30/2013    Procedure: BILATERAL SALPINGECTOMY;  Surgeon: Thurnell Lose, MD;  Location: [facility];  Service: Gynecology;  Laterality: Bilateral;   History reviewed. No pertinent family history. History  Substance Use Topics  .  Smoking status: Never Smoker   . Smokeless tobacco: Never Used  . Alcohol Use: Yes     Comment: occasionally   OB History    No data available     Review of Systems  HENT: Positive for facial swelling, sore throat and voice change.   Respiratory: Positive for shortness of breath.   Gastrointestinal: Negative for vomiting.  Skin: Positive for rash.  All other systems reviewed and are negative.     Allergies  Celebrex and Shellfish allergy  Home Medications   Prior to Admission medications   Medication Sig Start Date End Date Taking? Authorizing Provider  cyclobenzaprine (AMRIX) 15 MG 24 hr capsule Take 15 mg by mouth daily as needed for muscle spasms.    Historical Provider, MD  DULoxetine (CYMBALTA) 60 MG capsule Take 60 mg by mouth daily.    Historical Provider, MD  ibuprofen (ADVIL,MOTRIN) 600 MG tablet Take 1 tablet (600 mg total) by mouth every 6 (six) hours as needed for mild pain, moderate pain or cramping. 03/31/13   Thurnell Lose, MD  metroNIDAZOLE (METROCREAM) 0.75 % cream Apply 1 application topically 2 (two) times daily. Apply to face    Historical Provider, MD  oxyCODONE-acetaminophen (PERCOCET/ROXICET) 5-325 MG per tablet Take 1-2 tablets by mouth every 4 (four) hours as needed for severe pain (moderate to severe pain (when tolerating fluids)). 03/31/13   Thurnell Lose, MD   BP 156/92 mmHg  Pulse 114  Temp(Src) 98.5 F (36.9 C) (Oral)  Resp 22  SpO2 92% Physical Exam  Constitutional: She is oriented to person, place, and time. She appears well-developed and well-nourished. She appears ill. She appears distressed.  HENT:  Head: Normocephalic and atraumatic.  Right Ear: External ear normal.  Left Ear: External ear normal.  Nose: Nose normal.  Diffuse facial swelling with maculopapular rash. Injected conjunctiva bilaterally. Patient's tongue exhibits mild to moderate swelling, especially at the base. Uvula mildly swollen  Eyes: Right eye exhibits no discharge.  Left eye exhibits no discharge.  Neck: Neck supple.  Cardiovascular: Normal rate, regular rhythm and normal heart sounds.   Pulmonary/Chest: Effort normal. She has wheezes (Scant expiratory wheezes).  Abdominal: Soft. There is no tenderness.  Musculoskeletal: She exhibits no edema.  Neurological: She is alert and oriented to person, place, and time.  Skin: Skin is warm and dry.  Nursing note and vitals reviewed.   ED Course  Procedures (including critical care time) Labs Review Labs Reviewed - No data to display  Imaging Review No results found.   EKG Interpretation None      CRITICAL CARE Performed by: Sherwood Gambler T   Total critical care time: 60 minutes  Critical care time was exclusive of separately billable procedures and treating other patients.  Critical care was necessary to treat or prevent imminent or life-threatening deterioration.  Critical care was time spent personally by me on the following activities: development of treatment plan with patient and/or surrogate as well as nursing, discussions with consultants, evaluation of patient's response to treatment, examination of patient, obtaining history from patient or surrogate, ordering and performing treatments and interventions, ordering and review of laboratory studies, ordering and review of radiographic studies, pulse oximetry and re-evaluation of patient's condition.  MDM   Final diagnoses:  Anaphylaxis, initial encounter    Patient's presentation consistent with anaphylaxis. Given epinephrine IM shortly after arrival. Patient felt like her shortness of breath somewhat improved but her tongue swelling seemed to be worsening per her. No large or obstructing tongue but it does appear moderately swollen. Lips are of normal size. Due to this she was redosed with IM epinephrine. Preparations were made for possible IV epinephrine infusion but the patient did start to slowly get better. Patient's facial flushing  and swelling also improved with other treatments. At this point she still feels like her tongue is somewhat swollen and there is mild edema, given this critical care was consult today and will admit the patient to ICU for close airway monitoring.    Ephraim Hamburger, MD 03/02/14 [phone]

## 2014-03-02 NOTE — ED Notes (Signed)
Pt placed on 2L of Oxygen

## 2014-03-03 LAB — MRSA PCR SCREENING: MRSA by PCR: NEGATIVE

## 2014-03-03 MED ORDER — ENOXAPARIN SODIUM 60 MG/0.6ML ~~LOC~~ SOLN
60.0000 mg | Freq: Every day | SUBCUTANEOUS | Status: DC
  Administered 2014-03-03: 60 mg via SUBCUTANEOUS
  Filled 2014-03-03: qty 0.6

## 2014-03-03 MED ORDER — DIPHENHYDRAMINE HCL 50 MG PO CAPS
50.0000 mg | ORAL_CAPSULE | Freq: Four times a day (QID) | ORAL | Status: DC
  Administered 2014-03-03: 50 mg via ORAL
  Filled 2014-03-03: qty 1

## 2014-03-03 MED ORDER — DIPHENHYDRAMINE HCL 25 MG PO CAPS: 25.0000 mg | ORAL_CAPSULE | Freq: Four times a day (QID) | ORAL | Status: DC | PRN

## 2014-03-03 MED ORDER — FAMOTIDINE 40 MG PO TABS: 40.0000 mg | ORAL_TABLET | Freq: Two times a day (BID) | ORAL | Status: DC

## 2014-03-03 MED ORDER — PREDNISONE 20 MG PO TABS
50.0000 mg | ORAL_TABLET | Freq: Once | ORAL | Status: AC
  Administered 2014-03-03: 50 mg via ORAL
  Filled 2014-03-03: qty 2
  Filled 2014-03-03: qty 1

## 2014-03-03 MED ORDER — EPINEPHRINE 0.3 MG/0.3ML IJ SOAJ: 0.3000 mg | Freq: Once | INTRAMUSCULAR | Status: DC

## 2014-03-03 MED ORDER — PREDNISONE 10 MG PO TABS: ORAL_TABLET | ORAL | Status: DC

## 2014-03-03 MED ORDER — PREDNISOLONE 5 MG PO TABS
50.0000 mg | ORAL_TABLET | Freq: Every day | ORAL | Status: DC
  Filled 2014-03-03: qty 10

## 2014-03-03 NOTE — Progress Notes (Signed)
03/03/14  1508  Reviewed discharge instructions with patient. Patient verbalized understanding of discharge instructions. Copy of discharge instructions and prescription given to patient.

## 2014-03-03 NOTE — Progress Notes (Signed)
   Name: Mary [name] MRN: [id number] DOB: 09-Oct-1955    ADMISSION DATE:  03/02/2014 CONSULTATION DATE:  03/02/2014  REFERRING MD :  EDP  CHIEF COMPLAINT:  Anaphylaxis  BRIEF PATIENT DESCRIPTION: 59 year old female presented to ED 2/11 c/o allergic reaction after drinking new protein shake. Felt throat closing, IM epi given in ED x 2 with improvement. PCCM to admit.   SIGNIFICANT EVENTS   STUDIES:   SUBJECTIVE:  Feels much better  VITAL SIGNS: Temp:  [98 F (36.7 C)-98.9 F (37.2 C)] 98.1 F (36.7 C) (02/12 0335) Pulse Rate:  [74-114] 95 (02/12 0900) Resp:  [11-22] 13 (02/12 0900) BP: (113-156)/(53-92) 142/91 mmHg (02/12 0900) SpO2:  [92 %-100 %] 94 % (02/12 0900) Weight:  [112.5 kg (248 lb 0.3 oz)] 112.5 kg (248 lb 0.3 oz) (02/12 0043) 2 liters  PHYSICAL EXAMINATION: General:  Obese female in NAD, sitting in bed Neuro:  Alert, oriented, non-focal HEENT:  [place]/AT, PERRL, no JVD noted. Oropharyngeal erythema and mild tongue edema-->much better  Cardiovascular:  RRR, no MRG Lungs:  Clear bilateral breath sounds Abdomen:  Soft, non-tender, non-distended Musculoskeletal:  No acute deformity or ROM limitation Skin:  Grossly intact, uticaria (resovling)   Recent Labs Lab 03/02/14 2231  NA 138  K 3.7  CL 102  CO2 25  BUN 12  CREATININE 0.76  GLUCOSE 138*    Recent Labs Lab 03/02/14 2231  HGB 13.2  HCT 41.7  WBC 7.8  PLT 348   Dg Chest Port 1 View  03/02/2014   CLINICAL DATA:  Status post allergic reaction, with shortness of breath, and facial, throat and tongue swelling. Initial encounter.  EXAM: PORTABLE CHEST - 1 VIEW  COMPARISON:  Chest radiograph performed 07/15/2010, and CT of the chest performed 02/11/2013  FINDINGS: The lungs are well-aerated. Minimal bibasilar atelectasis is noted. There is no evidence of pleural effusion or pneumothorax.  The cardiomediastinal silhouette is within normal limits. No acute osseous abnormalities are seen. There is a chronic  fracture involving the distal right clavicle, with mild associated degenerative change.  IMPRESSION: Minimal bibasilar atelectasis noted; lungs otherwise clear.   Electronically Signed   By: Garald Balding M.D.   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  Much better. No issue w/ oral secretions.   - change to pred: initiate 5d taper   - famotidine 20mg  q 12 hours for 5d  - taper benadryl over next 5d  - resume diet - home this afternoon if feels well  - Will provide new epi-pen at discharge   Erick Colace ACNP-BC [place] Pager # [phone] OR # [phone] if no answer   03/03/2014, 10:23 AM   Attending:  I have seen and examined the patient with nurse practitioner/resident and agree with the note above.   She appears well today. Has recovered. Lungs are clear, no stridor, no swelling.  Plan:  -discharge later today  -avoid chicory root  Roselie Awkward, MD [facility] PCCM Pager: [phone] Cell: [phone] If no response, call [phone]

## 2014-03-03 NOTE — Discharge Summary (Signed)
Physician Discharge Summary       Patient ID: Mary [name] MRN: [id number] DOB/AGE: 61-Aug-1957 61 y.o.  Admit date: 03/02/2014 Discharge date: 03/03/2014  Discharge Diagnoses:  Anaphylaxis  Detailed Hospital Course:  59 year old female with PMH signigicant for Celiac disease, fibromyalgia, depression, presented to [facility] ED 2/11 with allergic reaction. She has had these in past (not as severe as this presentation) and has linked them to chicory root. 2/11 she had a protein shake and immediately noted throat and tongue swelling, hives. She presented to ED for this complaint. In ED she was given typical allergic reaction cocktail including 2 doses of IM epinephrine. She has had some resolution of symptoms with this, however her voice remains abnormal and tongue reamins edematous. There is question of whether or not she should be intubated for airway protection. PCCM to evaluate. She was admitted to the ICU, treated with supplemental oxygen, IV systemic steroids and systemic H2 blockade including scheduled pepcid and benadryl. She was monitored closely for signs of worsening airway compromise which she never developed. By am rounds on 2/12 she felt essentially back to baseline. We kept her until afternoon on 2/12 to ensure she could eat and tolerate activity without issue. She was cleared for d/c as of 2/12 with the following plan of care.     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  Much better. No issue w/ oral secretions.   pred: initiate 5d taper   famotidine 20mg  q 12 hours for 5d PRN benadryl  Will provide new epi-pen at discharge F/u w PCP PRN    Significant Hospital tests/ studies  Consults   Discharge Exam: BP 133/94 mmHg  Pulse 100  Temp(Src) 98 F (36.7 C) (Oral)  Resp 17  Ht 5\' 3"  (1.6 m)  Wt 112.5 kg (248 lb 0.3 oz)  BMI 43.95 kg/m2  SpO2 98%  General: Obese female in NAD, sitting in bed Neuro: Alert, oriented,  non-focal HEENT: [place]/AT, PERRL, no JVD noted. Oropharyngeal erythema and mild tongue edema-->much better  Cardiovascular: RRR, no MRG Lungs: Clear bilateral breath sounds Abdomen: Soft, non-tender, non-distended Musculoskeletal: No acute deformity or ROM limitation Skin: Grossly intact, uticaria (resovling)  Labs at discharge Lab Results  Component Value Date   CREATININE 0.76 03/02/2014   BUN 12 03/02/2014   NA 138 03/02/2014   K 3.7 03/02/2014   CL 102 03/02/2014   CO2 25 03/02/2014   Lab Results  Component Value Date   WBC 7.8 03/02/2014   HGB 13.2 03/02/2014   HCT 41.7 03/02/2014   MCV 78.1 03/02/2014   PLT 348 03/02/2014   Lab Results  Component Value Date   ALT 35 03/02/2014   AST 27 03/02/2014   ALKPHOS 54 03/02/2014   BILITOT 0.8 03/02/2014   No results found for: INR, PROTIME  Current radiology studies Dg Chest Port 1 View  03/02/2014   CLINICAL DATA:  Status post allergic reaction, with shortness of breath, and facial, throat and tongue swelling. Initial encounter.  EXAM: PORTABLE CHEST - 1 VIEW  COMPARISON:  Chest radiograph performed 07/15/2010, and CT of the chest performed 02/11/2013  FINDINGS: The lungs are well-aerated. Minimal bibasilar atelectasis is noted. There is no evidence of pleural effusion or pneumothorax.  The cardiomediastinal silhouette is within normal limits. No acute osseous abnormalities are seen. There is a chronic fracture involving the distal right clavicle, with mild associated degenerative change.  IMPRESSION: Minimal bibasilar atelectasis noted; lungs otherwise  clear.   Electronically Signed   By: Garald Balding M.D.   On: 03/02/2014 22:14    Disposition:  01-Home or Self Care      Discharge Instructions    Diet - low sodium heart healthy    Complete by:  As directed      Increase activity slowly    Complete by:  As directed             Medication List    TAKE these medications        DAYQUIL PO  Take 1 tablet by  mouth every 4 (four) hours.     diphenhydrAMINE 25 mg capsule  Commonly known as:  BENADRYL  Take 1 capsule (25 mg total) by mouth every 6 (six) hours as needed.     DULoxetine 60 MG capsule  Commonly known as:  CYMBALTA  Take 60 mg by mouth daily.     EPINEPHrine 0.3 mg/0.3 mL Soaj injection  Commonly known as:  EPIPEN 2-PAK  Inject 0.3 mLs (0.3 mg total) into the muscle once.     famotidine 40 MG tablet  Commonly known as:  PEPCID  Take 1 tablet (40 mg total) by mouth 2 (two) times daily.     FISH OIL PO  Take 1 tablet by mouth daily.     ibuprofen 600 MG tablet  Commonly known as:  ADVIL,MOTRIN  Take 1 tablet (600 mg total) by mouth every 6 (six) hours as needed for mild pain, moderate pain or cramping.     multivitamin with minerals Tabs tablet  Take 1 tablet by mouth daily.     oxyCODONE-acetaminophen 5-325 MG per tablet  Commonly known as:  PERCOCET/ROXICET  Take 1-2 tablets by mouth every 4 (four) hours as needed for severe pain (moderate to severe pain (when tolerating fluids)).     predniSONE 10 MG tablet  Commonly known as:  DELTASONE  Take 5 tabs qdx1d, then 4 tabs qdx1d, then 3 tabs qdx1d, then 2 tabs qdx1d, then 1 tab qdx1d.       Follow-up Information    Follow up with Vidal Schwalbe, MD.   Specialty:  Family Medicine   Why:  As needed   Contact information:   [address] [place] [address] [phone]       Discharged Condition: good  Physician Statement:   The Patient was personally examined, the discharge assessment and plan has been personally reviewed and I agree with ACNP Miguelina Fore's assessment and plan. > 30 minutes of time have been dedicated to discharge assessment, planning and discharge instructions.   SignedMarni Griffon 03/03/2014, 2:29 PM

## 2014-03-03 NOTE — Discharge Instructions (Signed)
Start the prednisone on Saturday  Start the pepcid tonight

## 2014-03-03 NOTE — Progress Notes (Signed)
CARE MANAGEMENT NOTE 03/03/2014  Patient:  Mary [name], Mary [name]   Account Number:  [id number]  Date Initiated:  03/03/2014  Documentation initiated by:  Nai Dasch  Subjective/Objective Assessment:   anaphyilatic reaction to drinking a protein shake with resp distress.     Action/Plan:   home when stable   Anticipated DC Date:  03/06/2014   Anticipated DC Plan:  HOME/SELF CARE  In-house referral  NA      DC Planning Services  CM consult      PAC Choice  NA   Choice offered to / List presented to:             Status of service:  In process, will continue to follow Medicare Important Message given?   (If response is "NO", the following Medicare IM given date fields will be blank) Date Medicare IM given:   Medicare IM given by:   Date Additional Medicare IM given:   Additional Medicare IM given by:    Discharge Disposition:    Per UR Regulation:  Reviewed for med. necessity/level of care/duration of stay  If discussed at [place] of Stay Meetings, dates discussed:    Comments:  Feb. 12 2016/Aby Gessel L. Rosana Hoes, RN, BSN, CCM/Case Management [place] [phone] No discharge needs present of time of review.

## 2014-07-10 ENCOUNTER — Other Ambulatory Visit: Payer: Self-pay

## 2014-07-12 ENCOUNTER — Other Ambulatory Visit: Payer: Self-pay | Admitting: Family Medicine

## 2014-07-12 DIAGNOSIS — N644 Mastodynia: Secondary | ICD-10-CM

## 2014-07-12 DIAGNOSIS — N6310 Unspecified lump in the right breast, unspecified quadrant: Secondary | ICD-10-CM

## 2014-08-07 ENCOUNTER — Ambulatory Visit
Admission: RE | Admit: 2014-08-07 | Discharge: 2014-08-07 | Disposition: A | Discharge status: Home / Self Care | Payer: 59 | Source: Ambulatory Visit | Attending: Family Medicine | Admitting: Family Medicine

## 2014-08-07 DIAGNOSIS — N644 Mastodynia: Secondary | ICD-10-CM

## 2014-08-07 DIAGNOSIS — N6310 Unspecified lump in the right breast, unspecified quadrant: Secondary | ICD-10-CM

## 2014-12-06 DIAGNOSIS — G5602 Carpal tunnel syndrome, left upper limb: Secondary | ICD-10-CM | POA: Insufficient documentation

## 2014-12-06 DIAGNOSIS — M4322 Fusion of spine, cervical region: Secondary | ICD-10-CM | POA: Insufficient documentation

## 2014-12-13 DIAGNOSIS — M151 Heberden's nodes (with arthropathy): Secondary | ICD-10-CM | POA: Insufficient documentation

## 2015-02-09 ENCOUNTER — Encounter (HOSPITAL_COMMUNITY): Payer: Self-pay

## 2015-02-09 ENCOUNTER — Emergency Department (HOSPITAL_COMMUNITY)
Admission: EM | Admit: 2015-02-09 | Discharge: 2015-02-09 | Disposition: A | Discharge status: Home / Self Care | Payer: 59 | Attending: Emergency Medicine | Admitting: Emergency Medicine

## 2015-02-09 DIAGNOSIS — K1379 Other lesions of oral mucosa: Secondary | ICD-10-CM | POA: Diagnosis not present

## 2015-02-09 DIAGNOSIS — Y998 Other external cause status: Secondary | ICD-10-CM | POA: Diagnosis not present

## 2015-02-09 DIAGNOSIS — Z79899 Other long term (current) drug therapy: Secondary | ICD-10-CM | POA: Diagnosis not present

## 2015-02-09 DIAGNOSIS — Z880 Allergy status to penicillin: Secondary | ICD-10-CM | POA: Insufficient documentation

## 2015-02-09 DIAGNOSIS — L820 Inflamed seborrheic keratosis: Secondary | ICD-10-CM | POA: Diagnosis not present

## 2015-02-09 DIAGNOSIS — M1990 Unspecified osteoarthritis, unspecified site: Secondary | ICD-10-CM | POA: Diagnosis not present

## 2015-02-09 DIAGNOSIS — Y9289 Other specified places as the place of occurrence of the external cause: Secondary | ICD-10-CM | POA: Diagnosis not present

## 2015-02-09 DIAGNOSIS — F329 Major depressive disorder, single episode, unspecified: Secondary | ICD-10-CM | POA: Diagnosis not present

## 2015-02-09 DIAGNOSIS — Y9389 Activity, other specified: Secondary | ICD-10-CM | POA: Diagnosis not present

## 2015-02-09 DIAGNOSIS — L700 Acne vulgaris: Secondary | ICD-10-CM | POA: Diagnosis not present

## 2015-02-09 DIAGNOSIS — K219 Gastro-esophageal reflux disease without esophagitis: Secondary | ICD-10-CM | POA: Insufficient documentation

## 2015-02-09 DIAGNOSIS — X58XXXA Exposure to other specified factors, initial encounter: Secondary | ICD-10-CM | POA: Diagnosis not present

## 2015-02-09 DIAGNOSIS — T7840XA Allergy, unspecified, initial encounter: Secondary | ICD-10-CM | POA: Insufficient documentation

## 2015-02-09 MED ORDER — FAMOTIDINE IN NACL 20-0.9 MG/50ML-% IV SOLN
20.0000 mg | Freq: Once | INTRAVENOUS | Status: AC
  Administered 2015-02-09: 20 mg via INTRAVENOUS
  Filled 2015-02-09: qty 50

## 2015-02-09 MED ORDER — PREDNISONE 10 MG PO TABS: 40.0000 mg | ORAL_TABLET | Freq: Every day | ORAL | Status: DC

## 2015-02-09 MED ORDER — RANITIDINE HCL 150 MG PO TABS: 150.0000 mg | ORAL_TABLET | Freq: Two times a day (BID) | ORAL | Status: DC

## 2015-02-09 MED ORDER — DIPHENHYDRAMINE HCL 50 MG/ML IJ SOLN
50.0000 mg | Freq: Once | INTRAMUSCULAR | Status: AC
  Administered 2015-02-09: 50 mg via INTRAVENOUS
  Filled 2015-02-09: qty 1

## 2015-02-09 MED ORDER — METHYLPREDNISOLONE SODIUM SUCC 125 MG IJ SOLR
125.0000 mg | Freq: Once | INTRAMUSCULAR | Status: AC
  Administered 2015-02-09: 125 mg via INTRAVENOUS
  Filled 2015-02-09: qty 2

## 2015-02-09 MED ORDER — DIPHENHYDRAMINE HCL 25 MG PO TABS: 25.0000 mg | ORAL_TABLET | Freq: Four times a day (QID) | ORAL | Status: DC

## 2015-02-09 MED FILL — raNITIdine HCL 150 MG TABS: 150 | 5 days supply | Qty: 10 | Fill #0

## 2015-02-09 MED FILL — predniSONE 10 MG TABS: 10 | 4 days supply | Qty: 16 | Fill #0

## 2015-02-09 NOTE — ED Provider Notes (Signed)
CSN: [id number]     Arrival date & time 02/09/15  0957 History   First MD Initiated Contact with Patient 02/09/15 1009     No chief complaint on file.    (Consider location/radiation/quality/duration/timing/severity/associated sxs/prior Treatment) Patient is a 60 y.o. female presenting with allergic reaction. The history is provided by the patient.  Allergic Reaction Presenting symptoms: swelling (in mouth and throat)   Swelling:    Location:  Mouth   Onset quality:  Sudden   Timing:  Constant   Progression:  Unchanged   Chronicity:  New Severity:  Moderate Prior episodes: shellfish, NSAIDs, chickory root. Context: food (ate a breakfast bar and coffe this morning)   Relieved by:  Nothing Worsened by:  Nothing tried Ineffective treatments:  None tried   Past Medical History  Diagnosis Date  . PONV (postoperative nausea and vomiting)   . Fibromyalgia   . Celiac disease   . Depression   . SVD (spontaneous vaginal delivery)     x 2  . GERD (gastroesophageal reflux disease)     no meds  . Arthritis     hands, knees, neck - no meds  . Anaphylaxis     Chicory root, shellfish   Past Surgical History  Procedure Laterality Date  . Spinal fusion      C3-C4  . Dermoid cystect    . Replacement total knee      bilateral  . Hysteroscopy w/d&c  01/28/2012    Procedure: DILATATION AND CURETTAGE /HYSTEROSCOPY;  Surgeon: Thurnell Lose, MD;  Location: [facility];  Service: Gynecology;  Laterality: N/A;  . Wisdom tooth extraction    . Laparoscopic assisted vaginal hysterectomy N/A 03/30/2013    Procedure: LAPAROSCOPIC ASSISTED VAGINAL HYSTERECTOMY;  Surgeon: Thurnell Lose, MD;  Location: [facility];  Service: Gynecology;  Laterality: N/A;  . Bilateral salpingectomy Bilateral 03/30/2013    Procedure: BILATERAL SALPINGECTOMY;  Surgeon: Thurnell Lose, MD;  Location: [facility];  Service: Gynecology;  Laterality: Bilateral;   History reviewed. No pertinent family history. Social History  Substance  Use Topics  . Smoking status: Never Smoker   . Smokeless tobacco: Never Used  . Alcohol Use: Yes     Comment: occasionally   OB History    No data available     Review of Systems  All other systems reviewed and are negative.     Allergies  Aspirin; Chicory root; Nsaids; Other; Shellfish allergy; and Celebrex  Home Medications   Prior to Admission medications   Medication Sig Start Date End Date Taking? Authorizing Provider  acetaminophen (TYLENOL) 500 MG tablet Take 1,000 mg by mouth daily as needed for headache.   Yes Historical Provider, MD  diclofenac sodium (VOLTAREN) 1 % GEL Apply 2 g topically daily as needed (for pain).   Yes Historical Provider, MD  diphenhydrAMINE (BENADRYL) 25 mg capsule Take 1 capsule (25 mg total) by mouth every 6 (six) hours as needed. Patient taking differently: Take 25 mg by mouth every 6 (six) hours as needed for itching or allergies.  03/03/14  Yes Erick Colace, NP  DULoxetine (CYMBALTA) 60 MG capsule Take 60 mg by mouth at bedtime.    Yes Historical Provider, MD  EPINEPHrine (EPIPEN 2-PAK) 0.3 mg/0.3 mL IJ SOAJ injection Inject 0.3 mLs (0.3 mg total) into the muscle once. 03/03/14  Yes Erick Colace, NP  loratadine (CLARITIN) 10 MG tablet Take 10 mg by mouth daily as needed for allergies.   Yes Historical Provider, MD  Multiple Vitamin (MULTIVITAMIN  WITH MINERALS) TABS tablet Take 1 tablet by mouth daily.   Yes Historical Provider, MD  Omega-3 Fatty Acids (FISH OIL PO) Take 1 tablet by mouth daily.   Yes Historical Provider, MD  famotidine (PEPCID) 40 MG tablet Take 1 tablet (40 mg total) by mouth 2 (two) times daily. Patient not taking: Reported on 02/09/2015 03/03/14   Erick Colace, NP  ibuprofen (ADVIL,MOTRIN) 600 MG tablet Take 1 tablet (600 mg total) by mouth every 6 (six) hours as needed for mild pain, moderate pain or cramping. Patient not taking: Reported on 03/02/2014 03/31/13   Thurnell Lose, MD  oxyCODONE-acetaminophen  (PERCOCET/ROXICET) 5-325 MG per tablet Take 1-2 tablets by mouth every 4 (four) hours as needed for severe pain (moderate to severe pain (when tolerating fluids)). Patient not taking: Reported on 03/02/2014 03/31/13   Thurnell Lose, MD  predniSONE (DELTASONE) 10 MG tablet Take 5 tabs qdx1d, then 4 tabs qdx1d, then 3 tabs qdx1d, then 2 tabs qdx1d, then 1 tab qdx1d. Patient not taking: Reported on 02/09/2015 03/03/14   Erick Colace, NP   BP 134/80 mmHg  Pulse 81  Temp(Src) 98.2 F (36.8 C) (Oral)  Resp 18  SpO2 94% Physical Exam  Constitutional: She is oriented to person, place, and time. She appears well-developed and well-nourished. No distress.  HENT:  Head: Normocephalic.  Mild uvular and mucosal edema without narrowing of airway. Floor of mouth is soft, non-tender  Eyes: Conjunctivae are normal.  Neck: Neck supple. No tracheal deviation present.  Cardiovascular: Normal rate, regular rhythm and normal heart sounds.   Pulmonary/Chest: Effort normal and breath sounds normal. No stridor. No respiratory distress. She has no wheezes. She has no rales.  Abdominal: Soft. She exhibits no distension.  Neurological: She is alert and oriented to person, place, and time.  Skin: Skin is warm and dry.  Psychiatric: She has a normal mood and affect.  Vitals reviewed.   ED Course  Procedures (including critical care time) Labs Review Labs Reviewed - No data to display  Imaging Review No results found. I have personally reviewed and evaluated these images and lab results as part of my medical decision-making.   EKG Interpretation None      MDM   Final diagnoses:  Allergic reaction, initial encounter  Uvular edema    60 y.o. female presents with apparent allergic reaction c/w prior episodes which have occurred with certain foods. No known allergen ingestion. No multisystem involvement or VS abnormality to suggest anaphylaxis. Given H!/H2 blockers and steroids with improvement of  symptoms in ED. Plan to schedule these medications, carry her epipen with her and administer PRN, and return immediately with new symptoms or worsening.    Leo Grosser, MD 02/09/15 [phone]

## 2015-02-09 NOTE — ED Notes (Signed)
Pt presents with swelling in mouth, wheezing after eating a homemade cookie bar at 0830.  Pt reports multiple allergies, does not have her epi-pen.

## 2015-02-09 NOTE — Discharge Instructions (Signed)
Allergies An allergy is an abnormal reaction to a substance by the body's defense system (immune system). Allergies can develop at any age. WHAT CAUSES ALLERGIES? An allergic reaction happens when the immune system mistakenly reacts to a normally harmless substance, called an allergen, as if it were harmful. The immune system releases antibodies to fight the substance. Antibodies eventually release a chemical called histamine into the bloodstream. The release of histamine is meant to protect the body from infection, but it also causes discomfort. An allergic reaction can be triggered by:  Eating an allergen.  Inhaling an allergen.  Touching an allergen. WHAT TYPES OF ALLERGIES ARE THERE? There are many types of allergies. Common types include:  Seasonal allergies. People with this type of allergy are usually allergic to substances that are only present during certain seasons, such as molds and pollens.  Food allergies.  Drug allergies.  Insect allergies.  Animal dander allergies. WHAT ARE SYMPTOMS OF ALLERGIES? Possible allergy symptoms include:  Swelling of the lips, face, tongue, mouth, or throat.  Sneezing, coughing, or wheezing.  Nasal congestion.  Tingling in the mouth.  Rash.  Itching.  Itchy, red, swollen areas of skin (hives).  Watery eyes.  Vomiting.  Diarrhea.  Dizziness.  Lightheadedness.  Fainting.  Trouble breathing or swallowing.  Chest tightness.  Rapid heartbeat. HOW ARE ALLERGIES DIAGNOSED? Allergies are diagnosed with a medical and family history and one or more of the following:  Skin tests.  Blood tests.  A food diary. A food diary is a record of all the foods and drinks you have in a day and of all the symptoms you experience.  The results of an elimination diet. An elimination diet involves eliminating foods from your diet and then adding them back in one by one to find out if a certain food causes an allergic reaction. HOW ARE  ALLERGIES TREATED? There is no cure for allergies, but allergic reactions can be treated with medicine. Severe reactions usually need to be treated at a hospital. HOW CAN REACTIONS BE PREVENTED? The best way to prevent an allergic reaction is by avoiding the substance you are allergic to. Allergy shots and medicines can also help prevent reactions in some cases. People with severe allergic reactions may be able to prevent a life-threatening reaction called anaphylaxis with a medicine given right after exposure to the allergen.   This information is not intended to replace advice given to you by your health care provider. Make sure you discuss any questions you have with your health care provider.   Document Released: 04/01/2002 Document Revised: 01/27/2014 Document Reviewed: 10/18/2013 Elsevier Interactive Patient Education 2016 [place] your EpiPen with you at all times.

## 2015-02-22 MED FILL — AMOXICILLIN 500 MG CAPSULE: 500 | 2 days supply | Qty: 8 | Fill #0

## 2015-03-01 DIAGNOSIS — M19042 Primary osteoarthritis, left hand: Secondary | ICD-10-CM | POA: Diagnosis not present

## 2015-03-06 MED FILL — DULoxetine HCL 60 MG CPEP: 60 | 90 days supply | Qty: 90 | Fill #0

## 2015-03-22 DIAGNOSIS — M25522 Pain in left elbow: Secondary | ICD-10-CM | POA: Diagnosis not present

## 2015-03-22 DIAGNOSIS — M25521 Pain in right elbow: Secondary | ICD-10-CM | POA: Diagnosis not present

## 2015-03-22 DIAGNOSIS — M25542 Pain in joints of left hand: Secondary | ICD-10-CM | POA: Diagnosis not present

## 2015-03-22 DIAGNOSIS — M2540 Effusion, unspecified joint: Secondary | ICD-10-CM | POA: Diagnosis not present

## 2015-03-22 DIAGNOSIS — M130 Polyarthritis, unspecified: Secondary | ICD-10-CM | POA: Diagnosis not present

## 2015-03-22 DIAGNOSIS — G894 Chronic pain syndrome: Secondary | ICD-10-CM | POA: Diagnosis not present

## 2015-03-22 DIAGNOSIS — M25541 Pain in joints of right hand: Secondary | ICD-10-CM | POA: Diagnosis not present

## 2015-03-22 DIAGNOSIS — M25571 Pain in right ankle and joints of right foot: Secondary | ICD-10-CM | POA: Diagnosis not present

## 2015-04-23 DIAGNOSIS — F322 Major depressive disorder, single episode, severe without psychotic features: Secondary | ICD-10-CM | POA: Diagnosis not present

## 2015-04-23 DIAGNOSIS — M797 Fibromyalgia: Secondary | ICD-10-CM | POA: Diagnosis not present

## 2015-06-13 MED FILL — DULoxetine HCL 60 MG CPEP: 60 | 90 days supply | Qty: 90 | Fill #0

## 2015-08-06 ENCOUNTER — Other Ambulatory Visit: Payer: Self-pay | Admitting: Family Medicine

## 2015-08-06 DIAGNOSIS — Z1231 Encounter for screening mammogram for malignant neoplasm of breast: Secondary | ICD-10-CM

## 2015-08-08 ENCOUNTER — Ambulatory Visit: Payer: PRIVATE HEALTH INSURANCE | Attending: Family Medicine

## 2015-08-08 DIAGNOSIS — M546 Pain in thoracic spine: Secondary | ICD-10-CM | POA: Diagnosis present

## 2015-08-08 DIAGNOSIS — R293 Abnormal posture: Secondary | ICD-10-CM | POA: Diagnosis present

## 2015-08-08 DIAGNOSIS — R252 Cramp and spasm: Secondary | ICD-10-CM | POA: Insufficient documentation

## 2015-08-08 NOTE — Therapy (Signed)
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 PONV (postoperative nausea and vomiting)   . Fibromyalgia   . Celiac disease   . Depression   . SVD (spontaneous vaginal delivery)     x 2  . GERD (gastroesophageal reflux disease)     no meds  . Arthritis     hands, knees, neck - no meds  . Anaphylaxis     Chicory root, shellfish    Past Surgical History  Procedure Laterality Date  . Spinal fusion      C3-C4  . Dermoid cystect    . Replacement total knee      bilateral  . Hysteroscopy w/d&c  01/28/2012    Procedure: DILATATION AND CURETTAGE /HYSTEROSCOPY;  Surgeon: Thurnell Lose, MD;  Location: [facility];  Service: Gynecology;  Laterality: N/A;  . Wisdom tooth extraction    . Laparoscopic assisted vaginal hysterectomy N/A 03/30/2013    Procedure: LAPAROSCOPIC ASSISTED VAGINAL HYSTERECTOMY;  Surgeon: Thurnell Lose, MD;  Location: [facility];  Service: Gynecology;  Laterality: N/A;  . Bilateral salpingectomy Bilateral 03/30/2013    Procedure: BILATERAL SALPINGECTOMY;  Surgeon: Thurnell Lose, MD;  Location: [facility];  Service: Gynecology;  Laterality: Bilateral;    There were no vitals filed for this visit.       Subjective Assessment - 08/08/15  0709    Subjective She reports her office was moving and she was  lifting a heavy box and felt strain in RT upper back and has had pain since.  She is able to swim without pain but has pain getting out of pool. Is doing exercise issued by MD.    Pertinent History cervical C3-4 fusion, RT and LT knee replacements, OA   Limitations Sitting  reaching , using mouse   How long can you sit comfortably? 45 min   How long can you stand comfortably? as needed   How long can you walk comfortably? not limited by thoracic pain   Diagnostic tests none   Patient Stated Goals To releive pain.    Currently in Pain? Yes   Pain Score 6    Pain Location Thoracic   Pain Orientation Right   Pain Descriptors / Indicators Dull;Aching  painful   Pain Type Acute pain   Pain Radiating Towards posterior upper RT arm and to ulnar forearm   Pain Onset 1 to 4 weeks ago   Pain Frequency Intermittent   Aggravating Factors  Sitting, reaching with RT arm/ using mouse,pain come with activty or positions that do not cause pain .   Pain Relieving Factors tylenol, change positions   Multiple Pain Sites No  [facility] PT Assessment - 08/08/15 0705    Assessment   Medical Diagnosis thoracic strain   Referring Provider Odis Luster, MD   Onset Date/Surgical Date 07/13/15   Next MD Visit 4 weeks   Prior Therapy No   Precautions   Precautions None   Balance Screen   Has the patient fallen in the past 6 months No   Has the patient had a decrease in activity level because of a fear of falling?  No   Is the patient reluctant to leave their home because of a fear of falling?  No   Prior Function   Level of Independence Independent   Cognition   Overall Cognitive Status Within Functional Limits for tasks assessed   Observation/Other Assessments   Focus on Therapeutic Outcomes (FOTO)  47% limited   Posture/Postural Control   Posture Comments forward head  rounded shoulder,  RT shoulder higher than Lt   ROM  / Strength   AROM / PROM / Strength AROM;Strength   AROM   Overall AROM Comments Shoulder motiuon WNL  but has some joint and RT shoulder discomfort with movement   AROM Assessment Site Thoracic   Thoracic Flexion 35   Thoracic Extension 20   Thoracic - Right Rotation 45   Thoracic - Left Rotation 32   Strength   Overall Strength Comments Normal UE    Palpation   Palpation comment tender both sides thoracic spine but with fibromyalgia LT sid was different than RT in area of complaint                   [facility] Adult PT Treatment/Exercise - 08/08/15 0705    Exercises   Exercises Other Exercises   Other Exercises  Thoracic rotation RT and LT 10-20 sec x 3  prone prop stretch x 5 min   Modalities   Modalities Electrical Stimulation;Ultrasound;Moist Heat   Moist Heat Therapy   Number Minutes Moist Heat 12 Minutes   Moist Heat Location Other (comment)  thoracic spine   Manual Therapy   Manual Therapy Joint mobilization   Manual therapy comments Gr 2 PA glides LT side of Thoacic spine x 20 reps T3-10  and RT x 15 reps                PT Education - 08/08/15 0737    Education provided Yes   Education Details POC, HEP   Person(s) Educated Patient   Methods Explanation;Demonstration;Verbal cues;Handout;Tactile cues   Comprehension Verbalized understanding;Returned demonstration          PT Short Term Goals - 08/08/15 0737    PT SHORT TERM GOAL #1   Title She will be independent with intial HEP   Time 1   Period Weeks   Status New           PT Long Term Goals - 08/08/15 [id number]    PT LONG TERM GOAL #1   Title She will be independent with all HEP issued   Time 6   Period Weeks   Status New   PT LONG TERM GOAL #2   Title She will report pain  75% or more improved with sitting and using mouse at work and home.    Time 6   Period Weeks   Status New   PT LONG TERM GOAL #3   Title She will report no symptoms down RT arm.    Time 6   Period Weeks    Status New   PT  LONG TERM GOAL #4   Title She will hae no pain after swimming for exercise   Time 6   Period Weeks   Status New               Plan - 08/08/15 0702    Clinical Impression Statement Ms Giannantonio presents for low complexity evaluation with reports of pain in thoracic spine , abnormal posture , muscle spasm ,decreased ROM limiting normal activity   Rehab Potential Good   PT Frequency 2x / week   PT Duration 3 weeks   PT Treatment/Interventions Cryotherapy;Electrical Stimulation;Iontophoresis 4mg /ml Dexamethasone;Moist Heat;Ultrasound;Therapeutic exercise;Patient/family education;Taping;Manual techniques;Dry needling;Passive range of motion   PT Next Visit Plan modalites and manual for pain   PT Home Exercise Plan streching   Consulted and Agree with Plan of Care Patient      Patient will benefit from skilled therapeutic intervention in order to improve the following deficits and impairments:  Pain, Postural dysfunction, Decreased activity tolerance  Visit Diagnosis: Pain in thoracic spine - Plan: PT plan of care cert/re-cert  Abnormal posture - Plan: PT plan of care cert/re-cert  Cramp and spasm - Plan: PT plan of care cert/re-cert     Problem List Patient Active Problem List   Diagnosis Date Noted  . Anaphylaxis 03/02/2014  . S/P laparoscopic assisted vaginal hysterectomy (LAVH) 03/30/2013    Darrel Hoover  PT 08/08/2015, 8:10 AM  [facility] [address] [place], [place], [address] Phone: [phone]   Fax:  [phone]  Name: Mary [name] MRN: [id number] Date of Birth: 04-Mar-1955

## 2015-08-08 NOTE — Patient Instructions (Signed)
From cabinet  Prone extension x 3-5 min 1-2x/day,  thoracic rotation RT and LT x 2-3 10-20 sec  2 x/day, ball issued for  Spine mobs

## 2015-08-10 ENCOUNTER — Ambulatory Visit: Payer: PRIVATE HEALTH INSURANCE | Attending: Family Medicine

## 2015-08-10 DIAGNOSIS — M546 Pain in thoracic spine: Secondary | ICD-10-CM | POA: Diagnosis not present

## 2015-08-10 DIAGNOSIS — R293 Abnormal posture: Secondary | ICD-10-CM | POA: Diagnosis not present

## 2015-08-10 DIAGNOSIS — R252 Cramp and spasm: Secondary | ICD-10-CM | POA: Insufficient documentation

## 2015-08-10 NOTE — Patient Instructions (Addendum)
From cabinet issued yellow band for abduction and scap depression  2x/day x 10 and shoulder extension with scap retraction x 110 reps .    Also to remove tape if irritating and can leave on if helpful until Monday PM. Also to shower if needed.Can get tape wet

## 2015-08-10 NOTE — Therapy (Signed)
[place], [place], [address] Phone: [phone]   Fax:  [phone]  Physical Therapy Treatment  Patient Details  Name: Mary [name] MRN: [id number] Date of Birth: [birth year] Referring Provider: Odis Luster, MD  Encounter Date: 08/10/2015      PT End of Session - 08/10/15 0807    Visit Number 2   Number of Visits 12   Date for PT Re-Evaluation 09/21/15   Authorization Type Worker compensation   Authorization - Visit Number 2   Authorization - Number of Visits 6   PT Start Time 0700   PT Stop Time 0750   PT Time Calculation (min) 50 min   Activity Tolerance Patient tolerated treatment well;Patient limited by pain   Behavior During Therapy [facility] for tasks assessed/performed      Past Medical History  Diagnosis Date  . PONV (postoperative nausea and vomiting)   . Fibromyalgia   . Celiac disease   . Depression   . SVD (spontaneous vaginal delivery)     x 2  . GERD (gastroesophageal reflux disease)     no meds  . Arthritis     hands, knees, neck - no meds  . Anaphylaxis     Chicory root, shellfish    Past Surgical History  Procedure Laterality Date  . Spinal fusion      C3-C4  . Dermoid cystect    . Replacement total knee      bilateral  . Hysteroscopy w/d&c  01/28/2012    Procedure: DILATATION AND CURETTAGE /HYSTEROSCOPY;  Surgeon: Thurnell Lose, MD;  Location: [facility];  Service: Gynecology;  Laterality: N/A;  . Wisdom tooth extraction    . Laparoscopic assisted vaginal hysterectomy N/A 03/30/2013    Procedure: LAPAROSCOPIC ASSISTED VAGINAL HYSTERECTOMY;  Surgeon: Thurnell Lose, MD;  Location: [facility];  Service: Gynecology;  Laterality: N/A;  . Bilateral salpingectomy Bilateral 03/30/2013    Procedure: BILATERAL SALPINGECTOMY;  Surgeon: Thurnell Lose, MD;  Location: [facility];  Service: Gynecology;  Laterality: Bilateral;    There were no vitals filed for this visit.      Subjective  Assessment - 08/10/15 0705    Subjective PAin in RT forearm now and continued pain in RT scapula .    Currently in Pain? Yes   Pain Score 6    Pain Location Thoracic   Pain Orientation Right   Pain Descriptors / Indicators Aching;Dull   Pain Type Acute pain   Pain Onset 1 to 4 weeks ago   Pain Frequency Intermittent   Aggravating Factors  using right arm   Pain Relieving Factors meds change position   Multiple Pain Sites No                         [facility] Adult PT Treatment/Exercise - 08/10/15 0001    Exercises   Exercises Shoulder   Shoulder Exercises: Standing   ABduction Both;10 reps   Theraband Level (Shoulder ABduction) Level 1 (Yellow)   ABduction Limitations short motion with depression of scapula   Extension Both;12 reps   Theraband Level (Shoulder Extension) Level 1 (Yellow)   Shoulder Exercises: ROM/Strengthening   UBE (Upper Arm Bike) 5 min L1 forward   Shoulder Exercises: Stretch   Other Shoulder Stretches Rhomboid RT with cross chest and hold to pole 30 sec x 3   Manual Therapy   Manual Therapy Soft tissue mobilization;Taping   Soft tissue mobilization trigger point release RT rhomboid  30 sec x 2 .    Kinesiotex Inhibit Muscle   Kinesiotix   Inhibit Muscle  rhohmboid and RT forearm extension group stretched before applying tape                PT Education - 08/10/15 0805    Education provided Yes   Education Details band exercises and manage tape    Person(s) Educated Patient   Methods Explanation;Tactile cues;Verbal cues;Handout;Demonstration   Comprehension Verbalized understanding;Returned demonstration          PT Short Term Goals - 08/08/15 0737    PT SHORT TERM GOAL #1   Title She will be independent with intial HEP   Time 1   Period Weeks   Status New           PT Long Term Goals - 08/08/15 [id number]    PT LONG TERM GOAL #1   Title She will be independent with all HEP issued   Time 6   Period Weeks   Status New    PT LONG TERM GOAL #2   Title She will report pain  75% or more improved with sitting and using mouse at work and home.    Time 6   Period Weeks   Status New   PT LONG TERM GOAL #3   Title She will report no symptoms down RT arm.    Time 6   Period Weeks   Status New   PT LONG TERM GOAL #4   Title She will hae no pain after swimming for exercise   Time 6   Period Weeks   Status New               Plan - 08/10/15 [id number]    Clinical Impression Statement She reported inc pain post but declined heat post as she needed to get to work. Rhomboid area is most tender and actually causes related pain into RT forearm. She may benfit from dry needling   PT Treatment/Interventions Cryotherapy;Electrical Stimulation;Iontophoresis 4mg /ml Dexamethasone;Moist Heat;Ultrasound;Therapeutic exercise;Patient/family education;Taping;Manual techniques;Dry needling;Passive range of motion   PT Next Visit Plan assess tape if helpful, modalities for pain , dry needle, review HEP   PT Home Exercise Plan band exercise   Consulted and Agree with Plan of Care Patient      Patient will benefit from skilled therapeutic intervention in order to improve the following deficits and impairments:  Pain, Postural dysfunction, Decreased activity tolerance  Visit Diagnosis: Pain in thoracic spine  Abnormal posture  Cramp and spasm     Problem List Patient Active Problem List   Diagnosis Date Noted  . Anaphylaxis 03/02/2014  . S/P laparoscopic assisted vaginal hysterectomy (LAVH) 03/30/2013    Darrel Hoover  PT 08/10/2015, 8:11 AM  [facility] [address] [place], [place], [address] Phone: [phone]   Fax:  [phone]  Name: Mary [name] MRN: [id number] Date of Birth: March 22, 1955

## 2015-08-14 ENCOUNTER — Ambulatory Visit: Payer: PRIVATE HEALTH INSURANCE | Admitting: Physical Therapy

## 2015-08-14 ENCOUNTER — Encounter: Payer: Self-pay | Admitting: Physical Therapy

## 2015-08-14 DIAGNOSIS — R252 Cramp and spasm: Secondary | ICD-10-CM | POA: Diagnosis not present

## 2015-08-14 DIAGNOSIS — M546 Pain in thoracic spine: Secondary | ICD-10-CM

## 2015-08-14 DIAGNOSIS — R293 Abnormal posture: Secondary | ICD-10-CM | POA: Diagnosis not present

## 2015-08-14 NOTE — Therapy (Signed)
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 Anaphylaxis    Chicory root, shellfish  . Arthritis    hands, knees, neck - no meds  . Celiac disease   . Depression   . Fibromyalgia   . GERD (gastroesophageal reflux disease)    no meds  . PONV (postoperative nausea and vomiting)   . SVD (spontaneous vaginal delivery)    x 2    Past Surgical History:  Procedure Laterality Date  . BILATERAL SALPINGECTOMY Bilateral 03/30/2013   Procedure: BILATERAL SALPINGECTOMY;  Surgeon: Mary Lose, MD;  Location: [facility];  Service: Gynecology;  Laterality: Bilateral;  . dermoid cystect    . HYSTEROSCOPY W/D&C  01/28/2012   Procedure: DILATATION AND CURETTAGE /HYSTEROSCOPY;  Surgeon: Mary Lose, MD;  Location: [facility];  Service: Gynecology;  Laterality: N/A;  . LAPAROSCOPIC ASSISTED VAGINAL HYSTERECTOMY N/A 03/30/2013   Procedure: LAPAROSCOPIC ASSISTED VAGINAL HYSTERECTOMY;  Surgeon: Mary Lose, MD;  Location: [facility];  Service: Gynecology;  Laterality: N/A;  . REPLACEMENT TOTAL KNEE     bilateral  . SPINAL FUSION     C3-C4  . WISDOM TOOTH EXTRACTION      There were no vitals filed for this visit.      Subjective Assessment - 08/14/15 1632    Subjective "The tape seemed to help immediately, and after taking the tape off it felt sore"    Currently in Pain? Yes   Pain Score 3    Pain Orientation Right   Pain Type Chronic pain   Pain Onset 1 to 4 weeks ago   Pain Frequency Intermittent                         [facility] Adult PT Treatment/Exercise - 08/14/15 0001      Modalities   Modalities Moist Heat     Moist Heat Therapy   Number Minutes Moist Heat 10 Minutes   Moist Heat Location Other (comment)  thoracic spine in prone     Manual Therapy   Manual Therapy Joint mobilization;Soft tissue mobilization;Myofascial release   Joint Mobilization T1-T7 Grade 3 central mobs   Soft tissue mobilization IASTM along R thoracic paraspinals and medial scapular area on the R.    Myofascial Release Fascial rolling/ stretching over the R thoracic region          Trigger Point Dry Needling - 08/14/15 1647    Consent Given? Yes   Education Handout Provided Yes   Muscles Treated Upper Body Longissimus;Rhomboids   Rhomboids Response Palpable increased muscle length;Twitch response elicited   Longissimus Response Twitch response elicited;Palpable increased muscle length  R thoracic multifidus at T6 x  2 with pistoning and twisting               PT Education - 08/14/15 1720    Education provided Yes   Education Details DN benefits and what to expect as well as after care.    Person(s) Educated Patient   Methods Explanation;Verbal cues   Comprehension Verbalized understanding;Verbal cues required          PT Short Term Goals - 08/08/15 0737      PT SHORT TERM GOAL #1   Title She will be independent with intial HEP   Time 1   Period Weeks   Status New           PT Long Term Goals - 08/08/15 [id number]      PT LONG TERM GOAL #1   Title She will be independent with all HEP issued   Time 6   Period Weeks   Status New     PT LONG TERM GOAL #2   Title She will report pain  75% or more improved with  sitting and using mouse at work and home.    Time 6   Period Weeks   Status New     PT LONG TERM GOAL #3   Title She will report no symptoms down RT arm.    Time 6   Period Weeks   Status New     PT LONG TERM GOAL #4   Title She will hae no pain after swimming for exercise   Time 6   Period Weeks   Status New               Plan - 08/14/15 1714    Clinical Impression Statement [name] reports that the tape helped and that she feels like she is doing better. Performed DN on the R Rhomboid/ T6 R thoracic multifidus; pt was monitored throughout treatment. Following soft tissue work she reported decreased pain in the back. Opted not to tape today to assess response to DN in regard to referral to the elbow/ forearm which pt agreed.    PT Next Visit Plan assess response to DN, Tape PRN, a modalities for pain , dry needle, review HEP   Consulted and Agree with Plan of Care Patient      Patient will benefit from skilled therapeutic intervention in order to improve the following deficits and impairments:  Pain, Postural dysfunction, Decreased activity tolerance  Visit Diagnosis: Pain in thoracic spine  Abnormal posture  Cramp and spasm     Problem List Patient Active Problem List   Diagnosis Date Noted  . Anaphylaxis 03/02/2014  . S/P laparoscopic assisted vaginal hysterectomy (LAVH) 03/30/2013   Mary [name] PT, DPT, LAT, ATC  08/14/15  5:26 PM      [place] [facility] [address] [place], [place], [address] Phone: [phone]   Fax:  [phone]  Name: Mary [name] MRN: [id number] Date of Birth: [birth year]

## 2015-08-17 ENCOUNTER — Ambulatory Visit: Payer: PRIVATE HEALTH INSURANCE

## 2015-08-17 ENCOUNTER — Ambulatory Visit (HOSPITAL_BASED_OUTPATIENT_CLINIC_OR_DEPARTMENT_OTHER): Payer: 59 | Attending: Family Medicine | Admitting: Internal Medicine

## 2015-08-17 VITALS — Ht 63.0 in | Wt 245.0 lb

## 2015-08-17 DIAGNOSIS — G4733 Obstructive sleep apnea (adult) (pediatric): Secondary | ICD-10-CM | POA: Diagnosis not present

## 2015-08-17 DIAGNOSIS — R293 Abnormal posture: Secondary | ICD-10-CM

## 2015-08-17 DIAGNOSIS — G4736 Sleep related hypoventilation in conditions classified elsewhere: Secondary | ICD-10-CM | POA: Diagnosis not present

## 2015-08-17 DIAGNOSIS — R0683 Snoring: Secondary | ICD-10-CM | POA: Insufficient documentation

## 2015-08-17 DIAGNOSIS — M546 Pain in thoracic spine: Secondary | ICD-10-CM | POA: Diagnosis not present

## 2015-08-17 DIAGNOSIS — I493 Ventricular premature depolarization: Secondary | ICD-10-CM | POA: Diagnosis not present

## 2015-08-17 DIAGNOSIS — R252 Cramp and spasm: Secondary | ICD-10-CM | POA: Diagnosis not present

## 2015-08-17 DIAGNOSIS — R5383 Other fatigue: Secondary | ICD-10-CM | POA: Diagnosis not present

## 2015-08-17 DIAGNOSIS — G4719 Other hypersomnia: Secondary | ICD-10-CM

## 2015-08-17 NOTE — Therapy (Signed)
[place], [place], [address] Phone: [phone]   Fax:  [phone]  Physical Therapy Treatment  Patient Details  Name: Mary [name] MRN: [id number] Date of Birth: March 02, 1955 Referring Provider: Odis Luster, MD  Encounter Date: 08/17/2015      PT End of Session - 08/17/15 0703    Visit Number 4   Number of Visits 12   Date for PT Re-Evaluation 09/21/15   Authorization Type Worker compensation   Authorization - Visit Number 4   Authorization - Number of Visits 6   PT Start Time 0702   PT Stop Time 0745   PT Time Calculation (min) 43 min   Activity Tolerance Patient tolerated treatment well   Behavior During Therapy [facility] for tasks assessed/performed      Past Medical History:  Diagnosis Date  . Anaphylaxis    Chicory root, shellfish  . Arthritis    hands, knees, neck - no meds  . Celiac disease   . Depression   . Fibromyalgia   . GERD (gastroesophageal reflux disease)    no meds  . PONV (postoperative nausea and vomiting)   . SVD (spontaneous vaginal delivery)    x 2    Past Surgical History:  Procedure Laterality Date  . BILATERAL SALPINGECTOMY Bilateral 03/30/2013   Procedure: BILATERAL SALPINGECTOMY;  Surgeon: Thurnell Lose, MD;  Location: [facility];  Service: Gynecology;  Laterality: Bilateral;  . dermoid cystect    . HYSTEROSCOPY W/D&C  01/28/2012   Procedure: DILATATION AND CURETTAGE /HYSTEROSCOPY;  Surgeon: Thurnell Lose, MD;  Location: [facility];  Service: Gynecology;  Laterality: N/A;  . LAPAROSCOPIC ASSISTED VAGINAL HYSTERECTOMY N/A 03/30/2013   Procedure: LAPAROSCOPIC ASSISTED VAGINAL HYSTERECTOMY;  Surgeon: Thurnell Lose, MD;  Location: [facility];  Service: Gynecology;  Laterality: N/A;  . REPLACEMENT TOTAL KNEE     bilateral  . SPINAL FUSION     C3-C4  . WISDOM TOOTH EXTRACTION      There were no vitals filed for this visit.      Subjective Assessment - 08/17/15 0703    Subjective OK after session but was sore later in lower spine and shoulder butdoing better with periods of no pain but pain occurs withouf cause later   Currently in Pain? No/denies  pain earlier but not to start session                         [facility] Adult PT Treatment/Exercise - 08/17/15 0001      Shoulder Exercises: ROM/Strengthening   UBE (Upper Arm Bike) 5 min L1 forward     Ultrasound   Ultrasound Location RT upper back medial to scapula   Ultrasound Parameters 100% 1MHZ  1.6 Wcm2   Ultrasound Goals Pain     Manual Therapy   Joint Mobilization T1-T7 Grade 3 central mobs   Soft tissue mobilization IASTM along R thoracic paraspinals and medial scapular area on the R.      [employer]   Inhibit Muscle  rhohmboid stretch before applying tape                  PT Short Term Goals - 08/17/15 0757      PT SHORT TERM GOAL #1   Title She will be independent with intial HEP   Status Achieved           PT Long Term Goals - 08/08/15 0738      PT LONG TERM  GOAL #1   Title She will be independent with all HEP issued   Time 6   Period Weeks   Status New     PT LONG TERM GOAL #2   Title She will report pain  75% or more improved with sitting and using mouse at work and home.    Time 6   Period Weeks   Status New     PT LONG TERM GOAL #3   Title She will report no symptoms down RT arm.    Time 6   Period Weeks   Status New     PT LONG TERM GOAL #4   Title She will hae no pain after swimming for exercise   Time 6   Period Weeks   Status New               Plan - 08/17/15 0753    Clinical Impression Statement Ms Kerkstra reports improvement with less pain and less frequency with pain. She felt the DN was helpful. She has felt some lower thoracic muscle soreness and occasionally to head but overall pain is better. She has done all HEP. She has not worked for the last 2 days and this may have contributed Retaped as she felt she had no pain  with tape on.    PT Treatment/Interventions Cryotherapy;Electrical Stimulation;Iontophoresis 4mg /ml Dexamethasone;Moist Heat;Ultrasound;Therapeutic exercise;Patient/family education;Taping;Manual techniques;Dry needling;Passive range of motion   PT Next Visit Plan Continue DN , tpaing , modalities, stretching , manual   Consulted and Agree with Plan of Care Patient      Patient will benefit from skilled therapeutic intervention in order to improve the following deficits and impairments:  Pain, Postural dysfunction, Decreased activity tolerance  Visit Diagnosis: Pain in thoracic spine  Abnormal posture  Cramp and spasm     Problem List Patient Active Problem List   Diagnosis Date Noted  . Anaphylaxis 03/02/2014  . S/P laparoscopic assisted vaginal hysterectomy (LAVH) 03/30/2013    Darrel Hoover  PT 08/17/2015, 7:59 AM  [facility] [address] [place], [place], [address] Phone: [phone]   Fax:  [phone]  Name: Mary [name] MRN: [id number] Date of Birth: [birth year]

## 2015-08-21 ENCOUNTER — Ambulatory Visit: Payer: PRIVATE HEALTH INSURANCE | Attending: Family Medicine | Admitting: Physical Therapy

## 2015-08-21 ENCOUNTER — Encounter: Payer: Self-pay | Admitting: Physical Therapy

## 2015-08-21 ENCOUNTER — Ambulatory Visit
Admission: RE | Admit: 2015-08-21 | Discharge: 2015-08-21 | Disposition: A | Discharge status: Home / Self Care | Payer: 59 | Source: Ambulatory Visit | Attending: Family Medicine | Admitting: Family Medicine

## 2015-08-21 DIAGNOSIS — R252 Cramp and spasm: Secondary | ICD-10-CM | POA: Insufficient documentation

## 2015-08-21 DIAGNOSIS — M546 Pain in thoracic spine: Secondary | ICD-10-CM | POA: Insufficient documentation

## 2015-08-21 DIAGNOSIS — Z1231 Encounter for screening mammogram for malignant neoplasm of breast: Secondary | ICD-10-CM

## 2015-08-21 DIAGNOSIS — R293 Abnormal posture: Secondary | ICD-10-CM

## 2015-08-21 NOTE — Therapy (Signed)
[place], [place], [address] Phone: [phone]   Fax:  [phone]  Physical Therapy Treatment  Patient Details  Name: Mary [name] MRN: [id number] Date of Birth: [birth year] Referring Provider: Odis Luster, MD  Encounter Date: 08/21/2015      PT End of Session - 08/21/15 0808    Visit Number 5   Number of Visits 12   Date for PT Re-Evaluation 09/21/15   Authorization Type Worker compensation   PT Start Time 0800   PT Stop Time 0845   PT Time Calculation (min) 45 min      Past Medical History:  Diagnosis Date  . Anaphylaxis    Chicory root, shellfish  . Arthritis    hands, knees, neck - no meds  . Celiac disease   . Depression   . Fibromyalgia   . GERD (gastroesophageal reflux disease)    no meds  . PONV (postoperative nausea and vomiting)   . SVD (spontaneous vaginal delivery)    x 2    Past Surgical History:  Procedure Laterality Date  . BILATERAL SALPINGECTOMY Bilateral 03/30/2013   Procedure: BILATERAL SALPINGECTOMY;  Surgeon: Thurnell Lose, MD;  Location: [facility];  Service: Gynecology;  Laterality: Bilateral;  . dermoid cystect    . HYSTEROSCOPY W/D&C  01/28/2012   Procedure: DILATATION AND CURETTAGE /HYSTEROSCOPY;  Surgeon: Thurnell Lose, MD;  Location: [facility];  Service: Gynecology;  Laterality: N/A;  . LAPAROSCOPIC ASSISTED VAGINAL HYSTERECTOMY N/A 03/30/2013   Procedure: LAPAROSCOPIC ASSISTED VAGINAL HYSTERECTOMY;  Surgeon: Thurnell Lose, MD;  Location: [facility];  Service: Gynecology;  Laterality: N/A;  . REPLACEMENT TOTAL KNEE     bilateral  . SPINAL FUSION     C3-C4  . WISDOM TOOTH EXTRACTION      There were no vitals filed for this visit.      Subjective Assessment - 08/21/15 1121    Subjective I am doing good. Its getting better.    Currently in Pain? No/denies                         [facility] Adult PT Treatment/Exercise - 08/21/15 0001       Shoulder Exercises: Supine   Other Supine Exercises supine scap stab series yellow band x 10 each      Shoulder Exercises: Standing   Retraction 10 reps   Retraction Limitations increased burning in upper right back.      Shoulder Exercises: ROM/Strengthening   UBE (Upper Arm Bike) 4 min L1 forward, 1 minute back   Other ROM/Strengthening Exercises side lying book openings x 5 each   Other ROM/Strengthening Exercises Seated thoracic extension in chair 5 sec x 5      Ultrasound   Ultrasound Location Rt upper back    Ultrasound Parameters 100% 1.6 w/cm2 1 MHZ   Ultrasound Goals Pain                  PT Short Term Goals - 08/17/15 0757      PT SHORT TERM GOAL #1   Title She will be independent with intial HEP   Status Achieved           PT Long Term Goals - 08/21/15 6837      PT LONG TERM GOAL #1   Title She will be independent with all HEP issued   Time 6   Period Weeks   Status On-going     PT LONG  TERM GOAL #2   Title She will report pain  75% or more improved with sitting and using mouse at work and home.    Baseline 30% improved   Time 6   Period Weeks   Status On-going     PT LONG TERM GOAL #3   Title She will report no symptoms down RT arm.    Time 6   Period Weeks   Status Partially Met     PT LONG TERM GOAL #4   Title She will hae no pain after swimming for exercise   Time 6   Period Weeks   Status On-going               Plan - 08/21/15 5638    Clinical Impression Statement No longer wake in the morning with upper back pain. Pain 30% improved with using mouse and more intermittent. Radicular sx nearly resolved in Rt arm. She has pain when she initially starts swimming however no pain during or after swimming. Began supine scap stab exercises with yellow band and no increased pain.    PT Next Visit Plan Continue DN , tpaing , modalities, stretching , manual, add supine scap bands to HEP?   PT Home Exercise Plan band exercise       Patient will benefit from skilled therapeutic intervention in order to improve the following deficits and impairments:  Pain, Postural dysfunction, Decreased activity tolerance  Visit Diagnosis: Pain in thoracic spine  Abnormal posture  Cramp and spasm     Problem List Patient Active Problem List   Diagnosis Date Noted  . Anaphylaxis 03/02/2014  . S/P laparoscopic assisted vaginal hysterectomy (LAVH) 03/30/2013    Dorene Ar, PTA 08/21/2015, 11:24 AM  [facility] [address] [place], [place], [address] Phone: [phone]   Fax:  [phone]  Name: Mary [name] MRN: [id number] Date of Birth: [birth year]

## 2015-08-23 ENCOUNTER — Ambulatory Visit: Payer: PRIVATE HEALTH INSURANCE | Admitting: Physical Therapy

## 2015-08-23 DIAGNOSIS — R252 Cramp and spasm: Secondary | ICD-10-CM | POA: Diagnosis not present

## 2015-08-23 DIAGNOSIS — M546 Pain in thoracic spine: Secondary | ICD-10-CM

## 2015-08-23 DIAGNOSIS — R293 Abnormal posture: Secondary | ICD-10-CM

## 2015-08-23 NOTE — Patient Instructions (Addendum)

## 2015-08-23 NOTE — Therapy (Signed)
[place], [place], [address] Phone: [phone]   Fax:  [phone]  Physical Therapy Treatment  Patient Details  Name: Mary [name] MRN: [id number] Date of Birth: Aug 14, 1955 Referring Provider: Odis Luster, MD  Encounter Date: 08/23/2015      PT End of Session - 08/23/15 1726    Visit Number 6   Number of Visits 12   Date for PT Re-Evaluation 09/21/15   Authorization Type Worker compensation   PT Start Time 1630   PT Stop Time 1718   PT Time Calculation (min) 48 min   Activity Tolerance Patient tolerated treatment well   Behavior During Therapy [facility] for tasks assessed/performed      Past Medical History:  Diagnosis Date  . Anaphylaxis    Chicory root, shellfish  . Arthritis    hands, knees, neck - no meds  . Celiac disease   . Depression   . Fibromyalgia   . GERD (gastroesophageal reflux disease)    no meds  . PONV (postoperative nausea and vomiting)   . SVD (spontaneous vaginal delivery)    x 2    Past Surgical History:  Procedure Laterality Date  . BILATERAL SALPINGECTOMY Bilateral 03/30/2013   Procedure: BILATERAL SALPINGECTOMY;  Surgeon: Thurnell Lose, MD;  Location: [facility];  Service: Gynecology;  Laterality: Bilateral;  . dermoid cystect    . HYSTEROSCOPY W/D&C  01/28/2012   Procedure: DILATATION AND CURETTAGE /HYSTEROSCOPY;  Surgeon: Thurnell Lose, MD;  Location: [facility];  Service: Gynecology;  Laterality: N/A;  . LAPAROSCOPIC ASSISTED VAGINAL HYSTERECTOMY N/A 03/30/2013   Procedure: LAPAROSCOPIC ASSISTED VAGINAL HYSTERECTOMY;  Surgeon: Thurnell Lose, MD;  Location: [facility];  Service: Gynecology;  Laterality: N/A;  . REPLACEMENT TOTAL KNEE     bilateral  . SPINAL FUSION     C3-C4  . WISDOM TOOTH EXTRACTION      There were no vitals filed for this visit.      Subjective Assessment - 08/23/15 1639    Subjective "I am feeling much better, I did feel some soreness today but  its getting better"    Currently in Pain? Yes   Pain Score 1    Pain Location Thoracic   Pain Orientation Right   Pain Descriptors / Indicators Aching;Dull;Sore   Pain Type Chronic pain   Aggravating Factors  touching the sore area   Pain Relieving Factors MHP, sleeping                         [facility] Adult PT Treatment/Exercise - 08/23/15 0001      Shoulder Exercises: Stretch   Other Shoulder Stretches Rhomboid stretch 2 x 30 sec   verbal/ visual cues for form      Manual Therapy   Joint Mobilization T1-T7 Grade 3 central mobs   Soft tissue mobilization IASTM along R thoracic paraspinals and medial scapular area on the R.    Myofascial Release Fascial rolling/ stretching over the R thoracic region          Trigger Point Dry Needling - 08/23/15 1730    Consent Given? Yes   Education Handout Provided Yes   Rhomboids Response Twitch response elicited;Palpable increased muscle length  x 4 with pistoing technique major/ minor on R only              PT Education - 08/23/15 1725    Education provided Yes   Education Details posture education regarding posture and  lifting / carrying mechanics. updated HEP for stretching of the Rhomboids.    Person(s) Educated Patient   Methods Explanation;Demonstration;Verbal cues;Handout   Comprehension Verbalized understanding;Verbal cues required          PT Short Term Goals - 08/17/15 0757      PT SHORT TERM GOAL #1   Title She will be independent with intial HEP   Status Achieved           PT Long Term Goals - 08/21/15 7096      PT LONG TERM GOAL #1   Title She will be independent with all HEP issued   Time 6   Period Weeks   Status On-going     PT LONG TERM GOAL #2   Title She will report pain  75% or more improved with sitting and using mouse at work and home.    Baseline 30% improved   Time 6   Period Weeks   Status On-going     PT LONG TERM GOAL #3   Title She will report no symptoms down  RT arm.    Time 6   Period Weeks   Status Partially Met     PT LONG TERM GOAL #4   Title She will hae no pain after swimming for exercise   Time 6   Period Weeks   Status On-going               Plan - 08/23/15 1726    Clinical Impression Statement [name] reports only minor soreness inthe mid R thoracic region. DN was performed on R Rhomboids; pt monitored throughout treatment. Following soft tissue work utilised session to [employer] about posture and update HEP for stretching of Rhomboids. pt declined modalities post session.    PT Next Visit Plan Continue DN , tpaing , modalities, stretching , manual, add supine scap bands to HEP?   Consulted and Agree with Plan of Care Patient      Patient will benefit from skilled therapeutic intervention in order to improve the following deficits and impairments:  Pain, Postural dysfunction, Decreased activity tolerance  Visit Diagnosis: Pain in thoracic spine  Abnormal posture  Cramp and spasm     Problem List Patient Active Problem List   Diagnosis Date Noted  . Anaphylaxis 03/02/2014  . S/P laparoscopic assisted vaginal hysterectomy (LAVH) 03/30/2013   [name] PT, DPT, LAT, ATC  08/23/15  5:31 PM      [facility] [address] [place], [place], [address] Phone: [phone]   Fax:  [phone]  Name: Mary [name] MRN: [id number] Date of Birth: [birth year]

## 2015-08-24 ENCOUNTER — Encounter: Payer: Self-pay | Admitting: Physical Therapy

## 2015-08-26 DIAGNOSIS — R0683 Snoring: Secondary | ICD-10-CM | POA: Diagnosis not present

## 2015-08-26 NOTE — Procedures (Signed)
  Patient Name: Mary [name], Circelli Date: 08/17/2015 Gender: Female D.O.B: Mar 16, 1955 Age (years): 60 Referring Provider: Harlan Stains Height (inches): 63 Interpreting Physician: Baird Lyons MD, ABSM Weight (lbs): 245 RPSGT: [place]er, Holly BMI: 43 MRN: Neck Size: 15.00 CLINICAL INFORMATION Sleep Study Type: NPSG Indication for sleep study: Fatigue, Snoring Epworth Sleepiness Score: 10  SLEEP STUDY TECHNIQUE As per the AASM Manual for the Scoring of Sleep and Associated Events v2.3 (April 2016) with a hypopnea requiring 4% desaturations. The channels recorded and monitored were frontal, central and occipital EEG, electrooculogram (EOG), submentalis EMG (chin), nasal and oral airflow, thoracic and abdominal wall motion, anterior tibialis EMG, snore microphone, electrocardiogram, and pulse oximetry.  MEDICATIONS Patient's medications include: charted for review. Medications self-administered by patient during sleep study : No sleep medicine administered.  SLEEP ARCHITECTURE The study was initiated at 10:31:27 PM and ended at 4:46:10 AM. Sleep onset time was 12.8 minutes and the sleep efficiency was 76.7%. The total sleep time was 287.4 minutes. Stage REM latency was N/A minutes. The patient spent 12.35% of the night in stage N1 sleep, 87.65% in stage N2 sleep, 0.00% in stage N3 and 0.00% in REM. Alpha intrusion was absent. Supine sleep was 74.29%.  RESPIRATORY PARAMETERS The overall apnea/hypopnea index (AHI) was 6.1 per hour. There were 1 total apneas, including 1 obstructive, 0 central and 0 mixed apneas. There were 28 hypopneas and 6 RERAs. The AHI during Stage REM sleep was N/A per hour.  REM absent AHI while supine was 7.9 per hour. The mean oxygen saturation was 88.98%. The minimum SpO2 during sleep was 0.00%. Moderate snoring was noted during this study.  CARDIAC DATA The 2 lead EKG demonstrated sinus rhythm. The mean heart rate was 77.33 beats per minute. Other  EKG findings include: PVCs.  LEG MOVEMENT DATA The total PLMS were 0 with a resulting PLMS index of 0.00. Associated arousal with leg movement index was 0.0 .  IMPRESSIONS - Mild obstructive sleep apnea occurred during this study (AHI = 6.1/h). - No significant central sleep apnea occurred during this study (CAI = 0.0/h). - Oxygen desaturation was noted during this study (Mean saturation was only 89%). - The patient snored with Moderate snoring volume. - EKG findings include PVCs. - Clinically significant periodic limb movements did not occur during sleep. No significant associated arousals.  DIAGNOSIS - Obstructive Sleep Apnea (327.23 [G47.33 ICD-10]) - Nocturnal Hypoxemia (327.26 [G47.36 ICD-10])  RECOMMENDATIONS - Positional therapy avoiding supine position during sleep. - Very mild obstructive sleep apnea. Return to discuss treatment options. - Avoid alcohol, sedatives and other CNS depressants that may worsen sleep apnea and disrupt normal sleep architecture. - Suggest recheck overnight oximetry on room air to verify mean saturation. Persistent low score would imply hypoventilation or cardiopulmonary disease. - Sleep hygiene should be reviewed to assess factors that may improve sleep quality. - Weight management and regular exercise should be initiated or continued if appropriate.  [Electronically signed] 08/26/2015 01:35 PM  Baird Lyons MD, [place], American Board of Sleep Medicine   NPI: [id number]  [place], American Board of Sleep Medicine  ELECTRONICALLY SIGNED ON:  08/26/2015, 1:26 PM [place] PH: [phone]   FX: [phone] [place]

## 2015-08-28 ENCOUNTER — Ambulatory Visit: Payer: PRIVATE HEALTH INSURANCE | Admitting: Physical Therapy

## 2015-08-28 DIAGNOSIS — R252 Cramp and spasm: Secondary | ICD-10-CM

## 2015-08-28 DIAGNOSIS — M546 Pain in thoracic spine: Secondary | ICD-10-CM

## 2015-08-28 DIAGNOSIS — R293 Abnormal posture: Secondary | ICD-10-CM

## 2015-08-28 NOTE — Therapy (Addendum)
[place], [place], [address] Phone: [phone]   Fax:  [phone]  Physical Therapy Treatment/Discharge  Patient Details  Name: Mary [name] MRN: [id number] Date of Birth: [birth year] Referring Provider: Odis Luster, MD  Encounter Date: 08/28/2015      PT End of Session - 08/28/15 1701    PT Start Time 1630   PT Stop Time 1647   PT Time Calculation (min) 17 min      Past Medical History:  Diagnosis Date  . Anaphylaxis    Chicory root, shellfish  . Arthritis    hands, knees, neck - no meds  . Celiac disease   . Depression   . Fibromyalgia   . GERD (gastroesophageal reflux disease)    no meds  . PONV (postoperative nausea and vomiting)   . SVD (spontaneous vaginal delivery)    x 2    Past Surgical History:  Procedure Laterality Date  . BILATERAL SALPINGECTOMY Bilateral 03/30/2013   Procedure: BILATERAL SALPINGECTOMY;  Surgeon: Thurnell Lose, MD;  Location: [facility];  Service: Gynecology;  Laterality: Bilateral;  . dermoid cystect    . HYSTEROSCOPY W/D&C  01/28/2012   Procedure: DILATATION AND CURETTAGE /HYSTEROSCOPY;  Surgeon: Thurnell Lose, MD;  Location: [facility];  Service: Gynecology;  Laterality: N/A;  . LAPAROSCOPIC ASSISTED VAGINAL HYSTERECTOMY N/A 03/30/2013   Procedure: LAPAROSCOPIC ASSISTED VAGINAL HYSTERECTOMY;  Surgeon: Thurnell Lose, MD;  Location: [facility];  Service: Gynecology;  Laterality: N/A;  . REPLACEMENT TOTAL KNEE     bilateral  . SPINAL FUSION     C3-C4  . WISDOM TOOTH EXTRACTION      There were no vitals filed for this visit.      Subjective Assessment - 08/28/15 1659    Subjective Walked into boss's back and wrenched her back again.  It is better today.  No longer gettin any arm pain, stopped noticing it a week ago, no longer waking her up at night.    Pertinent History cervical C3-4 fusion, RT and LT knee replacements, OA   Limitations Sitting  reaching , using  mouse   How long can you sit comfortably? 45 min   How long can you stand comfortably? as needed   How long can you walk comfortably? not limited by thoracic pain   Diagnostic tests none   Patient Stated Goals To releive pain.    Pain Score 5    Pain Location Back   Pain Orientation Right;Mid;Lateral   Pain Descriptors / Indicators Aching   Pain Radiating Towards no   Pain Onset 1 to 4 weeks ago   Pain Frequency Intermittent   Aggravating Factors  Bumping into a desk   Pain Relieving Factors DN, massage   Multiple Pain Sites No                                   PT Short Term Goals - 08/28/15 1708      PT SHORT TERM GOAL #1   Title She will be independent with intial HEP   Time 1   Period Weeks   Status Achieved           PT Long Term Goals - 08/28/15 1708      PT LONG TERM GOAL #1   Title She will be independent with all HEP issued   Baseline independent   Time 6   Status Achieved  PT LONG TERM GOAL #2   Title She will report pain  75% or more improved with sitting and using mouse at work and home.    Baseline 75% IMPROVED   Time 6   Period Weeks   Status Achieved     PT LONG TERM GOAL #3   Title She will report no symptoms down RT arm.    Baseline No symptoms into right arm X 1 week   Time 6   Period Weeks   Status Achieved     PT LONG TERM GOAL #4   Title She will hae no pain after swimming for exercise   Baseline No pain after swmming   Time 6   Period Weeks   Status Achieved               Plan - 08/28/15 1702    Clinical Impression Statement MD visit was today.  The plan was to use the 6 visits already approved then Discharge to her home exercise for continued improvement. Patient had used all the visits approved.  She really did not want to get billed so she opted for discharge today. There will be no charge for this visit.  FOTO score 49% limitation.  This was an intake FOTO that had never been completed.  All  goals were checked and all goals were met.     PT Next Visit Plan Discharge   PT Home Exercise Plan continue   Consulted and Agree with Plan of Care Patient      Patient will benefit from skilled therapeutic intervention in order to improve the following deficits and impairments:     Visit Diagnosis: Pain in thoracic spine  Abnormal posture  Cramp and spasm     Problem List Patient Active Problem List   Diagnosis Date Noted  . Anaphylaxis 03/02/2014  . S/P laparoscopic assisted vaginal hysterectomy (LAVH) 03/30/2013    Mary [name] 08/28/2015, 5:10 PM  [facility] [address] [place], [place], [address] Phone: [phone]   Fax:  [phone]  Name: Mary [name] MRN: [id number] Date of Birth: Apr 15, 1955   Melvenia Needles, PTA 08/28/15 5:10 PM Phone: [phone] Fax: [phone] PHYSICAL THERAPY DISCHARGE SUMMARY  Visits from Start of Care: 6  Current functional level related to goals / functional outcomes: See above   Remaining deficits: Improved but continues with pain    Education / Equipment: HEP Plan: Patient agrees to discharge.  Patient goals were met. Patient is being discharged due to                                                   PT not extended by Worker Compensation  ?????  Mary [name]  PT 08/29/15   9:14 AM

## 2015-08-30 ENCOUNTER — Encounter: Payer: Self-pay | Admitting: Physical Therapy

## 2015-09-05 DIAGNOSIS — R0902 Hypoxemia: Secondary | ICD-10-CM | POA: Diagnosis not present

## 2015-09-12 DIAGNOSIS — G4734 Idiopathic sleep related nonobstructive alveolar hypoventilation: Secondary | ICD-10-CM | POA: Diagnosis not present

## 2015-09-12 DIAGNOSIS — R0902 Hypoxemia: Secondary | ICD-10-CM | POA: Diagnosis not present

## 2015-09-12 DIAGNOSIS — G4733 Obstructive sleep apnea (adult) (pediatric): Secondary | ICD-10-CM | POA: Diagnosis not present

## 2015-09-17 DIAGNOSIS — R079 Chest pain, unspecified: Secondary | ICD-10-CM | POA: Diagnosis not present

## 2015-09-18 MED FILL — NITROGLYCERIN 0.4 MG TAB SL: 0.4 | 7 days supply | Qty: 25 | Fill #0

## 2015-09-18 MED FILL — DULoxetine HCL 60 MG CPEP: 60 | 90 days supply | Qty: 90 | Fill #1

## 2015-09-20 ENCOUNTER — Telehealth: Payer: Self-pay | Admitting: Cardiovascular Disease

## 2015-09-20 NOTE — Telephone Encounter (Signed)
Received records from [place] for appointment with Dr Oval Linsey on 10/19/15.  Records given to [facility] (medical records) for Dr Blenda Mounts schedule on 10/19/15. lp

## 2015-10-13 DIAGNOSIS — G4733 Obstructive sleep apnea (adult) (pediatric): Secondary | ICD-10-CM | POA: Diagnosis not present

## 2015-10-13 DIAGNOSIS — G4734 Idiopathic sleep related nonobstructive alveolar hypoventilation: Secondary | ICD-10-CM | POA: Diagnosis not present

## 2015-10-13 DIAGNOSIS — R0902 Hypoxemia: Secondary | ICD-10-CM | POA: Diagnosis not present

## 2015-10-16 DIAGNOSIS — G4734 Idiopathic sleep related nonobstructive alveolar hypoventilation: Secondary | ICD-10-CM | POA: Diagnosis not present

## 2015-10-16 DIAGNOSIS — Z23 Encounter for immunization: Secondary | ICD-10-CM | POA: Diagnosis not present

## 2015-10-16 DIAGNOSIS — R7301 Impaired fasting glucose: Secondary | ICD-10-CM | POA: Diagnosis not present

## 2015-10-16 DIAGNOSIS — E785 Hyperlipidemia, unspecified: Secondary | ICD-10-CM | POA: Diagnosis not present

## 2015-10-16 DIAGNOSIS — M797 Fibromyalgia: Secondary | ICD-10-CM | POA: Diagnosis not present

## 2015-10-16 DIAGNOSIS — E559 Vitamin D deficiency, unspecified: Secondary | ICD-10-CM | POA: Diagnosis not present

## 2015-10-16 DIAGNOSIS — Z0000 Encounter for general adult medical examination without abnormal findings: Secondary | ICD-10-CM | POA: Diagnosis not present

## 2015-10-16 DIAGNOSIS — F322 Major depressive disorder, single episode, severe without psychotic features: Secondary | ICD-10-CM | POA: Diagnosis not present

## 2015-10-18 DIAGNOSIS — L981 Factitial dermatitis: Secondary | ICD-10-CM | POA: Diagnosis not present

## 2015-10-18 DIAGNOSIS — L820 Inflamed seborrheic keratosis: Secondary | ICD-10-CM | POA: Diagnosis not present

## 2015-10-18 MED FILL — MOMETASONE FUROATE 0.1% CRM: 0.1 | 30 days supply | Qty: 45 | Fill #0

## 2015-10-19 ENCOUNTER — Encounter: Payer: Self-pay | Admitting: Cardiovascular Disease

## 2015-10-19 ENCOUNTER — Ambulatory Visit (INDEPENDENT_AMBULATORY_CARE_PROVIDER_SITE_OTHER): Payer: 59 | Admitting: Cardiovascular Disease

## 2015-10-19 ENCOUNTER — Ambulatory Visit: Payer: Self-pay | Admitting: Cardiovascular Disease

## 2015-10-19 VITALS — BP 124/76 | HR 79 | Ht 62.5 in | Wt 246.0 lb

## 2015-10-19 DIAGNOSIS — E785 Hyperlipidemia, unspecified: Secondary | ICD-10-CM | POA: Diagnosis not present

## 2015-10-19 DIAGNOSIS — R079 Chest pain, unspecified: Secondary | ICD-10-CM

## 2015-10-19 DIAGNOSIS — R011 Cardiac murmur, unspecified: Secondary | ICD-10-CM

## 2015-10-19 HISTORY — DX: Morbid (severe) obesity due to excess calories: E66.01

## 2015-10-19 HISTORY — DX: Cardiac murmur, unspecified: R01.1

## 2015-10-19 NOTE — Progress Notes (Signed)
Cardiology Office Note   Date:  10/19/2015   ID:  Mary [name], Mary [name] 16-Feb-1955, MRN [id number]  PCP:  Vidal Schwalbe, MD  Cardiologist:   Skeet Latch, MD   Chief Complaint  Patient presents with  . New Evaluation    pt c/o fluttering in chest and jaw pain, pt denied SOB and swelling in legs and feet      History of Present Illness: Mary [name] is a 60 y.o. female with hyperlipidemia and fibromyalgia who presents for an evaluation of jaw pain.  Last month Mary [name] had two episodes of jaw pain.  The first occurred while she was at work.  It Lasted for approximately 10 minutes. There is no chest pain, shortness of breath, nausea, or diaphoresis and time. A few days later she reported second episode that occurred while laying in the bed. This is associated with the sensation of fluttering in her chest. It lasted for approximately 10-15 minutes. She is also noted intermittent episodes of 2 out of 10 central chest pressure. This discomfort does not radiate and is not associated with shortness of breath. She noticed it one time while walking on a cruise ship, but has not had any subsequent episodes while exercising. She walks 30 minutes twice per week and swims 2-3 times per week. She has not noted any exertional dyspnea. She occasionally notes lower extremity edema at the end of the day if she has been walking a lot, but it improves with elevation of her legs. Of note, Mary [name] recently started using supplemental oxygen at night based on overnight desaturations. She does not use of CPAP.  Mary [name] was evaluated for this jaw discomfort by her PCP, Dr. Maury [name], on 09/17/15.  At that appointment she had an EKG that revealed sinus rhythm with nonspecific T wave abnormalities.  She was referred to cardiology for further evaluation and stress testing.    Mary [name] was informed that her hemoglobin A1c is elevated.  She's been trying to make dietary changes to lower these levels. She notes  that her cholesterol has been elevated in the past, but when recently checked it was slightly improved from before. She is working on limiting sugar intake and reducing her portion sizes.   Past Medical History:  Diagnosis Date  . Anaphylaxis    Chicory root, shellfish  . Arthritis    hands, knees, neck - no meds  . Celiac disease   . Depression   . Fibromyalgia   . GERD (gastroesophageal reflux disease)    no meds  . Morbid obesity ([place]) 10/19/2015  . PONV (postoperative nausea and vomiting)   . SVD (spontaneous vaginal delivery)    x 2  . Systolic murmur [id number]    Past Surgical History:  Procedure Laterality Date  . BILATERAL SALPINGECTOMY Bilateral 03/30/2013   Procedure: BILATERAL SALPINGECTOMY;  Surgeon: Thurnell Lose, MD;  Location: [facility];  Service: Gynecology;  Laterality: Bilateral;  . dermoid cystect    . HYSTEROSCOPY W/D&C  01/28/2012   Procedure: DILATATION AND CURETTAGE /HYSTEROSCOPY;  Surgeon: Thurnell Lose, MD;  Location: [facility];  Service: Gynecology;  Laterality: N/A;  . LAPAROSCOPIC ASSISTED VAGINAL HYSTERECTOMY N/A 03/30/2013   Procedure: LAPAROSCOPIC ASSISTED VAGINAL HYSTERECTOMY;  Surgeon: Thurnell Lose, MD;  Location: [facility];  Service: Gynecology;  Laterality: N/A;  . REPLACEMENT TOTAL KNEE     bilateral  . SPINAL FUSION     C3-C4  . WISDOM TOOTH EXTRACTION  Current Outpatient Prescriptions  Medication Sig Dispense Refill  . acetaminophen (TYLENOL) 500 MG tablet Take 1,000 mg by mouth daily as needed for headache.    . diclofenac sodium (VOLTAREN) 1 % GEL Apply 2 g topically daily as needed (for pain).    Marland Kitchen diphenhydrAMINE (BENADRYL) 25 MG tablet Take 1 tablet (25 mg total) by mouth every 6 (six) hours. 40 tablet 0  . DULoxetine (CYMBALTA) 60 MG capsule Take 60 mg by mouth at bedtime.     Marland Kitchen EPINEPHrine 0.3 mg/0.3 mL IJ SOAJ injection Inject into the muscle.    . loratadine (CLARITIN) 10 MG tablet Take 10 mg by mouth daily as needed for  allergies.    . Multiple Vitamin (MULTIVITAMIN WITH MINERALS) TABS tablet Take 1 tablet by mouth daily.    . Omega-3 Fatty Acids (FISH OIL PO) Take 1 tablet by mouth daily.    . ranitidine (ZANTAC) 150 MG tablet Take 1 tablet (150 mg total) by mouth 2 (two) times daily. 10 tablet 0   No current facility-administered medications for this visit.     Allergies:   Aspirin; Chicory root [cichorium intybus]; Nsaids; Other; Shellfish allergy; and Celebrex [celecoxib]    Social History:  The patient  reports that she has never smoked. She has never used smokeless tobacco. She reports that she drinks alcohol. She reports that she does not use drugs.   Family History:  The patient's family history includes Atrial fibrillation in her mother; Bradycardia in her mother; Breast cancer in her maternal grandmother; Leukemia in her father.    ROS:  Please see the history of present illness.   Otherwise, review of systems are positive for none.   All other systems are reviewed and negative.    PHYSICAL EXAM: VS:  BP 124/76   Pulse 79   Ht 5' 2.5" (1.588 m)   Wt 246 lb (111.6 kg)   BMI 44.28 kg/m  , BMI Body mass index is 44.28 kg/m. GENERAL:  Well appearing HEENT:  Pupils equal round and reactive, fundi not visualized, oral mucosa unremarkable NECK:  No jugular venous distention, waveform within normal limits, carotid upstroke brisk and symmetric, no bruits, no thyromegaly LYMPHATICS:  No cervical adenopathy LUNGS:  Clear to auscultation bilaterally HEART:  RRR.  PMI not displaced or sustained,S1 and S2 within normal limits, no S3, no S4, no clicks, no rubs, I/VI systolic murmur ABD:  Flat, positive bowel sounds normal in frequency in pitch, no bruits, no rebound, no guarding, no midline pulsatile mass, no hepatomegaly, no splenomegaly EXT:  2 plus pulses throughout, no edema, no cyanosis no clubbing SKIN:  No rashes no nodules NEURO:  Cranial nerves II through XII grossly intact, motor grossly  intact throughout PSYCH:  Cognitively intact, oriented to person place and time   EKG:  EKG is ordered today. The ekg ordered today demonstrates sinus rhythm. Rate 79 bpm. T-wave in the [place]. 09/17/15: Sinus rhythm. Rate 79 bpm. Nonspecific T wave abnormalities.   Recent Labs: No results found for requested labs within last 8760 hours.   10/19/14: Creatinine 0.62 12/2012: Total cholesterol 209, triglycerides 77, HDL 51, LDL 143  Lipid Panel    Component Value Date/Time   CHOL 201 (H) 07/16/2010 0410   TRIG 167 (H) 07/16/2010 0410   HDL 44 07/16/2010 0410   CHOLHDL 4.6 07/16/2010 0410   VLDL 33 07/16/2010 0410   LDLCALC 124 (H) 07/16/2010 0410      Wt Readings from Last 3 Encounters:  10/19/15  246 lb (111.6 kg)  08/18/15 245 lb (111.1 kg)  03/03/14 248 lb 0.3 oz (112.5 kg)      ASSESSMENT AND PLAN:  # Atypical chest/jaw pain: Mary [name] has atypical symptoms that have not occurred with exertion. However, she does have risk factors including obesity, and poorly controlled hyperlipidemia. Therefore, we will obtain an ETT to evaluate for ischemia.  She is not on aspirin due to history of anaphylaxis.  She also reported palpitations. However, they only occur in the setting of her chest discomfort and jaw pain. We will start with stress testing and only consider ambulatory monitoring if it persists and nothing is seen with her stress test.  # Hyperlipidemia: Mary [name]'s lipids are elevated but we do not have a copy of her most recent report. Therefore, we will obtain this and and calculate her ASCVD 10 year risk.  Continue fish oil.  # Murmur: Mary [name] was noted to have a slight systolic murmur on exam. Is is likely a flow murmur, however, given her symptoms we will obtain an echo to further evaluate.  # Morbid obesity: Mary [name] was encouraged to keep up her dietary and exercise changes.    Current medicines are reviewed at length with the patient today.  The patient does  not have concerns regarding medicines.  The following changes have been made:  no change  Labs/ tests ordered today include:   Orders Placed This Encounter  Procedures  . Exercise Tolerance Test  . EKG 12-Lead  . ECHOCARDIOGRAM COMPLETE     Disposition:   FU with Jaquise Faux C. Oval Linsey, MD, [facility] in 1 month    This note was written with the assistance of speech recognition software.  Please excuse any transcriptional errors.  Signed, Diyari Cherne C. Oval Linsey, MD, [facility]  10/19/2015 8:53 AM    [place]

## 2015-10-19 NOTE — Patient Instructions (Addendum)
Medication Instructions:  Your physician recommends that you continue on your current medications as directed. Please refer to the Current Medication list given to you today.  Labwork: none  Testing/Procedures: Your physician has requested that you have an exercise tolerance test. For further information please visit HugeFiesta.tn. Please also follow instruction sheet, as given.  Your physician has requested that you have an echocardiogram. Echocardiography is a painless test that uses sound waves to create images of your heart. It provides your doctor with information about the size and shape of your heart and how well your heart's chambers and valves are working. This procedure takes approximately one hour. There are no restrictions for this procedure. [place] [address]  Follow-Up: Your physician recommends that you schedule a follow-up appointment in: [place]  If you need a refill on your cardiac medications before your next appointment, please call your pharmacy.

## 2015-11-06 ENCOUNTER — Ambulatory Visit (INDEPENDENT_AMBULATORY_CARE_PROVIDER_SITE_OTHER): Payer: 59

## 2015-11-06 ENCOUNTER — Other Ambulatory Visit: Payer: Self-pay

## 2015-11-06 ENCOUNTER — Ambulatory Visit (HOSPITAL_COMMUNITY): Payer: 59 | Attending: Cardiology

## 2015-11-06 DIAGNOSIS — Z6841 Body Mass Index (BMI) 40.0 and over, adult: Secondary | ICD-10-CM | POA: Diagnosis not present

## 2015-11-06 DIAGNOSIS — M797 Fibromyalgia: Secondary | ICD-10-CM | POA: Insufficient documentation

## 2015-11-06 DIAGNOSIS — R079 Chest pain, unspecified: Secondary | ICD-10-CM | POA: Insufficient documentation

## 2015-11-06 DIAGNOSIS — R011 Cardiac murmur, unspecified: Secondary | ICD-10-CM | POA: Insufficient documentation

## 2015-11-06 DIAGNOSIS — E785 Hyperlipidemia, unspecified: Secondary | ICD-10-CM | POA: Insufficient documentation

## 2015-11-06 DIAGNOSIS — I519 Heart disease, unspecified: Secondary | ICD-10-CM | POA: Insufficient documentation

## 2015-11-06 LAB — EXERCISE TOLERANCE TEST
CHL CUP MPHR: 158 {beats}/min
Estimated workload: 6.2 METS
Exercise duration (min): 4 min
Exercise duration (sec): 23 s
Peak HR: 162 {beats}/min
Percent HR: 102 %
RPE: 15
Rest HR: 73 {beats}/min

## 2015-11-12 DIAGNOSIS — G4734 Idiopathic sleep related nonobstructive alveolar hypoventilation: Secondary | ICD-10-CM | POA: Diagnosis not present

## 2015-11-12 DIAGNOSIS — R0902 Hypoxemia: Secondary | ICD-10-CM | POA: Diagnosis not present

## 2015-11-12 DIAGNOSIS — G4733 Obstructive sleep apnea (adult) (pediatric): Secondary | ICD-10-CM | POA: Diagnosis not present

## 2015-11-13 ENCOUNTER — Other Ambulatory Visit: Payer: Self-pay | Admitting: *Deleted

## 2015-11-13 ENCOUNTER — Telehealth: Payer: Self-pay | Admitting: Cardiovascular Disease

## 2015-11-13 DIAGNOSIS — S0501XA Injury of conjunctiva and corneal abrasion without foreign body, right eye, initial encounter: Secondary | ICD-10-CM | POA: Diagnosis not present

## 2015-11-13 DIAGNOSIS — R0789 Other chest pain: Secondary | ICD-10-CM

## 2015-11-13 MED FILL — NEO/POLYMYXIN/DEXAMETH DROP: 3.5-10000-0 | 7 days supply | Qty: 5 | Fill #0

## 2015-11-13 NOTE — Telephone Encounter (Signed)
Returning your call. °

## 2015-11-13 NOTE — Telephone Encounter (Signed)
-----   Message from Skeet Latch, MD sent at 11/07/2015  2:47 PM EDT ----- Echo shows that her heart squeezes well but does not relax completely.  This is a mild change and will not cause symptoms unless it worsens.  It will be important to keep her blood pressure under good control.  There were no problems with her heart valves to explain the murmur.

## 2015-11-13 NOTE — Telephone Encounter (Signed)
-----   Message from Skeet Latch, MD sent at 11/07/2015  3:10 PM EDT ----- Stress test wasn't diagnostic.  Recommend Lexiscan Myoview.

## 2015-11-13 NOTE — Telephone Encounter (Signed)
Advised patient of results Message sent to scheduling department to get [facility] scheduled

## 2015-11-16 ENCOUNTER — Ambulatory Visit: Payer: 59 | Admitting: Cardiovascular Disease

## 2015-11-20 ENCOUNTER — Encounter: Payer: Self-pay | Admitting: Pulmonary Disease

## 2015-11-20 ENCOUNTER — Ambulatory Visit (INDEPENDENT_AMBULATORY_CARE_PROVIDER_SITE_OTHER): Payer: 59 | Admitting: Pulmonary Disease

## 2015-11-20 VITALS — BP 110/70 | HR 82 | Ht 62.5 in | Wt 235.0 lb

## 2015-11-20 DIAGNOSIS — E662 Morbid (severe) obesity with alveolar hypoventilation: Secondary | ICD-10-CM

## 2015-11-20 DIAGNOSIS — G4734 Idiopathic sleep related nonobstructive alveolar hypoventilation: Secondary | ICD-10-CM

## 2015-11-20 DIAGNOSIS — Z6841 Body Mass Index (BMI) 40.0 and over, adult: Secondary | ICD-10-CM

## 2015-11-20 NOTE — Progress Notes (Signed)
Past Surgical History She  has a past surgical history that includes Spinal fusion; dermoid cystect; Replacement total knee; Hysteroscopy w/D&C (01/28/2012); Wisdom tooth extraction; Laparoscopic assisted vaginal hysterectomy (N/A, 03/30/2013); and Bilateral salpingectomy (Bilateral, 03/30/2013).  Allergies  Allergen Reactions  . Aspirin Anaphylaxis and Swelling  . 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 Scratch throat. cough  . Nsaids Anaphylaxis and Swelling  . Other Anaphylaxis, Itching and Cough    Pt ate a fruit bar that may have had something that is causing this allergic reaction.     . Shellfish Allergy Anaphylaxis and Swelling  . Celebrex [Celecoxib] Swelling    Facial swelling    Family History Her family history includes Atrial fibrillation in her mother; Bradycardia in her mother; Breast cancer in her maternal grandmother; Leukemia in her father.  Social History She  reports that she has never smoked. She has never used smokeless tobacco. She reports that she drinks alcohol. She reports that she does not use drugs.  Review of systems Chest pain, weight change, joint pain.  All else negative.  Current Outpatient Prescriptions on File Prior to Visit  Medication Sig  . acetaminophen (TYLENOL) 500 MG tablet Take 1,000 mg by mouth daily as needed for headache.  . diclofenac sodium (VOLTAREN) 1 % GEL Apply 2 g topically daily as needed (for pain).  . DULoxetine (CYMBALTA) 60 MG capsule Take 60 mg by mouth at bedtime.   Marland Kitchen EPINEPHrine 0.3 mg/0.3 mL IJ SOAJ injection Inject into the muscle.  . loratadine (CLARITIN) 10 MG tablet Take 10 mg by mouth daily as needed for allergies.  . Multiple Vitamin (MULTIVITAMIN WITH MINERALS) TABS tablet Take 1 tablet by mouth daily.  . Omega-3 Fatty Acids (FISH OIL PO) Take 1 tablet by mouth daily.   No current facility-administered medications on file prior to visit.     Chief  Complaint  Patient presents with  . Sleep Consult    referred by Dr Harlan Stains for nocturnal hypoxemia x3 months.  ONO was positive and she wears 2lpm QHS    Sleep tests PSG 08/17/15 >> AHI 6.1, SpO2 low 89%  Cardiac tests Echo 11/06/15 >> EF 55 to 60%, grade 2 DD  Past medical history She  has a past medical history of Anaphylaxis; Arthritis; Celiac disease; Depression; Fibromyalgia; GERD (gastroesophageal reflux disease); Morbid obesity ([place]) (10/19/2015); PONV (postoperative nausea and vomiting); SVD (spontaneous vaginal delivery); and Systolic murmur (Q000111Q).  Vital signs BP 110/70 (BP Location: Right Arm, Cuff Size: Normal)   Pulse 82   Ht 5' 2.5" (1.588 m)   Wt 235 lb (106.6 kg)   SpO2 100%   BMI 42.30 kg/m   History of Present Illness Mary [name] is a 60 y.o. female for evaluation of sleep problems.  She has noticed trouble with her sleep for sometime.  She snores and would wake up feeling like she couldn't breath.  She used to get night terrors and wake up feeling confused.  She has sleep study which showed very mild OSA, but did have low oxygen.  She then had overnight oximetry and was subsequently started on 2 liters oxygen at night.  Her sleep and energy level have improved with supplemental oxygen.  She also changed her diet and has lost about 8 lbs.  She used to grind her teeth also, and talk in her sleep.  She goes to sleep at 10 pm.  She falls asleep after  5 minutes.  She wakes up sometimes to use the bathroom.  She gets out of bed at 6 am.  She feels better in the morning since starting supplemental oxygen.  She denies morning headache.  She does not use anything to help her fall sleep or stay awake.  She denies sleep walking, or nightmares.  There is no history of restless legs.  She denies sleep hallucinations, sleep paralysis, or cataplexy.  The Epworth score is 15 out of 24.   Physical Exam:  General - No distress ENT - No sinus tenderness, no oral  exudate, no LAN, no thyromegaly, TM clear, pupils equal/reactive, MP 2, decreased AP diameter Cardiac - s1s2 regular, no murmur, pulses symmetric Chest - No wheeze/rales/dullness, good air entry, normal respiratory excursion Back - No focal tenderness Abd - Soft, non-tender, no organomegaly, + bowel sounds Ext - No edema Neuro - Normal strength, cranial nerves intact Skin - No rashes Psych - Normal mood, and behavior  Discussion: She has snoring, sleep disruption, daytime sleepiness, and witnessed apnea.  She is morbidly obese.  Her sleep study showed only very mild OSA.  She did have significant oxygen desaturation.  She reports symptomatic improvement after starting supplemental oxygen at night.  Assessment/plan:  Sleep related hypoxia 2nd to obesity hypoventilation syndrome. - continue 2 liters oxygen at night - will arrange for ONO with 2 liters oxygen  Morbid obesity. - encouraged her to keep up with her weight loss efforts  Obstructive sleep apnea. - very mild >> not sure if this is playing a significant role - if she still has oxygen desaturations on ONO with 2 liters, will then need to consider additional therapy for OSA   Patient Instructions  Will arrange for overnight oxygen test  Follow up in 3 months    Chesley Mires, M.D. Pager [phone] 11/20/2015, 10:17 AM

## 2015-11-20 NOTE — Patient Instructions (Signed)
Will arrange for overnight oxygen test  Follow up in 3 months 

## 2015-11-22 ENCOUNTER — Telehealth (HOSPITAL_COMMUNITY): Payer: Self-pay

## 2015-11-22 DIAGNOSIS — M79644 Pain in right finger(s): Secondary | ICD-10-CM | POA: Diagnosis not present

## 2015-11-22 DIAGNOSIS — M19041 Primary osteoarthritis, right hand: Secondary | ICD-10-CM | POA: Diagnosis not present

## 2015-11-22 NOTE — Telephone Encounter (Signed)
Encounter complete. 

## 2015-11-27 ENCOUNTER — Encounter: Payer: Self-pay | Admitting: Cardiovascular Disease

## 2015-11-27 ENCOUNTER — Ambulatory Visit (HOSPITAL_COMMUNITY)
Admission: RE | Admit: 2015-11-27 | Discharge: 2015-11-27 | Disposition: A | Discharge status: Home / Self Care | Payer: 59 | Source: Ambulatory Visit | Attending: Cardiovascular Disease | Admitting: Cardiovascular Disease

## 2015-11-27 DIAGNOSIS — E669 Obesity, unspecified: Secondary | ICD-10-CM | POA: Insufficient documentation

## 2015-11-27 DIAGNOSIS — Z6841 Body Mass Index (BMI) 40.0 and over, adult: Secondary | ICD-10-CM | POA: Diagnosis not present

## 2015-11-27 DIAGNOSIS — R0789 Other chest pain: Secondary | ICD-10-CM | POA: Insufficient documentation

## 2015-11-27 DIAGNOSIS — R011 Cardiac murmur, unspecified: Secondary | ICD-10-CM | POA: Insufficient documentation

## 2015-11-27 MED ORDER — TECHNETIUM TC 99M TETROFOSMIN IV KIT
28.0000 | PACK | Freq: Once | INTRAVENOUS | Status: AC | PRN
  Administered 2015-11-27: 28 via INTRAVENOUS
  Filled 2015-11-27: qty 28

## 2015-11-27 MED ORDER — REGADENOSON 0.4 MG/5ML IV SOLN
0.4000 mg | Freq: Once | INTRAVENOUS | Status: AC
  Administered 2015-11-27: 0.4 mg via INTRAVENOUS

## 2015-11-28 ENCOUNTER — Ambulatory Visit (HOSPITAL_COMMUNITY)
Admission: RE | Admit: 2015-11-28 | Discharge: 2015-11-28 | Disposition: A | Discharge status: Home / Self Care | Payer: 59 | Source: Ambulatory Visit | Attending: Cardiovascular Disease | Admitting: Cardiovascular Disease

## 2015-11-28 LAB — MYOCARDIAL PERFUSION IMAGING
CHL CUP NUCLEAR SDS: 4
LV dias vol: 82 mL (ref 46–106)
LV sys vol: 27 mL
Peak HR: 86 {beats}/min
Rest HR: 60 {beats}/min
SRS: 0
SSS: 4
TID: 0.95

## 2015-11-28 MED ORDER — TECHNETIUM TC 99M TETROFOSMIN IV KIT
30.2000 | PACK | Freq: Once | INTRAVENOUS | Status: AC | PRN
  Administered 2015-11-28: 30.2 via INTRAVENOUS

## 2015-12-03 ENCOUNTER — Ambulatory Visit (INDEPENDENT_AMBULATORY_CARE_PROVIDER_SITE_OTHER): Payer: 59 | Admitting: Cardiovascular Disease

## 2015-12-03 ENCOUNTER — Encounter: Payer: Self-pay | Admitting: Cardiovascular Disease

## 2015-12-03 ENCOUNTER — Ambulatory Visit
Admission: RE | Admit: 2015-12-03 | Discharge: 2015-12-03 | Disposition: A | Discharge status: Home / Self Care | Payer: 59 | Source: Ambulatory Visit | Attending: Cardiovascular Disease | Admitting: Cardiovascular Disease

## 2015-12-03 VITALS — BP 127/79 | HR 79 | Ht 62.5 in | Wt 233.6 lb

## 2015-12-03 DIAGNOSIS — Z01818 Encounter for other preprocedural examination: Secondary | ICD-10-CM

## 2015-12-03 DIAGNOSIS — R0789 Other chest pain: Secondary | ICD-10-CM | POA: Diagnosis not present

## 2015-12-03 DIAGNOSIS — E7800 Pure hypercholesterolemia, unspecified: Secondary | ICD-10-CM

## 2015-12-03 DIAGNOSIS — R9439 Abnormal result of other cardiovascular function study: Secondary | ICD-10-CM

## 2015-12-03 LAB — CBC WITH DIFFERENTIAL/PLATELET
BASOS ABS: 74 {cells}/uL (ref 0–200)
Basophils Relative: 1 %
EOS ABS: 148 {cells}/uL (ref 15–500)
EOS PCT: 2 %
HCT: 41 % (ref 35.0–45.0)
Hemoglobin: 12.5 g/dL (ref 11.7–15.5)
LYMPHS ABS: 2220 {cells}/uL (ref 850–3900)
Lymphocytes Relative: 30 %
MCH: 23.9 pg — AB (ref 27.0–33.0)
MCHC: 30.5 g/dL — ABNORMAL LOW (ref 32.0–36.0)
MCV: 78.5 fL — ABNORMAL LOW (ref 80.0–100.0)
MPV: 9.4 fL (ref 7.5–12.5)
Monocytes Absolute: 518 cells/uL (ref 200–950)
Monocytes Relative: 7 %
NEUTROS ABS: 4440 {cells}/uL (ref 1500–7800)
Neutrophils Relative %: 60 %
Platelets: 372 10*3/uL (ref 140–400)
RBC: 5.22 MIL/uL — ABNORMAL HIGH (ref 3.80–5.10)
RDW: 16.6 % — ABNORMAL HIGH (ref 11.0–15.0)
WBC: 7.4 10*3/uL (ref 3.8–10.8)

## 2015-12-03 LAB — BASIC METABOLIC PANEL
BUN: 13 mg/dL (ref 7–25)
CHLORIDE: 103 mmol/L (ref 98–110)
CO2: 27 mmol/L (ref 20–31)
Calcium: 9.2 mg/dL (ref 8.6–10.4)
Creat: 0.78 mg/dL (ref 0.50–0.99)
Glucose, Bld: 92 mg/dL (ref 65–99)
Potassium: 4.1 mmol/L (ref 3.5–5.3)
SODIUM: 139 mmol/L (ref 135–146)

## 2015-12-03 NOTE — Patient Instructions (Addendum)
Medication Instructions:  Your physician recommends that you continue on your current medications as directed. Please refer to the Current Medication list given to you today.  Labwork: Bmet/cbc/pt/inr at [facility] lab on the first floor  Testing/Procedures: Your physician has requested that you have a cardiac catheterization. Cardiac catheterization is used to diagnose and/or treat various heart conditions. Doctors may recommend this procedure for a number of different reasons. The most common reason is to evaluate chest pain. Chest pain can be a symptom of coronary artery disease (CAD), and cardiac catheterization can show whether plaque is narrowing or blocking your heart's arteries. This procedure is also used to evaluate the valves, as well as measure the blood flow and oxygen levels in different parts of your heart. For further information please visit HugeFiesta.tn. Please follow instruction sheet, as given.  A chest x-ray takes a picture of the organs and structures inside the chest, including the heart, lungs, and blood vessels. This test can show several things, including, whether the heart is enlarges; whether fluid is building up in the lungs; and whether pacemaker / defibrillator leads are still in place. [facility]   Follow-Up: Your physician recommends that you schedule a follow-up appointment in: 1 month ov  If you need a refill on your cardiac medications before your next appointment, please call your pharmacy.

## 2015-12-03 NOTE — Progress Notes (Signed)
Cardiology Office Note   Date:  12/03/2015   ID:  Mary [name], DOB [birth year], MRN [id number]  PCP:  Mary Schwalbe, MD  Cardiologist:   Mary Latch, MD   Chief Complaint  [name] presents with  . Follow-up  . Dysmenorrhea    in legs occassionally.      History of Present Illness: Mary [name] is a 60 y.o. female with hyperlipidemia and fibromyalgia who presents for follow up on jaw pain.  Mary [name] was seen 10/19/15 with jaw pain.  Mary [name] had two episodes of jaw pain. It lasted for approximately 10 minutes. There was no chest pain, shortness of breath, nausea, or diaphoresis.  She was referred for an ETT 11/06/15 that was nondiagnostic due to baseline EKG changes. She was then referred for an exercise Myoview that revealed LVEF 68% with a small apical septal defect concerning for ischemia.  She denies any chest pain but does continue to have jaw pain.  She exercises with water aerobics and swimming three days per week.  She has some jaw discomfort with exertion.  She also occasionally notes palpitations.  She hasn't noted lower extremity edema, orthopnea or PND.  She reports having a heart catheterization 12 years ago that reportedly did not reveal any significant CAD.   Past Medical History:  Diagnosis Date  . Anaphylaxis    Chicory root, shellfish  . Arthritis    hands, knees, neck - no meds  . Celiac disease   . Depression   . Fibromyalgia   . GERD (gastroesophageal reflux disease)    no meds  . Morbid obesity ([place]) 10/19/2015  . PONV (postoperative nausea and vomiting)   . SVD (spontaneous vaginal delivery)    x 2  . Systolic murmur [id number]    Past Surgical History:  Procedure Laterality Date  . BILATERAL SALPINGECTOMY Bilateral 03/30/2013   Procedure: BILATERAL SALPINGECTOMY;  Surgeon: Mary Lose, MD;  Location: [facility];  Service: Gynecology;  Laterality: Bilateral;  . dermoid cystect    . HYSTEROSCOPY W/D&C  01/28/2012   Procedure:  DILATATION AND CURETTAGE /HYSTEROSCOPY;  Surgeon: Mary Lose, MD;  Location: [facility];  Service: Gynecology;  Laterality: N/A;  . LAPAROSCOPIC ASSISTED VAGINAL HYSTERECTOMY N/A 03/30/2013   Procedure: LAPAROSCOPIC ASSISTED VAGINAL HYSTERECTOMY;  Surgeon: Mary Lose, MD;  Location: [facility];  Service: Gynecology;  Laterality: N/A;  . REPLACEMENT TOTAL KNEE     bilateral  . SPINAL FUSION     C3-C4  . WISDOM TOOTH EXTRACTION       Current Outpatient Prescriptions  Medication Sig Dispense Refill  . acetaminophen (TYLENOL) 500 MG tablet Take 1,000 mg by mouth daily as needed for headache.    . diclofenac sodium (VOLTAREN) 1 % GEL Apply 2 g topically daily as needed (for pain).    . DULoxetine (CYMBALTA) 60 MG capsule Take 60 mg by mouth at bedtime.     Marland Kitchen EPINEPHrine 0.3 mg/0.3 mL IJ SOAJ injection Inject into Mary muscle.    . Multiple Vitamin (MULTIVITAMIN WITH MINERALS) TABS tablet Take 1 tablet by mouth daily.    . Omega-3 Fatty Acids (FISH OIL PO) Take 1 tablet by mouth daily.    . nitroGLYCERIN (NITROSTAT) 0.4 MG SL tablet Place 0.4 mg under Mary tongue every 5 (five) minutes as needed for chest pain.    . OXYGEN Inhale 2 L into Mary lungs at bedtime.     No current facility-administered medications for this visit.  Allergies:   Aspirin; Chicory root [cichorium intybus]; Nsaids; Other; Shellfish allergy; and Celebrex [celecoxib]    Social History:  Mary [name]  reports that she has never smoked. She has never used smokeless tobacco. She reports that she drinks alcohol. She reports that she does not use drugs.   Family History:  Mary [name]'s family history includes Atrial fibrillation in her mother; Bradycardia in her mother; Breast cancer in her maternal grandmother; Leukemia in her father.    ROS:  Please see Mary history of present illness.   Otherwise, review of systems are positive for none.   All other systems are reviewed and negative.    PHYSICAL EXAM: VS:  BP 127/79    Pulse 79   Ht 5' 2.5" (1.588 m)   Wt 106 kg (233 lb 9.6 oz)   BMI 42.05 kg/m  , BMI Body mass index is 42.05 kg/m. GENERAL:  Well appearing HEENT:  Pupils equal round and reactive, fundi not visualized, oral mucosa unremarkable NECK:  No jugular venous distention, waveform within normal limits, carotid upstroke brisk and symmetric, no bruits LYMPHATICS:  No cervical adenopathy LUNGS:  Clear to auscultation bilaterally HEART:  RRR.  PMI not displaced or sustained,S1 and S2 within normal limits, no S3, no S4, no clicks, no rubs, I/VI systolic murmur ABD:  Flat, positive bowel sounds normal in frequency in pitch, no bruits, no rebound, no guarding, no midline pulsatile mass, no hepatomegaly, no splenomegaly EXT:  2 plus pulses throughout, no edema, no cyanosis no clubbing SKIN:  No rashes no nodules NEURO:  Cranial nerves II through XII grossly intact, motor grossly intact throughout PSYCH:  Cognitively intact, oriented to person place and time   EKG:  EKG is ordered today. Mary ekg ordered today demonstrates sinus rhythm. Rate 79 bpm. T-wave in Mary mountains. 09/17/15: Sinus rhythm. Rate 79 bpm. Nonspecific T wave abnormalities.   ETT 11/06/15:  Blood pressure demonstrated a hypertensive response to exercise.  There was downsloping ST segments with T wave inversions in Mary inferior leads that became more pronounced at peak exercise. This was also seen in Mary lateral precordial leads at peak exerxcise.  Nondiagnsotic EKG due to baseline EKG changes. Recommend stress myoview.  Mildly impaired exericse tolerance at 6.2 mets.   Lexiscan Myoview 11/28/15:  Mary left ventricular ejection fraction is hyperdynamic (>65%).  Nuclear stress EF: 68%.  There was no ST segment deviation noted during stress.  Defect 1: There is a small defect of mild severity present in Mary apical septal location.  Findings consistent with ischemia.  This is a low risk study.  Echo 11/06/15: Study  Conclusions  - Left ventricle: Mary cavity size was normal. Wall thickness was   normal. Systolic function was normal. Mary estimated ejection   fraction was in Mary range of 55% to 60%. Wall motion was normal;   there were no regional wall motion abnormalities. Features are   consistent with a pseudonormal left ventricular filling pattern,   with concomitant abnormal relaxation and increased filling   pressure (grade 2 diastolic dysfunction).  Impressions:  - Normal LV systolic function; grade 2 diastolic dysfunction; trace   MR and TR.  Recent Labs: No results found for requested labs within Mary 8760 hours.   10/19/14: Creatinine 0.62 12/2012: Total cholesterol 209, triglycerides 77, HDL 51, LDL 143  Lipid Panel    Component Value Date/Time   CHOL 201 (H) 07/16/2010 0410   TRIG 167 (H) 07/16/2010 0410   HDL 44 07/16/2010 0410  CHOLHDL 4.6 07/16/2010 0410   VLDL 33 07/16/2010 0410   LDLCALC 124 (H) 07/16/2010 0410      Wt Readings from Mary 3 Encounters:  12/03/15 106 kg (233 lb 9.6 oz)  11/27/15 106.6 kg (235 lb)  11/20/15 106.6 kg (235 lb)      ASSESSMENT AND PLAN:  # Atypical chest/jaw pain: Mary [name] has atypical symptoms.  Her ETT was nondiagnostic due to baseline EKG changes.  Her nuclear stress was concerning for apical anterior and apical anteroseptal ischemia.  We discussed that Mary could be either ischemia or breast attenuation artifact.  She is interested in a definitve diagnosis and management.  Therefore we will refer her for LHC.  She is unable to take aspirin 2/2 anaphylaxis.   # Hyperlipidemia: Mary [name] lipids are elevated but we do not have a copy of her most recent report. Labs pending.  We will determine Mary need for a statin based on these results and her LHC.  # Murmur: Mary [name] was noted to have a slight systolic murmur on exam.  No significant valvular heart disease on echo.    # Morbid obesity: Mary [name] was encouraged to keep up her  dietary and exercise changes.    Current medicines are reviewed at length with Mary [name] today.  Mary [name] does not have concerns regarding medicines.  Mary following changes have been made:  no change  Labs/ tests ordered today include:   Orders Placed This Encounter  Procedures  . DG Chest 2 View  . Basic metabolic panel  . CBC with Differential/Platelet  . INR/PT     Disposition:   FU with Danille Oppedisano C. Oval Linsey, MD, [facility] in 1 month    This note was written with Mary assistance of speech recognition software.  Please excuse any transcriptional errors.  Signed, Terrian Ridlon C. Oval Linsey, MD, [facility]  12/03/2015 10:40 PM    [facility]

## 2015-12-04 DIAGNOSIS — I2089 Other forms of angina pectoris: Secondary | ICD-10-CM | POA: Diagnosis present

## 2015-12-04 DIAGNOSIS — R9439 Abnormal result of other cardiovascular function study: Secondary | ICD-10-CM | POA: Diagnosis present

## 2015-12-04 DIAGNOSIS — I208 Other forms of angina pectoris: Secondary | ICD-10-CM | POA: Diagnosis present

## 2015-12-04 LAB — PROTIME-INR
INR: 0.9
PROTHROMBIN TIME: 9.9 s (ref 9.0–11.5)

## 2015-12-05 ENCOUNTER — Other Ambulatory Visit: Payer: Self-pay | Admitting: Cardiovascular Disease

## 2015-12-07 ENCOUNTER — Encounter (HOSPITAL_COMMUNITY)
Admission: RE | Disposition: A | Discharge status: Home / Self Care | Payer: Self-pay | Source: Ambulatory Visit | Attending: Cardiology

## 2015-12-07 ENCOUNTER — Encounter (HOSPITAL_COMMUNITY): Payer: Self-pay | Admitting: *Deleted

## 2015-12-07 ENCOUNTER — Ambulatory Visit (HOSPITAL_COMMUNITY)
Admission: RE | Admit: 2015-12-07 | Discharge: 2015-12-07 | Disposition: A | Discharge status: Home / Self Care | Payer: 59 | Source: Ambulatory Visit | Attending: Cardiology | Admitting: Cardiology

## 2015-12-07 DIAGNOSIS — Z96653 Presence of artificial knee joint, bilateral: Secondary | ICD-10-CM | POA: Diagnosis not present

## 2015-12-07 DIAGNOSIS — I208 Other forms of angina pectoris: Secondary | ICD-10-CM | POA: Insufficient documentation

## 2015-12-07 DIAGNOSIS — Z79899 Other long term (current) drug therapy: Secondary | ICD-10-CM | POA: Diagnosis not present

## 2015-12-07 DIAGNOSIS — Z6841 Body Mass Index (BMI) 40.0 and over, adult: Secondary | ICD-10-CM | POA: Insufficient documentation

## 2015-12-07 DIAGNOSIS — R0789 Other chest pain: Secondary | ICD-10-CM | POA: Diagnosis not present

## 2015-12-07 DIAGNOSIS — E785 Hyperlipidemia, unspecified: Secondary | ICD-10-CM | POA: Insufficient documentation

## 2015-12-07 DIAGNOSIS — I340 Nonrheumatic mitral (valve) insufficiency: Secondary | ICD-10-CM | POA: Insufficient documentation

## 2015-12-07 DIAGNOSIS — R9439 Abnormal result of other cardiovascular function study: Secondary | ICD-10-CM | POA: Diagnosis not present

## 2015-12-07 DIAGNOSIS — F329 Major depressive disorder, single episode, unspecified: Secondary | ICD-10-CM | POA: Insufficient documentation

## 2015-12-07 DIAGNOSIS — Z9981 Dependence on supplemental oxygen: Secondary | ICD-10-CM | POA: Insufficient documentation

## 2015-12-07 DIAGNOSIS — R011 Cardiac murmur, unspecified: Secondary | ICD-10-CM | POA: Insufficient documentation

## 2015-12-07 DIAGNOSIS — R6884 Jaw pain: Secondary | ICD-10-CM | POA: Diagnosis not present

## 2015-12-07 DIAGNOSIS — Z8249 Family history of ischemic heart disease and other diseases of the circulatory system: Secondary | ICD-10-CM | POA: Diagnosis not present

## 2015-12-07 DIAGNOSIS — I2089 Other forms of angina pectoris: Secondary | ICD-10-CM | POA: Diagnosis present

## 2015-12-07 HISTORY — PX: CARDIAC CATHETERIZATION: SHX172

## 2015-12-07 LAB — GLUCOSE, CAPILLARY: GLUCOSE-CAPILLARY: 137 mg/dL — AB (ref 65–99)

## 2015-12-07 SURGERY — LEFT HEART CATH AND CORONARY ANGIOGRAPHY

## 2015-12-07 MED ORDER — MIDAZOLAM HCL 2 MG/2ML IJ SOLN
INTRAMUSCULAR | Status: AC
  Filled 2015-12-07: qty 2

## 2015-12-07 MED ORDER — VERAPAMIL HCL 2.5 MG/ML IV SOLN
INTRAVENOUS | Status: AC
  Filled 2015-12-07: qty 2

## 2015-12-07 MED ORDER — VERAPAMIL HCL 2.5 MG/ML IV SOLN
INTRAVENOUS | Status: DC | PRN
  Administered 2015-12-07: 10 mL via INTRA_ARTERIAL

## 2015-12-07 MED ORDER — LIDOCAINE HCL (PF) 1 % IJ SOLN
INTRAMUSCULAR | Status: AC
  Filled 2015-12-07: qty 30

## 2015-12-07 MED ORDER — ONDANSETRON HCL 4 MG/2ML IJ SOLN: 4.0000 mg | Freq: Four times a day (QID) | INTRAMUSCULAR | Status: DC | PRN

## 2015-12-07 MED ORDER — FENTANYL CITRATE (PF) 100 MCG/2ML IJ SOLN
INTRAMUSCULAR | Status: DC | PRN
  Administered 2015-12-07 (×3): 25 ug via INTRAVENOUS

## 2015-12-07 MED ORDER — SODIUM CHLORIDE 0.9 % WEIGHT BASED INFUSION: 3.0000 mL/kg/h | INTRAVENOUS | Status: DC

## 2015-12-07 MED ORDER — HEPARIN SODIUM (PORCINE) 1000 UNIT/ML IJ SOLN
INTRAMUSCULAR | Status: DC | PRN
  Administered 2015-12-07: 5000 [IU] via INTRAVENOUS

## 2015-12-07 MED ORDER — LIDOCAINE HCL (PF) 1 % IJ SOLN
INTRAMUSCULAR | Status: DC | PRN
  Administered 2015-12-07: 2 mL via INTRADERMAL

## 2015-12-07 MED ORDER — IOPAMIDOL (ISOVUE-370) INJECTION 76%
INTRAVENOUS | Status: AC
  Filled 2015-12-07: qty 100

## 2015-12-07 MED ORDER — HEPARIN (PORCINE) IN NACL 2-0.9 UNIT/ML-% IJ SOLN
INTRAMUSCULAR | Status: DC | PRN
  Administered 2015-12-07: 1500 mL

## 2015-12-07 MED ORDER — HEPARIN SODIUM (PORCINE) 1000 UNIT/ML IJ SOLN
INTRAMUSCULAR | Status: AC
  Filled 2015-12-07: qty 1

## 2015-12-07 MED ORDER — SODIUM CHLORIDE 0.9 % WEIGHT BASED INFUSION
3.0000 mL/kg/h | INTRAVENOUS | Status: AC
  Administered 2015-12-07: 3 mL/kg/h via INTRAVENOUS

## 2015-12-07 MED ORDER — MIDAZOLAM HCL 2 MG/2ML IJ SOLN
INTRAMUSCULAR | Status: DC | PRN
  Administered 2015-12-07 (×2): 1 mg via INTRAVENOUS

## 2015-12-07 MED ORDER — SODIUM CHLORIDE 0.9 % IV SOLN: 250.0000 mL | INTRAVENOUS | Status: DC | PRN

## 2015-12-07 MED ORDER — HEPARIN (PORCINE) IN NACL 2-0.9 UNIT/ML-% IJ SOLN
INTRAMUSCULAR | Status: AC
  Filled 2015-12-07: qty 1000

## 2015-12-07 MED ORDER — SODIUM CHLORIDE 0.9% FLUSH: 3.0000 mL | Freq: Two times a day (BID) | INTRAVENOUS | Status: DC

## 2015-12-07 MED ORDER — SODIUM CHLORIDE 0.9% FLUSH: 3.0000 mL | INTRAVENOUS | Status: DC | PRN

## 2015-12-07 MED ORDER — FENTANYL CITRATE (PF) 100 MCG/2ML IJ SOLN
INTRAMUSCULAR | Status: AC
  Filled 2015-12-07: qty 2

## 2015-12-07 MED ORDER — ACETAMINOPHEN 325 MG PO TABS: 650.0000 mg | ORAL_TABLET | ORAL | Status: DC | PRN

## 2015-12-07 MED ORDER — SODIUM CHLORIDE 0.9 % WEIGHT BASED INFUSION: 1.0000 mL/kg/h | INTRAVENOUS | Status: DC

## 2015-12-07 MED ORDER — IOPAMIDOL (ISOVUE-370) INJECTION 76%
INTRAVENOUS | Status: DC | PRN
  Administered 2015-12-07: 60 mL via INTRA_ARTERIAL

## 2015-12-07 SURGICAL SUPPLY — 11 items
CATH INFINITI 5FR ANG PIGTAIL (CATHETERS) ×1 IMPLANT
CATH OPTITORQUE SARAH 4.5 5F (CATHETERS) ×1 IMPLANT
DEVICE RAD COMP TR BAND LRG (VASCULAR PRODUCTS) ×1 IMPLANT
GLIDESHEATH SLEND A-KIT 6F 22G (SHEATH) ×1 IMPLANT
GUIDEWIRE INQWIRE 1.5J.035X260 (WIRE) IMPLANT
INQWIRE 1.5J .035X260CM (WIRE) ×2
KIT HEART LEFT (KITS) ×2 IMPLANT
PACK CARDIAC CATHETERIZATION (CUSTOM PROCEDURE TRAY) ×2 IMPLANT
SYR MEDRAD MARK V 150ML (SYRINGE) ×2 IMPLANT
TRANSDUCER W/STOPCOCK (MISCELLANEOUS) ×2 IMPLANT
TUBING CIL FLEX 10 FLL-RA (TUBING) ×2 IMPLANT

## 2015-12-07 NOTE — H&P (View-Only) (Signed)
Cardiology Office Note   Date:  12/03/2015   ID:  Mary [name], DOB Aug 10, 1955, MRN [id number]  PCP:  Vidal Schwalbe, MD  Cardiologist:   Skeet Latch, MD   Chief Complaint  Patient presents with  . Follow-up  . Dysmenorrhea    in legs occassionally.      History of Present Illness: Mary [name] is a 60 y.o. female with hyperlipidemia and fibromyalgia who presents for follow up on jaw pain.  Mary [name] was seen 10/19/15 with jaw pain.  Last month Mary [name] had two episodes of jaw pain. It lasted for approximately 10 minutes. There was no chest pain, shortness of breath, nausea, or diaphoresis.  She was referred for an ETT 11/06/15 that was nondiagnostic due to baseline EKG changes. She was then referred for an exercise Myoview that revealed LVEF 68% with a small apical septal defect concerning for ischemia.  She denies any chest pain but does continue to have jaw pain.  She exercises with water aerobics and swimming three days per week.  She has some jaw discomfort with exertion.  She also occasionally notes palpitations.  She hasn't noted lower extremity edema, orthopnea or PND.  She reports having a heart catheterization 12 years ago that reportedly did not reveal any significant CAD.   Past Medical History:  Diagnosis Date  . Anaphylaxis    Chicory root, shellfish  . Arthritis    hands, knees, neck - no meds  . Celiac disease   . Depression   . Fibromyalgia   . GERD (gastroesophageal reflux disease)    no meds  . Morbid obesity ([place]) 10/19/2015  . PONV (postoperative nausea and vomiting)   . SVD (spontaneous vaginal delivery)    x 2  . Systolic murmur [id number]    Past Surgical History:  Procedure Laterality Date  . BILATERAL SALPINGECTOMY Bilateral 03/30/2013   Procedure: BILATERAL SALPINGECTOMY;  Surgeon: Thurnell Lose, MD;  Location: [facility];  Service: Gynecology;  Laterality: Bilateral;  . dermoid cystect    . HYSTEROSCOPY W/D&C  01/28/2012   Procedure:  DILATATION AND CURETTAGE /HYSTEROSCOPY;  Surgeon: Thurnell Lose, MD;  Location: [facility];  Service: Gynecology;  Laterality: N/A;  . LAPAROSCOPIC ASSISTED VAGINAL HYSTERECTOMY N/A 03/30/2013   Procedure: LAPAROSCOPIC ASSISTED VAGINAL HYSTERECTOMY;  Surgeon: Thurnell Lose, MD;  Location: [facility];  Service: Gynecology;  Laterality: N/A;  . REPLACEMENT TOTAL KNEE     bilateral  . SPINAL FUSION     C3-C4  . WISDOM TOOTH EXTRACTION       Current Outpatient Prescriptions  Medication Sig Dispense Refill  . acetaminophen (TYLENOL) 500 MG tablet Take 1,000 mg by mouth daily as needed for headache.    . diclofenac sodium (VOLTAREN) 1 % GEL Apply 2 g topically daily as needed (for pain).    . DULoxetine (CYMBALTA) 60 MG capsule Take 60 mg by mouth at bedtime.     Marland Kitchen EPINEPHrine 0.3 mg/0.3 mL IJ SOAJ injection Inject into the muscle.    . Multiple Vitamin (MULTIVITAMIN WITH MINERALS) TABS tablet Take 1 tablet by mouth daily.    . Omega-3 Fatty Acids (FISH OIL PO) Take 1 tablet by mouth daily.    . nitroGLYCERIN (NITROSTAT) 0.4 MG SL tablet Place 0.4 mg under the tongue every 5 (five) minutes as needed for chest pain.    . OXYGEN Inhale 2 L into the lungs at bedtime.     No current facility-administered medications for this visit.  Allergies:   Aspirin; Chicory root [cichorium intybus]; Nsaids; Other; Shellfish allergy; and Celebrex [celecoxib]    Social History:  The patient  reports that she has never smoked. She has never used smokeless tobacco. She reports that she drinks alcohol. She reports that she does not use drugs.   Family History:  The patient's family history includes Atrial fibrillation in her mother; Bradycardia in her mother; Breast cancer in her maternal grandmother; Leukemia in her father.    ROS:  Please see the history of present illness.   Otherwise, review of systems are positive for none.   All other systems are reviewed and negative.    PHYSICAL EXAM: VS:  BP 127/79    Pulse 79   Ht 5' 2.5" (1.588 m)   Wt 106 kg (233 lb 9.6 oz)   BMI 42.05 kg/m  , BMI Body mass index is 42.05 kg/m. GENERAL:  Well appearing HEENT:  Pupils equal round and reactive, fundi not visualized, oral mucosa unremarkable NECK:  No jugular venous distention, waveform within normal limits, carotid upstroke brisk and symmetric, no bruits LYMPHATICS:  No cervical adenopathy LUNGS:  Clear to auscultation bilaterally HEART:  RRR.  PMI not displaced or sustained,S1 and S2 within normal limits, no S3, no S4, no clicks, no rubs, I/VI systolic murmur ABD:  Flat, positive bowel sounds normal in frequency in pitch, no bruits, no rebound, no guarding, no midline pulsatile mass, no hepatomegaly, no splenomegaly EXT:  2 plus pulses throughout, no edema, no cyanosis no clubbing SKIN:  No rashes no nodules NEURO:  Cranial nerves II through XII grossly intact, motor grossly intact throughout PSYCH:  Cognitively intact, oriented to person place and time   EKG:  EKG is ordered today. The ekg ordered today demonstrates sinus rhythm. Rate 79 bpm. T-wave in the [place]. 09/17/15: Sinus rhythm. Rate 79 bpm. Nonspecific T wave abnormalities.   ETT 11/06/15:  Blood pressure demonstrated a hypertensive response to exercise.  There was downsloping ST segments with T wave inversions in the inferior leads that became more pronounced at peak exercise. This was also seen in the lateral precordial leads at peak exerxcise.  Nondiagnsotic EKG due to baseline EKG changes. Recommend stress myoview.  Mildly impaired exericse tolerance at 6.2 mets.   Lexiscan Myoview 11/28/15:  The left ventricular ejection fraction is hyperdynamic (>65%).  Nuclear stress EF: 68%.  There was no ST segment deviation noted during stress.  Defect 1: There is a small defect of mild severity present in the apical septal location.  Findings consistent with ischemia.  This is a low risk study.  Echo 11/06/15: Study  Conclusions  - Left ventricle: The cavity size was normal. Wall thickness was   normal. Systolic function was normal. The estimated ejection   fraction was in the range of 55% to 60%. Wall motion was normal;   there were no regional wall motion abnormalities. Features are   consistent with a pseudonormal left ventricular filling pattern,   with concomitant abnormal relaxation and increased filling   pressure (grade 2 diastolic dysfunction).  Impressions:  - Normal LV systolic function; grade 2 diastolic dysfunction; trace   MR and TR.  Recent Labs: No results found for requested labs within last 8760 hours.   10/19/14: Creatinine 0.62 12/2012: Total cholesterol 209, triglycerides 77, HDL 51, LDL 143  Lipid Panel    Component Value Date/Time   CHOL 201 (H) 07/16/2010 0410   TRIG 167 (H) 07/16/2010 0410   HDL 44 07/16/2010 0410  CHOLHDL 4.6 07/16/2010 0410   VLDL 33 07/16/2010 0410   LDLCALC 124 (H) 07/16/2010 0410      Wt Readings from Last 3 Encounters:  12/03/15 106 kg (233 lb 9.6 oz)  11/27/15 106.6 kg (235 lb)  11/20/15 106.6 kg (235 lb)      ASSESSMENT AND PLAN:  # Atypical chest/jaw pain: Mary [name] has atypical symptoms.  Her ETT was nondiagnostic due to baseline EKG changes.  Her nuclear stress was concerning for apical anterior and apical anteroseptal ischemia.  We discussed that the could be either ischemia or breast attenuation artifact.  She is interested in a definitve diagnosis and management.  Therefore we will refer her for LHC.  She is unable to take aspirin 2/2 anaphylaxis.   # Hyperlipidemia: Mary [name] lipids are elevated but we do not have a copy of her most recent report. Labs pending.  We will determine the need for a statin based on these results and her LHC.  # Murmur: Mary [name] was noted to have a slight systolic murmur on exam.  No significant valvular heart disease on echo.    # Morbid obesity: Mary [name] was encouraged to keep up her  dietary and exercise changes.    Current medicines are reviewed at length with the patient today.  The patient does not have concerns regarding medicines.  The following changes have been made:  no change  Labs/ tests ordered today include:   Orders Placed This Encounter  Procedures  . DG Chest 2 View  . Basic metabolic panel  . CBC with Differential/Platelet  . INR/PT     Disposition:   FU with Lovelyn Sheeran C. Oval Linsey, MD, [facility] in 1 month    This note was written with the assistance of speech recognition software.  Please excuse any transcriptional errors.  Signed, Ellianna Ruest C. Oval Linsey, MD, [facility]  12/03/2015 10:40 PM    [facility]

## 2015-12-07 NOTE — Interval H&P Note (Signed)
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 Given the potential for LAD distribution ischemia on the stress test, she was referred for catheterization.  The various methods of treatment have been discussed with the patient and family.   After consideration of risks, benefits and other options for treatment, the patient has consented to  Procedure(s): Left Heart Cath and Coronary Angiography (N/A) with Possible Percutaneous Coronary Intervention as a surgical intervention .  The patient's history has been reviewed, patient examined, no change in status, stable for surgery.  I have reviewed the patient's chart and labs.  Questions were answered to the patient's satisfaction.     Cath Lab Visit (complete for each Cath Lab visit)  Clinical Evaluation Leading to the Procedure:   ACS: No.  Non-ACS:    Anginal Classification: CCS II  Anti-ischemic medical therapy: Minimal Therapy (1 class of medications)  Non-Invasive Test Results: Low-risk stress test findings: cardiac mortality <1%/year  Prior CABG: No previous CABG    Glenetta Hew

## 2015-12-07 NOTE — Discharge Instructions (Signed)

## 2015-12-10 ENCOUNTER — Encounter (HOSPITAL_COMMUNITY): Payer: Self-pay | Admitting: Cardiology

## 2015-12-13 DIAGNOSIS — R0902 Hypoxemia: Secondary | ICD-10-CM | POA: Diagnosis not present

## 2015-12-13 DIAGNOSIS — G4734 Idiopathic sleep related nonobstructive alveolar hypoventilation: Secondary | ICD-10-CM | POA: Diagnosis not present

## 2015-12-13 DIAGNOSIS — G4733 Obstructive sleep apnea (adult) (pediatric): Secondary | ICD-10-CM | POA: Diagnosis not present

## 2015-12-20 ENCOUNTER — Encounter: Payer: Self-pay | Admitting: Pulmonary Disease

## 2015-12-21 MED FILL — DULoxetine HCL 60 MG CPEP: 60 | 90 days supply | Qty: 90 | Fill #2

## 2016-01-08 DIAGNOSIS — E119 Type 2 diabetes mellitus without complications: Secondary | ICD-10-CM | POA: Diagnosis not present

## 2016-01-10 ENCOUNTER — Telehealth: Payer: Self-pay | Admitting: *Deleted

## 2016-01-10 DIAGNOSIS — M79644 Pain in right finger(s): Secondary | ICD-10-CM | POA: Diagnosis not present

## 2016-01-10 DIAGNOSIS — M19041 Primary osteoarthritis, right hand: Secondary | ICD-10-CM | POA: Diagnosis not present

## 2016-01-10 DIAGNOSIS — M79641 Pain in right hand: Secondary | ICD-10-CM | POA: Diagnosis not present

## 2016-01-10 DIAGNOSIS — R200 Anesthesia of skin: Secondary | ICD-10-CM | POA: Diagnosis not present

## 2016-01-10 DIAGNOSIS — M19042 Primary osteoarthritis, left hand: Secondary | ICD-10-CM | POA: Diagnosis not present

## 2016-01-10 NOTE — Telephone Encounter (Signed)
-----   Message from Skeet Latch, MD sent at 12/21/2015 11:09 AM EST ----- Lipids reviewed.  Cholesterol is a little elevated.  However, given her normal coronary arteries, we will not start a statin at this time. Lets work on limiting fried and fatty foods.  Increase exercise to at least 30-40 minutes most days of the week.

## 2016-01-10 NOTE — Telephone Encounter (Signed)
Advised patient

## 2016-01-12 DIAGNOSIS — G4734 Idiopathic sleep related nonobstructive alveolar hypoventilation: Secondary | ICD-10-CM | POA: Diagnosis not present

## 2016-01-12 DIAGNOSIS — R0902 Hypoxemia: Secondary | ICD-10-CM | POA: Diagnosis not present

## 2016-01-12 DIAGNOSIS — G4733 Obstructive sleep apnea (adult) (pediatric): Secondary | ICD-10-CM | POA: Diagnosis not present

## 2016-01-16 ENCOUNTER — Ambulatory Visit: Payer: 59 | Admitting: Cardiovascular Disease

## 2016-02-12 DIAGNOSIS — G4734 Idiopathic sleep related nonobstructive alveolar hypoventilation: Secondary | ICD-10-CM | POA: Diagnosis not present

## 2016-02-12 DIAGNOSIS — R0902 Hypoxemia: Secondary | ICD-10-CM | POA: Diagnosis not present

## 2016-02-12 DIAGNOSIS — G4733 Obstructive sleep apnea (adult) (pediatric): Secondary | ICD-10-CM | POA: Diagnosis not present

## 2016-02-14 ENCOUNTER — Encounter: Payer: 59 | Attending: Family Medicine | Admitting: *Deleted

## 2016-02-14 DIAGNOSIS — Z713 Dietary counseling and surveillance: Secondary | ICD-10-CM | POA: Diagnosis not present

## 2016-02-14 DIAGNOSIS — E119 Type 2 diabetes mellitus without complications: Secondary | ICD-10-CM | POA: Insufficient documentation

## 2016-02-14 NOTE — Patient Instructions (Signed)
Plan:  Aim for 2 Carb Choices per meal (30 grams) +/- 1 either way  Aim for 0-1 Carbs per snack if hungry  Include protein in moderation with your meals and snacks Consider reading food labels for Total Carbohydrate of foods Consider  increasing your activity level daily as tolerated Consider checking BG at alternate times per day as directed by MD

## 2016-02-18 MED FILL — AMOXICILLIN 500 MG CAPSULE: 500 | 2 days supply | Qty: 8 | Fill #0

## 2016-02-18 NOTE — Progress Notes (Signed)
Diabetes Self-Management Education  Visit Type: First/Initial  Appt. Start Time: 1100 Appt. End Time: 1230  02/18/2016  [name], identified by name and date of birth, is a 61 y.o. female with a diagnosis of Diabetes: Type 2. Patient states history of GDM with both pregnancies about 30 years ago. She has been checking her BG about once a week the past year or so. She also has Celiac Disease so is following a gluten free diet already. She also shared that with her pain with fibromyalgia, she was prescribed O2 at night and that has improved her pain significantly.   ASSESSMENT  Height 5' 2.5" (1.588 m), weight 233 lb 4.8 oz (105.8 kg). Body mass index is 41.99 kg/m.      Diabetes Self-Management Education - 02/14/16 1106      Visit Information   Visit Type First/Initial     Initial Visit   Diabetes Type Type 2   Are you currently following a meal plan? Yes   What type of meal plan do you follow? gluten free for celiac disease   Are you taking your medications as prescribed? Not on Medications   Date Diagnosed Dec., 2017     Health Coping   How would you rate your overall health? Good     Psychosocial Assessment   Patient Belief/Attitude about Diabetes Motivated to manage diabetes  and defeated with burn out too   Self-care barriers None   Other persons present Patient   Patient Concerns Nutrition/Meal planning   Special Needs None   What is the last grade level you completed in school? college graduate     Pre-Education Assessment   Patient understands the diabetes disease and treatment process. Needs Instruction   Patient understands incorporating nutritional management into lifestyle. Needs Instruction   Patient undertands incorporating physical activity into lifestyle. Needs Instruction   Patient understands monitoring blood glucose, interpreting and using results Needs Instruction   Patient understands prevention, detection, and treatment of chronic  complications. Needs Instruction   Patient understands how to develop strategies to address psychosocial issues. Needs Instruction   Patient understands how to develop strategies to promote health/change behavior. Needs Instruction     Complications   Last HgB A1C per patient/outside source 6.7 %   How often do you check your blood sugar? 1-2 times/day   Number of hypoglycemic episodes per month 0   Have you had a dilated eye exam in the past 12 months? No   Have you had a dental exam in the past 12 months? Yes   Are you checking your feet? Yes   How many days per week are you checking your feet? 6     Dietary Intake   Breakfast at work: 2 boiled eggs OR peanuts OR bacon   Snack (morning) nuts (pre packaged snack foods)   Lunch protein - chicken salad, OR meat with salad   Snack (afternoon) simialr to AM   Dinner soup OR pre- made salad from grocery store or fast food OR cheese and gluten free crackers OR eats out with friends   Beverage(s) coffee with cream and Stevia, water, diet soda occasionally, unsweet tea,      Exercise   How many days per week to you exercise? 2   How many minutes per day do you exercise? 45   Total minutes per week of exercise 90     Patient Education   Previous Diabetes Education No   Disease state  Definition of diabetes,  type 1 and 2, and the diagnosis of diabetes   Nutrition management  Role of diet in the treatment of diabetes and the relationship between the three main macronutrients and blood glucose level;Food label reading, portion sizes and measuring food.;Carbohydrate counting   Physical activity and exercise  Role of exercise on diabetes management, blood pressure control and cardiac health.   Monitoring Purpose and frequency of SMBG.;Identified appropriate SMBG and/or A1C goals.   Chronic complications Relationship between chronic complications and blood glucose control   Psychosocial adjustment Worked with patient to identify barriers to care  and solutions     Individualized Goals (developed by patient)   Nutrition Follow meal plan discussed   Physical Activity Exercise 3-5 times per week   Medications Not Applicable   Monitoring  test blood glucose pre and post meals as discussed     Post-Education Assessment   Patient understands the diabetes disease and treatment process. Demonstrates understanding / competency   Patient understands incorporating nutritional management into lifestyle. Demonstrates understanding / competency   Patient undertands incorporating physical activity into lifestyle. Demonstrates understanding / competency   Patient understands using medications safely. Demonstrates understanding / competency   Patient understands monitoring blood glucose, interpreting and using results Demonstrates understanding / competency   Patient understands prevention, detection, and treatment of acute complications. Demonstrates understanding / competency   Patient understands prevention, detection, and treatment of chronic complications. Demonstrates understanding / competency   Patient understands how to develop strategies to address psychosocial issues. Demonstrates understanding / competency   Patient understands how to develop strategies to promote health/change behavior. Demonstrates understanding / competency     Outcomes   Expected Outcomes Demonstrated interest in learning. Expect positive outcomes   Future DMSE PRN   Program Status Completed      Individualized Plan for Diabetes Self-Management Training:   Learning Objective:  Patient will have a greater understanding of diabetes self-management. Patient education plan is to attend individual and/or group sessions per assessed needs and concerns.   Plan:   Patient Instructions  Plan:  Aim for 2 Carb Choices per meal (30 grams) +/- 1 either way  Aim for 0-1 Carbs per snack if hungry  Include protein in moderation with your meals and snacks Consider reading  food labels for Total Carbohydrate of foods Consider  increasing your activity level daily as tolerated Consider checking BG at alternate times per day as directed by MD   Expected Outcomes:  Demonstrated interest in learning. Expect positive outcomes  Education material provided: Living Well with Diabetes, A1C conversion sheet, Snack sheet and Carbohydrate counting sheet  If problems or questions, patient to contact team via:  Phone and Email  Future DSME appointment: PRN

## 2016-02-22 ENCOUNTER — Ambulatory Visit: Payer: Self-pay | Admitting: Pulmonary Disease

## 2016-03-14 DIAGNOSIS — R0902 Hypoxemia: Secondary | ICD-10-CM | POA: Diagnosis not present

## 2016-03-14 DIAGNOSIS — G4734 Idiopathic sleep related nonobstructive alveolar hypoventilation: Secondary | ICD-10-CM | POA: Diagnosis not present

## 2016-03-14 DIAGNOSIS — G4733 Obstructive sleep apnea (adult) (pediatric): Secondary | ICD-10-CM | POA: Diagnosis not present

## 2016-03-25 MED FILL — DULoxetine HCL 60 MG CPEP: 60 | 90 days supply | Qty: 90 | Fill #3

## 2016-03-26 MED FILL — FREESTYLE LITE METER: 30 days supply | Qty: 1 | Fill #0

## 2016-03-26 MED FILL — FREESTYLE LITE TEST STRIP: 90 days supply | Qty: 200 | Fill #0

## 2016-03-26 MED FILL — FREESTYLE LANCETS: 90 days supply | Qty: 200 | Fill #0

## 2016-04-08 ENCOUNTER — Encounter: Payer: Self-pay | Admitting: Pulmonary Disease

## 2016-04-08 ENCOUNTER — Ambulatory Visit (INDEPENDENT_AMBULATORY_CARE_PROVIDER_SITE_OTHER): Payer: 59 | Admitting: Pulmonary Disease

## 2016-04-08 VITALS — BP 110/78 | HR 74 | Ht 63.0 in | Wt 237.6 lb

## 2016-04-08 DIAGNOSIS — Z6841 Body Mass Index (BMI) 40.0 and over, adult: Secondary | ICD-10-CM | POA: Diagnosis not present

## 2016-04-08 DIAGNOSIS — E662 Morbid (severe) obesity with alveolar hypoventilation: Secondary | ICD-10-CM

## 2016-04-08 DIAGNOSIS — G4734 Idiopathic sleep related nonobstructive alveolar hypoventilation: Secondary | ICD-10-CM | POA: Diagnosis not present

## 2016-04-08 DIAGNOSIS — G4733 Obstructive sleep apnea (adult) (pediatric): Secondary | ICD-10-CM

## 2016-04-08 NOTE — Progress Notes (Signed)
Current Outpatient Prescriptions on File Prior to Visit  Medication Sig  . acetaminophen (TYLENOL) 500 MG tablet Take 1,000 mg by mouth daily as needed for headache.  . diclofenac sodium (VOLTAREN) 1 % GEL Apply 2 g topically daily as needed (for pain).  . DULoxetine (CYMBALTA) 60 MG capsule Take 60 mg by mouth at bedtime.   Marland Kitchen EPINEPHrine 0.3 mg/0.3 mL IJ SOAJ injection Inject 0.3 mg into the muscle once as needed (allergic reaction).   . Multiple Vitamin (MULTIVITAMIN WITH MINERALS) TABS tablet Take 1 tablet by mouth daily.  . nitroGLYCERIN (NITROSTAT) 0.4 MG SL tablet Place 0.4 mg under the tongue every 5 (five) minutes as needed for chest pain.  . Omega-3 Fatty Acids (FISH OIL PO) Take 1 tablet by mouth daily.  . OXYGEN Inhale 2 L into the lungs at bedtime.   No current facility-administered medications on file prior to visit.      Chief Complaint  Patient presents with  . Follow-up    Discuss ONO results. Pt denies any sleep or breathing issues. Still using 2 liters O2 at night. Pt states that she is sleeping better at night since starting the O2 and does not wake up often with the sense that she is "going to die".  Wants a smaller O2 concentrator for her home/travel nocturnal O2 use     Sleep tests PSG 08/17/15 >> AHI 6.1, SpO2 low 89% ONO with 2 liters 12/20/15 >> test time 5 hrs 34 min.  Basal SpO2 94%, low SpO2 83%.  Spent 8 min with SpO2 < 88%.  Cardiac tests Echo 11/06/15 >> EF 55 to 60%, grade 2 DD  Past medical history Celiac disease, Depression, Fibromyalgia, GERD  Past surgical history, Family history, Social history, Allergies all reviewed.  Vital Signs BP 110/78 (BP Location: Left Arm, Cuff Size: Normal)   Pulse 74   Ht 5\' 3"  (1.6 m)   Wt 237 lb 9.6 oz (107.8 kg)   SpO2 97%   BMI 42.09 kg/m   History of Present Illness Mary [name] is a 61 y.o. female with chronic respiratory failure from obstructive sleep apnea, and obesity hypoventilation  syndrome.  She uses oxygen every night.  She feels this has helped her sleep.  She still gets episodes of waking up with a startle.  She can't sleep on her back.  She still snores occasionally.  She has been trying to work on her weight, but hasn't seen results she was hoping for.  She finds it difficult to travel with her oxygen equipment.  Her ONO from December 20, 2015 was only received from DME today.  This showed oxygen desaturation pattern that would be seen with REM related sleep apnea.  She reports that she doesn't dream much.  Physical Exam  General - No distress ENT - No sinus tenderness, no oral exudate, no LAN, MP 2, decreased AP diameter Cardiac - s1s2 regular, no murmur Chest - No wheeze/rales/dullness Back - No focal tenderness Abd - Soft, non-tender Ext - No edema Neuro - Normal strength Skin - No rashes Psych - normal mood, and behavior   Assessment/Plan  Obstructive sleep apnea. - her oxygen desaturation pattern on ONO is suggestive that she still has significant sleep apnea - will arrange for in lab titration study  Obesity hypoventilation syndrome. - will determine if she needs to continue supplemental oxygen at night after her CPAP titration study - continue 2 liters oxygen at night for now  Morbid obesity. - reviewed how  her weight is impacting her respiratory issues - discussed options to assist with weight loss  Time spent 32 minutes   Patient Instructions  Will arrange for in lab CPAP titration sleep study Will call to arrange for follow up after sleep study reviewed     Mary Mires, MD [facility] Pager:  [phone] 04/08/2016, 2:26 PM

## 2016-04-08 NOTE — Patient Instructions (Signed)
Will arrange for in lab CPAP titration sleep study Will call to arrange for follow up after sleep study reviewed  

## 2016-04-11 DIAGNOSIS — G4733 Obstructive sleep apnea (adult) (pediatric): Secondary | ICD-10-CM | POA: Diagnosis not present

## 2016-04-11 DIAGNOSIS — G4734 Idiopathic sleep related nonobstructive alveolar hypoventilation: Secondary | ICD-10-CM | POA: Diagnosis not present

## 2016-04-11 DIAGNOSIS — R0902 Hypoxemia: Secondary | ICD-10-CM | POA: Diagnosis not present

## 2016-04-15 DIAGNOSIS — E119 Type 2 diabetes mellitus without complications: Secondary | ICD-10-CM | POA: Diagnosis not present

## 2016-04-15 DIAGNOSIS — E785 Hyperlipidemia, unspecified: Secondary | ICD-10-CM | POA: Diagnosis not present

## 2016-04-15 DIAGNOSIS — E559 Vitamin D deficiency, unspecified: Secondary | ICD-10-CM | POA: Diagnosis not present

## 2016-04-15 DIAGNOSIS — R768 Other specified abnormal immunological findings in serum: Secondary | ICD-10-CM | POA: Diagnosis not present

## 2016-04-15 DIAGNOSIS — Z23 Encounter for immunization: Secondary | ICD-10-CM | POA: Diagnosis not present

## 2016-04-15 DIAGNOSIS — M797 Fibromyalgia: Secondary | ICD-10-CM | POA: Diagnosis not present

## 2016-05-12 DIAGNOSIS — G4733 Obstructive sleep apnea (adult) (pediatric): Secondary | ICD-10-CM | POA: Diagnosis not present

## 2016-05-12 DIAGNOSIS — R0902 Hypoxemia: Secondary | ICD-10-CM | POA: Diagnosis not present

## 2016-05-12 DIAGNOSIS — G4734 Idiopathic sleep related nonobstructive alveolar hypoventilation: Secondary | ICD-10-CM | POA: Diagnosis not present

## 2016-05-15 ENCOUNTER — Ambulatory Visit (HOSPITAL_BASED_OUTPATIENT_CLINIC_OR_DEPARTMENT_OTHER): Payer: 59 | Attending: Pulmonary Disease | Admitting: Pulmonary Disease

## 2016-05-15 DIAGNOSIS — G4733 Obstructive sleep apnea (adult) (pediatric): Secondary | ICD-10-CM | POA: Insufficient documentation

## 2016-05-19 ENCOUNTER — Telehealth: Payer: Self-pay | Admitting: Pulmonary Disease

## 2016-05-19 DIAGNOSIS — M19041 Primary osteoarthritis, right hand: Secondary | ICD-10-CM | POA: Diagnosis not present

## 2016-05-19 DIAGNOSIS — G4733 Obstructive sleep apnea (adult) (pediatric): Secondary | ICD-10-CM

## 2016-05-19 DIAGNOSIS — M79641 Pain in right hand: Secondary | ICD-10-CM | POA: Diagnosis not present

## 2016-05-19 NOTE — Telephone Encounter (Signed)
CPAP titration 05/15/16 >> CPAP 10 cm H2O >> AHI 1.2.  Did not need supplemental oxygen.   Will have my nurse inform pt that she did well with CPAP during the sleep study, and she did not need supplemental oxygen at night when she was using CPAP.  Please arrange for CPAP set up with pressure 10 cm H2O and heated humidity with mask of choice.  Please arrange for overnight oximetry while using CPAP.  Please arrange for ROV in 2 months.

## 2016-05-19 NOTE — Procedures (Signed)
     Patient Name: Mary [name], Mary [name] Date: 05/15/2016   Gender: Female  D.O.B: 18-May-1955  Age (years): 7  Referring Provider: Chesley Mires MD, ABSM  Height (inches): 63  Interpreting Physician: Chesley Mires MD, ABSM  Weight (lbs): 235  RPSGT: Baxter Flattery   BMI: 42  MRN:   Neck Size: 16.00    CLINICAL INFORMATION  The patient is referred for a CPAP titration to treat sleep apnea. Date of NPSG: 08/17/15, AHI 6.1.  SLEEP STUDY TECHNIQUE  As per the AASM Manual for the Scoring of Sleep and Associated Events v2.3 (April 2016) with a hypopnea requiring 4% desaturations.  The channels recorded and monitored were frontal, central and occipital EEG, electrooculogram (EOG), submentalis EMG (chin), nasal and oral airflow, thoracic and abdominal wall motion, anterior tibialis EMG, snore microphone, electrocardiogram, and pulse oximetry. Continuous positive airway pressure (CPAP) was initiated at the beginning of the study and titrated to treat sleep-disordered breathing. MEDICATIONS  Medications self-administered by patient taken the night of the study : BENADRYL  TECHNICIAN COMMENTS  Comments added by technician: Patient had difficulty initiating sleep.  Comments added by scorer: N/A  RESPIRATORY PARAMETERS  Optimal PAP Pressure (cm): 10 AHI at Optimal Pressure (/hr): 1.2  Overall Minimal O2 (%): 83.00 Supine % at Optimal Pressure (%): 0  Minimal O2 at Optimal Pressure (%): 85.0      Once she achieved adequate control of sleep apnea with CPAP she did not require the use of supplemental oxygen.  SLEEP ARCHITECTURE  The study was initiated at 9:57:08 PM and ended at 4:24:10 AM.  Sleep onset time was 15.0 minutes and the sleep efficiency was 84.7%. The total sleep time was 328.0 minutes.  The patient spent 9.30% of the night in stage N1 sleep, 72.41% in stage N2 sleep, 0.00% in stage N3 and 18.29% in REM.Stage REM latency was 211.0 minutes  Wake after sleep onset was 44.0. Alpha  intrusion was absent. Supine sleep was 0.91%. CARDIAC DATA  The 2 lead EKG demonstrated sinus rhythm. The mean heart rate was 67.65 beats per minute. Other EKG findings include: None.  LEG MOVEMENT DATA  The total Periodic Limb Movements of Sleep (PLMS) were 1. The PLMS index was 0.18. A PLMS index of <15 is considered normal in adults. IMPRESSIONS  - The optimal PAP pressure was 10 cm of water. DIAGNOSIS  - Obstructive Sleep Apnea (327.23 [G47.33 ICD-10]) RECOMMENDATIONS  - Trial of CPAP therapy on 10 cm H2O.  - She was fitted with a Small size Resmed Full Face Mask AirFit F20 mask and heated humidification. [Electronically signed] 05/19/2016 11:48 PM Chesley Mires MD, [place], American Board of Sleep Medicine  NPI: [id number]

## 2016-05-20 DIAGNOSIS — M7541 Impingement syndrome of right shoulder: Secondary | ICD-10-CM | POA: Diagnosis not present

## 2016-05-22 NOTE — Telephone Encounter (Signed)
ATC pt. Left message for pt to call back.

## 2016-05-22 NOTE — Telephone Encounter (Signed)
Patient returned phone call.Mary [name] ° °

## 2016-05-23 NOTE — Telephone Encounter (Signed)
Pt schedule for July 18th, 2018 at 12pm with VS Order placed for CPAP machine.  Nothing further needed.

## 2016-05-28 ENCOUNTER — Telehealth: Payer: Self-pay | Admitting: Pulmonary Disease

## 2016-05-28 DIAGNOSIS — G4733 Obstructive sleep apnea (adult) (pediatric): Secondary | ICD-10-CM

## 2016-05-28 NOTE — Telephone Encounter (Signed)
She had diagnostic sleep study on 08/17/15 which showed mild sleep apnea.  She was tried on supplemental oxygen and weight loss.  ROV note from 04/08/16 clearly states that she was still having symptoms related to sleep apnea, and that she was to have further assessment of therapy for sleep apnea with a CPAP titration study.  If this information is not sufficient, then please explain to patient that her insurance company requires an additional sleep study prior to approving CPAP.  Please check if she can have a home sleep study.

## 2016-05-28 NOTE — Telephone Encounter (Signed)
Spoke with Mary [name] at [facility], who states pt has [employer] who is now going by medicare guidelines. Mary [name] states pt will need a diagnosis sleep study prior to having a titration. I explained to [facility] that it appears that pt had a split night on 08/21/15. Mary [name] states this test will not work due to not having a OV note state the need for sleep study prior to this study.   VS please advise. Thanks.

## 2016-05-29 NOTE — Telephone Encounter (Signed)
lmom tcb x1 to [facility]

## 2016-05-29 NOTE — Telephone Encounter (Signed)
lmomtcb x1 

## 2016-05-29 NOTE — Telephone Encounter (Signed)
Pt has [place] [facility] she can do a [place] if that is what she wants to do no precert is required Joellen [place]

## 2016-05-29 NOTE — Telephone Encounter (Signed)
Spoke with Melissa and she stated to let her know once the testing has been done for this pt.    [facility] please advise if we can do the home sleep test for this pt or will she need to be in the lab per her insurance.  Thanks

## 2016-05-30 NOTE — Telephone Encounter (Signed)
Patient returned phone call, patient states it will be ok to do a home sleep study..patient contact [phone]...ert

## 2016-05-30 NOTE — Telephone Encounter (Signed)
  lmomtcb x 2 for the pt to make sure she is ok with having the home sleep study scheduled for her.

## 2016-05-30 NOTE — Telephone Encounter (Signed)
Spoke with pt and she is aware that the order is placed. She had no further questions at this time. Nothing further is needed

## 2016-06-11 DIAGNOSIS — R0902 Hypoxemia: Secondary | ICD-10-CM | POA: Diagnosis not present

## 2016-06-11 DIAGNOSIS — G4733 Obstructive sleep apnea (adult) (pediatric): Secondary | ICD-10-CM | POA: Diagnosis not present

## 2016-06-11 DIAGNOSIS — G4734 Idiopathic sleep related nonobstructive alveolar hypoventilation: Secondary | ICD-10-CM | POA: Diagnosis not present

## 2016-06-12 DIAGNOSIS — G4733 Obstructive sleep apnea (adult) (pediatric): Secondary | ICD-10-CM | POA: Diagnosis not present

## 2016-06-25 ENCOUNTER — Telehealth: Payer: Self-pay | Admitting: Pulmonary Disease

## 2016-06-25 DIAGNOSIS — G4733 Obstructive sleep apnea (adult) (pediatric): Secondary | ICD-10-CM

## 2016-06-25 NOTE — Telephone Encounter (Signed)
HST 06/12/16 >> AHI 17.2, SaO2 low 82%   Will have my nurse inform pt that sleep study shows moderate sleep apnea.  Please arrange for CPAP 10 cm H2O.  Please schedule ROV with me or NP 2 months after getting CPAP set up.

## 2016-06-26 DIAGNOSIS — G4733 Obstructive sleep apnea (adult) (pediatric): Secondary | ICD-10-CM | POA: Diagnosis not present

## 2016-06-27 ENCOUNTER — Other Ambulatory Visit: Payer: Self-pay | Admitting: *Deleted

## 2016-06-27 DIAGNOSIS — G4733 Obstructive sleep apnea (adult) (pediatric): Secondary | ICD-10-CM

## 2016-06-30 NOTE — Telephone Encounter (Signed)
Spoke with patient and informed her of results. Pt was made aware of CPAP order being placed, someone contacting her in regards to CPAP set up, and to contact office once it was received to schedule ROV. Pt verbalized understanding and did not have any questions. Order placed for CPAP machine. Nothing further is needed.

## 2016-07-03 MED FILL — DULoxetine HCL 60 MG CPEP: 60 | 90 days supply | Qty: 90 | Fill #0

## 2016-07-07 ENCOUNTER — Other Ambulatory Visit: Payer: Self-pay | Admitting: Family Medicine

## 2016-07-07 DIAGNOSIS — Z1231 Encounter for screening mammogram for malignant neoplasm of breast: Secondary | ICD-10-CM

## 2016-07-09 DIAGNOSIS — G4733 Obstructive sleep apnea (adult) (pediatric): Secondary | ICD-10-CM | POA: Diagnosis not present

## 2016-07-09 DIAGNOSIS — R0902 Hypoxemia: Secondary | ICD-10-CM | POA: Diagnosis not present

## 2016-07-09 DIAGNOSIS — G4734 Idiopathic sleep related nonobstructive alveolar hypoventilation: Secondary | ICD-10-CM | POA: Diagnosis not present

## 2016-08-06 ENCOUNTER — Ambulatory Visit: Payer: Self-pay | Admitting: Pulmonary Disease

## 2016-08-08 DIAGNOSIS — G4734 Idiopathic sleep related nonobstructive alveolar hypoventilation: Secondary | ICD-10-CM | POA: Diagnosis not present

## 2016-08-08 DIAGNOSIS — G4733 Obstructive sleep apnea (adult) (pediatric): Secondary | ICD-10-CM | POA: Diagnosis not present

## 2016-08-08 DIAGNOSIS — R0902 Hypoxemia: Secondary | ICD-10-CM | POA: Diagnosis not present

## 2016-08-20 ENCOUNTER — Encounter: Payer: Self-pay | Admitting: Acute Care

## 2016-08-20 ENCOUNTER — Ambulatory Visit (INDEPENDENT_AMBULATORY_CARE_PROVIDER_SITE_OTHER): Payer: 59 | Admitting: Acute Care

## 2016-08-20 VITALS — BP 116/78 | HR 71 | Ht 63.0 in | Wt 246.4 lb

## 2016-08-20 DIAGNOSIS — G4733 Obstructive sleep apnea (adult) (pediatric): Secondary | ICD-10-CM

## 2016-08-20 NOTE — Assessment & Plan Note (Signed)
Compliant with CPAP AHI 4.2 on CPAP set pressure 10 cm water Patient states she thinks her mask may be leaking, or that she is wearing too tight Plan We will schedule you for a mask fitting with Lynnae Sandhoff. We will schedule you for an over night oximetry today. Follow up in 2 months with down Load  Continue on CPAP at bedtime. You appear to be benefiting from the treatment Goal is to wear for at least 6 hours each night for maximal clinical benefit. Continue to work on weight loss, as the link between excess weight  and sleep apnea is well established.  Do not drive if sleepy. Clean your machine with your So Clean machine as you have been doing. Follow up with NP In 2 months  or before as needed. Please contact office for sooner follow up if symptoms do not improve or worsen or seek emergency care

## 2016-08-20 NOTE — Patient Instructions (Addendum)
It is nice to meet you today. We will schedule you for a mask fitting with Lynnae Sandhoff. We will schedule you for an over night oximetry today. Follow up in 2 months with down Load  Continue on CPAP at bedtime. You appear to be benefiting from the treatment Goal is to wear for at least 6 hours each night for maximal clinical benefit. Continue to work on weight loss, as the link between excess weight  and sleep apnea is well established.  Do not drive if sleepy. Clean your machine with your So Clean machine as you have been doing. Follow up with NP In 2 months  or before as needed. Please contact office for sooner follow up if symptoms do not improve or worsen or seek emergency care

## 2016-08-20 NOTE — Progress Notes (Signed)
History of Present Illness Mary [name] is a 61 y.o. female with OSA on CPAP. She is followed by Dr Halford Chessman.  08/20/2016 Follow Up CPAP: Pt. Follows up for CPAP. She is doing well on her CPAP. She is compliant 100% of the time. She states she is awakening more rested on the CPAP, but states that she has had 2 episodes of awakening with the sensation she was choking. She states this is how she felt prior to going on nocturnal oxygen.She feels this is due to desaturations. .She is requesting a repeat overnight O&O while on CPAP to evaluate for continued nocturnal hypoxemia. She denies any issues or problems with her CPAP device or equipment. She does wonder if she is wearing her face mast too tightly, as she has had to wake up to adjusted through the night. She denies fever ,Chest pain, orthopnea or hemoptysis.   Test Results: 08/20/2016  Download 07/20/2016 through 08/18/2016 Air sense 10 auto set set pressure 10 cm H2O Usage days 30 of 30 or 100% Greater than 4 hours 30 days 100% Usage hours average 7 hours and 41 minutes daily Set pressure 10 cm water AHI 4.2  HST 06/12/16 >> AHI 17.2, SaO2 low 82%  CBC Latest Ref Rng & Units 12/03/2015 03/02/2014 03/31/2013  WBC 3.8 - 10.8 K/uL 7.4 7.8 12.9(H)  Hemoglobin 11.7 - 15.5 g/dL 12.5 13.2 11.5(L)  Hematocrit 35.0 - 45.0 % 41.0 41.7 35.3(L)  Platelets 140 - 400 K/uL 372 348 275    BMP Latest Ref Rng & Units 12/03/2015 03/02/2014 07/16/2010  Glucose 65 - 99 mg/dL 92 138(H) 141(H)  BUN 7 - 25 mg/dL 13 12 9   Creatinine 0.50 - 0.99 mg/dL 0.78 0.76 0.86  Sodium 135 - 146 mmol/L 139 138 138  Potassium 3.5 - 5.3 mmol/L 4.1 3.7 3.7  Chloride 98 - 110 mmol/L 103 102 102  CO2 20 - 31 mmol/L 27 25 28   Calcium 8.6 - 10.4 mg/dL 9.2 9.0 9.5     Past medical hx Past Medical History:  Diagnosis Date  . Anaphylaxis    Chicory root, shellfish  . Arthritis    hands, knees, neck - no meds  . Celiac disease   . Depression   . Fibromyalgia   . GERD  (gastroesophageal reflux disease)    no meds  . Morbid obesity ([place]) 10/19/2015  . PONV (postoperative nausea and vomiting)   . SVD (spontaneous vaginal delivery)    x 2  . Systolic murmur 3/41/9622     Social History  Substance Use Topics  . Smoking status: Never Smoker  . Smokeless tobacco: Never Used  . Alcohol use Yes     Comment: occasionally    Ms.Schwarzkopf reports that she has never smoked. She has never used smokeless tobacco. She reports that she drinks alcohol. She reports that she does not use drugs.  Tobacco Cessation: Patient is a never smoker  Past surgical hx, Family hx, Social hx all reviewed.  Current Outpatient Prescriptions on File Prior to Visit  Medication Sig  . acetaminophen (TYLENOL) 500 MG tablet Take 1,000 mg by mouth daily as needed for headache.  . diclofenac sodium (VOLTAREN) 1 % GEL Apply 2 g topically daily as needed (for pain).  . DULoxetine (CYMBALTA) 60 MG capsule Take 60 mg by mouth at bedtime.   Marland Kitchen EPINEPHrine 0.3 mg/0.3 mL IJ SOAJ injection Inject 0.3 mg into the muscle once as needed (allergic reaction).   . Multiple Vitamin (MULTIVITAMIN WITH MINERALS)  TABS tablet Take 1 tablet by mouth daily.  . nitroGLYCERIN (NITROSTAT) 0.4 MG SL tablet Place 0.4 mg under the tongue every 5 (five) minutes as needed for chest pain.  . Omega-3 Fatty Acids (FISH OIL PO) Take 1 tablet by mouth daily.   No current facility-administered medications on file prior to visit.      Allergies  Allergen Reactions  . Aspirin Anaphylaxis and Swelling  . 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 Scratch throat. cough  . Nsaids Anaphylaxis and Swelling  . Other Anaphylaxis, Itching and Cough    Pt ate a fruit bar that may have had something that is causing this allergic reaction.     . Shellfish Allergy Anaphylaxis and Swelling  . Celebrex [Celecoxib] Swelling    Facial swelling    Review Of  Systems:  Constitutional:   No  weight loss, night sweats,  Fevers, chills, fatigue, or  lassitude.  HEENT:   No headaches,  Difficulty swallowing,  Tooth/dental problems, or  Sore throat,                No sneezing, itching, ear ache, nasal congestion, post nasal drip,   CV:  No chest pain,  Orthopnea, PND, swelling in lower extremities, anasarca, dizziness, palpitations, syncope.   GI  No heartburn, indigestion, abdominal pain, nausea, vomiting, diarrhea, change in bowel habits, loss of appetite, bloody stools.   Resp: No shortness of breath with exertion or at rest.  No excess mucus, no productive cough,  No non-productive cough,  No coughing up of blood.  No change in color of mucus.  No wheezing.  No chest wall deformity  Skin: no rash or lesions.  GU: no dysuria, change in color of urine, no urgency or frequency.  No flank pain, no hematuria   MS:  No joint pain or swelling.  No decreased range of motion.  No back pain.  Psych:  No change in mood or affect. No depression or anxiety.  No memory loss.   Vital Signs BP 116/78 (BP Location: Left Arm, Patient Position: Sitting, Cuff Size: Normal)   Pulse 71   Ht 5\' 3"  (1.6 m)   Wt 246 lb 6.4 oz (111.8 kg)   SpO2 96%   BMI 43.65 kg/m    Physical Exam:  General- No distress,  A&Ox3, pleasant, obese ENT: No sinus tenderness, TM clear, pale nasal mucosa, no oral exudate,no post nasal drip, no LAN Cardiac: S1, S2, regular rate and rhythm, no murmur Chest: No wheeze/ rales/ dullness; no accessory muscle use, no nasal flaring, no sternal retractions, diminished per bases bilaterally Abd.: Soft Non-tender, nondistended, bowel sounds positive Ext: No clubbing cyanosis, edema Neuro:  normal strength, cranial nerves intact Skin: No rashes, warm and dry Psych: normal mood and behavior   Assessment/Plan  OSA (obstructive sleep apnea) Compliant with CPAP AHI 4.2 on CPAP set pressure 10 cm water Patient states she thinks her mask  may be leaking, or that she is wearing too tight Plan We will schedule you for a mask fitting with Lynnae Sandhoff. We will schedule you for an over night oximetry today. Follow up in 2 months with down Load  Continue on CPAP at bedtime. You appear to be benefiting from the treatment Goal is to wear for at least 6 hours each night for maximal clinical benefit. Continue to work on weight loss, as the link between excess weight  and sleep apnea is  well established.  Do not drive if sleepy. Clean your machine with your So Clean machine as you have been doing. Follow up with NP In 2 months  or before as needed. Please contact office for sooner follow up if symptoms do not improve or worsen or seek emergency care       Magdalen Spatz, NP 08/20/2016  10:43 PM

## 2016-08-21 ENCOUNTER — Ambulatory Visit
Admission: RE | Admit: 2016-08-21 | Discharge: 2016-08-21 | Disposition: A | Discharge status: Home / Self Care | Payer: 59 | Source: Ambulatory Visit | Attending: Family Medicine | Admitting: Family Medicine

## 2016-08-21 DIAGNOSIS — Z1231 Encounter for screening mammogram for malignant neoplasm of breast: Secondary | ICD-10-CM

## 2016-08-21 NOTE — Progress Notes (Signed)
I have reviewed and agree with assessment/plan.  Titianna Loomis, MD [facility] 08/21/2016, 2:32 PM Pager:  [phone]  

## 2016-08-22 ENCOUNTER — Telehealth: Payer: Self-pay | Admitting: Pulmonary Disease

## 2016-08-22 DIAGNOSIS — G4733 Obstructive sleep apnea (adult) (pediatric): Secondary | ICD-10-CM

## 2016-08-22 NOTE — Telephone Encounter (Signed)
Attempted to contact [name] at [facility]. No answer and I could not leave a message. Will try back. 

## 2016-08-22 NOTE — Telephone Encounter (Signed)
SG please advise about the ONO that you wanted the pt to have.  Is this on room air, you want this done at home or in the lab?  Please advise. Thanks

## 2016-08-22 NOTE — Telephone Encounter (Signed)
ONO on room air while wearing her CPAP device. She does not currently wear oxygen at night. This is to try and qualify her for oxygen  while on CPAP. Home study is fine as long as that meets requirements per her insurance.Thanks

## 2016-08-25 NOTE — Telephone Encounter (Signed)
lmtcb x 1 for [name] with  [facility] 

## 2016-08-26 NOTE — Telephone Encounter (Signed)
lmtcb x2 for [name] with [facility]. 

## 2016-08-26 NOTE — Telephone Encounter (Signed)
Per Corene Cornea, ONO order needs to be placed stating ONO on CPAP and Room Air.  Order placed. Nothing further needed.

## 2016-08-26 NOTE — Telephone Encounter (Signed)
Mary [name] returning call.Hillery Hunter

## 2016-08-27 ENCOUNTER — Ambulatory Visit (HOSPITAL_BASED_OUTPATIENT_CLINIC_OR_DEPARTMENT_OTHER): Payer: 59 | Attending: Acute Care | Admitting: Radiology

## 2016-08-27 DIAGNOSIS — G4733 Obstructive sleep apnea (adult) (pediatric): Secondary | ICD-10-CM

## 2016-08-27 DIAGNOSIS — H524 Presbyopia: Secondary | ICD-10-CM | POA: Diagnosis not present

## 2016-09-02 DIAGNOSIS — R0902 Hypoxemia: Secondary | ICD-10-CM | POA: Diagnosis not present

## 2016-09-02 DIAGNOSIS — J449 Chronic obstructive pulmonary disease, unspecified: Secondary | ICD-10-CM | POA: Diagnosis not present

## 2016-09-05 MED FILL — AMOXICILLIN 500 MG CAPSULE: 500 | 2 days supply | Qty: 8 | Fill #1

## 2016-09-08 DIAGNOSIS — G4733 Obstructive sleep apnea (adult) (pediatric): Secondary | ICD-10-CM | POA: Diagnosis not present

## 2016-09-08 DIAGNOSIS — R0902 Hypoxemia: Secondary | ICD-10-CM | POA: Diagnosis not present

## 2016-09-08 DIAGNOSIS — G4734 Idiopathic sleep related nonobstructive alveolar hypoventilation: Secondary | ICD-10-CM | POA: Diagnosis not present

## 2016-09-09 ENCOUNTER — Telehealth: Payer: Self-pay | Admitting: Acute Care

## 2016-09-09 DIAGNOSIS — G4734 Idiopathic sleep related nonobstructive alveolar hypoventilation: Secondary | ICD-10-CM

## 2016-09-09 NOTE — Telephone Encounter (Signed)
Per sleep study/overnight oximetry patient qualifies for nocturnal oxygen. She had desaturations of less than 88% for 56 minutes and 16 seconds. Please place order for nocturnal oxygen for Mary [name] date of birth 08/25/1955 thank you

## 2016-09-10 ENCOUNTER — Telehealth: Payer: Self-pay | Admitting: Acute Care

## 2016-09-10 NOTE — Telephone Encounter (Signed)
DUPILICATE ENCOUNTER.  See 8/21 phone note.

## 2016-09-10 NOTE — Telephone Encounter (Signed)
lmtcb x1 for pt. 

## 2016-09-10 NOTE — Telephone Encounter (Signed)
Spoke with pt, aware of results/recs.  Supplemental O2 ordered.  Nothing further needed.

## 2016-09-16 DIAGNOSIS — R0902 Hypoxemia: Secondary | ICD-10-CM | POA: Diagnosis not present

## 2016-09-16 DIAGNOSIS — G4733 Obstructive sleep apnea (adult) (pediatric): Secondary | ICD-10-CM | POA: Diagnosis not present

## 2016-09-16 DIAGNOSIS — G4734 Idiopathic sleep related nonobstructive alveolar hypoventilation: Secondary | ICD-10-CM | POA: Diagnosis not present

## 2016-09-26 DIAGNOSIS — N61 Mastitis without abscess: Secondary | ICD-10-CM | POA: Diagnosis not present

## 2016-09-26 DIAGNOSIS — T63461A Toxic effect of venom of wasps, accidental (unintentional), initial encounter: Secondary | ICD-10-CM | POA: Diagnosis not present

## 2016-09-26 MED FILL — CEPHALEXIN 500 MG CAPSULE: 500 | 7 days supply | Qty: 14 | Fill #0

## 2016-10-01 DIAGNOSIS — M19041 Primary osteoarthritis, right hand: Secondary | ICD-10-CM | POA: Diagnosis not present

## 2016-10-01 DIAGNOSIS — M79644 Pain in right finger(s): Secondary | ICD-10-CM | POA: Diagnosis not present

## 2016-10-02 ENCOUNTER — Ambulatory Visit
Admission: RE | Admit: 2016-10-02 | Discharge: 2016-10-02 | Disposition: A | Discharge status: Home / Self Care | Payer: 59 | Source: Ambulatory Visit | Attending: Family Medicine | Admitting: Family Medicine

## 2016-10-02 ENCOUNTER — Other Ambulatory Visit: Payer: Self-pay | Admitting: Family Medicine

## 2016-10-02 DIAGNOSIS — M25511 Pain in right shoulder: Secondary | ICD-10-CM | POA: Diagnosis not present

## 2016-10-02 DIAGNOSIS — S42001A Fracture of unspecified part of right clavicle, initial encounter for closed fracture: Secondary | ICD-10-CM | POA: Diagnosis not present

## 2016-10-07 MED FILL — DULoxetine HCL 60 MG CPEP: 60 | 90 days supply | Qty: 90 | Fill #1

## 2016-10-08 ENCOUNTER — Other Ambulatory Visit: Payer: Self-pay | Admitting: Family Medicine

## 2016-10-08 DIAGNOSIS — M25511 Pain in right shoulder: Secondary | ICD-10-CM

## 2016-10-09 DIAGNOSIS — G4733 Obstructive sleep apnea (adult) (pediatric): Secondary | ICD-10-CM | POA: Diagnosis not present

## 2016-10-09 DIAGNOSIS — G4734 Idiopathic sleep related nonobstructive alveolar hypoventilation: Secondary | ICD-10-CM | POA: Diagnosis not present

## 2016-10-09 DIAGNOSIS — R0902 Hypoxemia: Secondary | ICD-10-CM | POA: Diagnosis not present

## 2016-10-10 DIAGNOSIS — G4733 Obstructive sleep apnea (adult) (pediatric): Secondary | ICD-10-CM | POA: Diagnosis not present

## 2016-10-10 DIAGNOSIS — R0902 Hypoxemia: Secondary | ICD-10-CM | POA: Diagnosis not present

## 2016-10-10 DIAGNOSIS — G4734 Idiopathic sleep related nonobstructive alveolar hypoventilation: Secondary | ICD-10-CM | POA: Diagnosis not present

## 2016-10-14 ENCOUNTER — Ambulatory Visit
Admission: RE | Admit: 2016-10-14 | Discharge: 2016-10-14 | Disposition: A | Discharge status: Home / Self Care | Payer: 59 | Source: Ambulatory Visit | Attending: Family Medicine | Admitting: Family Medicine

## 2016-10-14 DIAGNOSIS — M25511 Pain in right shoulder: Secondary | ICD-10-CM

## 2016-10-14 DIAGNOSIS — M542 Cervicalgia: Secondary | ICD-10-CM | POA: Diagnosis not present

## 2016-10-17 DIAGNOSIS — R0902 Hypoxemia: Secondary | ICD-10-CM | POA: Diagnosis not present

## 2016-10-17 DIAGNOSIS — G4734 Idiopathic sleep related nonobstructive alveolar hypoventilation: Secondary | ICD-10-CM | POA: Diagnosis not present

## 2016-10-17 DIAGNOSIS — G4733 Obstructive sleep apnea (adult) (pediatric): Secondary | ICD-10-CM | POA: Diagnosis not present

## 2016-10-22 ENCOUNTER — Encounter: Payer: Self-pay | Admitting: Acute Care

## 2016-10-22 ENCOUNTER — Ambulatory Visit (INDEPENDENT_AMBULATORY_CARE_PROVIDER_SITE_OTHER): Payer: 59 | Admitting: Acute Care

## 2016-10-22 VITALS — BP 138/90 | HR 80

## 2016-10-22 DIAGNOSIS — G4733 Obstructive sleep apnea (adult) (pediatric): Secondary | ICD-10-CM | POA: Diagnosis not present

## 2016-10-22 NOTE — Patient Instructions (Addendum)
It is good to see you today We will repeat O&O test to ensure that 2 L [place] at night is maintaining your oxygen levels. Continue on CPAP at bedtime. You appear to be benefiting from the treatment Goal is to wear for at least 6 hours each night for maximal clinical benefit. Continue to work on weight loss, as the link between excess weight  and sleep apnea is well established.  Do not drive if sleepy. Remember to clean mask, tubing, filter, and reservoir once weekly with soapy water.  Follow up with 6 months with Dr. Halford Chessman  or before as needed.  Please contact office for sooner follow up if symptoms do not improve or worsen or seek emergency care

## 2016-10-22 NOTE — Assessment & Plan Note (Signed)
Compliant with CPAP therapy Plan: We will repeat O&O test to ensure that 2 L [place] at night is maintaining your oxygen levels. Continue on CPAP at bedtime. You appear to be benefiting from the treatment Goal is to wear for at least 6 hours each night for maximal clinical benefit. Continue to work on weight loss, as the link between excess weight  and sleep apnea is well established.  Do not drive if sleepy. Remember to clean mask, tubing, filter, and reservoir once weekly with soapy water.  Follow up with 6 months with Dr. Halford Chessman  or before as needed.  Please contact office for sooner follow up if symptoms do not improve or worsen or seek emergency care

## 2016-10-22 NOTE — Progress Notes (Signed)
History of Present Illness Mary [name] is a 61 y.o. female never smoker with OSA on CPAP therapy. She is followed by Dr. Halford Chessman.   10/22/2016 2 Month follow up for mask re-check with Down Load: Pt. Presents for follow up. She is compliant with her CPAP. She had a mask fitting since last check and she feels her new mask fits better. She has started oxygen at 2 L with her CPAP due to nocturnal desaturations. She wants reassurance that with the oxygen her saturations levels are > 88%.She denies chest pain, fever, orthopnea, hemoptysis. She states she is sleeping better, awakens more rested and is more focused during the day.We discussed the role of good sleep hygiene and bedtime routine.   Test Results: Down Load:09/22/2016-10/21/2016 Air Sense Auto Set 10 cm H2O Usage 27/30 days ( 90%) > 4 hours = 90% < 4 hours = 0 days Average Usage hours : 7 hours 45 minutes AHI= 3.1  CBC Latest Ref Rng & Units 12/03/2015 03/02/2014 03/31/2013  WBC 3.8 - 10.8 K/uL 7.4 7.8 12.9(H)  Hemoglobin 11.7 - 15.5 g/dL 12.5 13.2 11.5(L)  Hematocrit 35.0 - 45.0 % 41.0 41.7 35.3(L)  Platelets 140 - 400 K/uL 372 348 275    BMP Latest Ref Rng & Units 12/03/2015 03/02/2014 07/16/2010  Glucose 65 - 99 mg/dL 92 138(H) 141(H)  BUN 7 - 25 mg/dL 13 12 9   Creatinine 0.50 - 0.99 mg/dL 0.78 0.76 0.86  Sodium 135 - 146 mmol/L 139 138 138  Potassium 3.5 - 5.3 mmol/L 4.1 3.7 3.7  Chloride 98 - 110 mmol/L 103 102 102  CO2 20 - 31 mmol/L 27 25 28   Calcium 8.6 - 10.4 mg/dL 9.2 9.0 9.5    Dg Clavicle Right  Result Date: 10/02/2016 CLINICAL DATA:  Sudden appearance of subQ nodule at RIGHT [place] jt. X 3 days / BB marker over apex of STS / concern for etiology of arthralgia EXAM: RIGHT CLAVICLE - 2+ VIEWS COMPARISON:  Chest radiograph, 12/03/2015 FINDINGS: No osteoblastic or osteolytic bone lesion. [facility] and sternoclavicular joints are normally aligned. There is a chronic fracture of the distal right clavicle, new since the prior  radiographs, which appears ununited. Fracture margins demonstrate mild sclerosis and marginal spurring. No soft tissue mass. IMPRESSION: 1. No bone lesion.  No dislocation. 2. 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 3. No visible soft tissue mass. Further evaluation for a possible soft tissue mass would be best performed with either MRI with and without contrast or CT. Electronically Signed   By: Lajean Manes M.D.   On: 10/02/2016 14:06   US Soft Tissue Head & Neck (non-thyroid)  Result Date: 10/15/2016 CLINICAL DATA:  61 year old female with pain at the right acromioclavicular joint EXAM: ULTRASOUND OF HEAD/NECK SOFT TISSUES TECHNIQUE: Ultrasound examination of the head and neck soft tissues was performed in the area of clinical concern. COMPARISON:  None. FINDINGS: Ultrasound survey performed in the region of clinical concern with grayscale and color duplex imaging. There is heterogeneously hypoechoic structure projecting anteriorly from the right acromioclavicular joint region extending towards the joint, measuring 2.4 cm x 0.8 cm x 1.9 cm. No internal flow with well-defined border. IMPRESSION: Heterogeneously hypoechoic structure associated with the right acromioclavicular joint, most likely a cyst related to the joint. Sterility cannot be determined by imaging. Electronically Signed   By: Corrie Mckusick D.O.   On: 10/15/2016 12:58     Past medical hx  Past Medical History:  Diagnosis Date  . Anaphylaxis    Chicory root, shellfish  . Arthritis    hands, knees, neck - no meds  . Celiac disease   . Depression   . Fibromyalgia   . GERD (gastroesophageal reflux disease)    no meds  . Morbid obesity ([place]) 10/19/2015  . PONV (postoperative nausea and vomiting)   . SVD (spontaneous vaginal delivery)    x 2  . Systolic murmur 0/99/8338     Social History  Substance Use Topics  . Smoking status: Never Smoker  . Smokeless  tobacco: Never Used  . Alcohol use Yes     Comment: occasionally    Ms.Feider reports that she has never smoked. She has never used smokeless tobacco. She reports that she drinks alcohol. She reports that she does not use drugs.  Tobacco Cessation: Never smoker  Past surgical hx, Family hx, Social hx all reviewed.  Current Outpatient Prescriptions on File Prior to Visit  Medication Sig  . acetaminophen (TYLENOL) 500 MG tablet Take 1,000 mg by mouth daily as needed for headache.  . diclofenac sodium (VOLTAREN) 1 % GEL Apply 2 g topically daily as needed (for pain).  . DULoxetine (CYMBALTA) 60 MG capsule Take 60 mg by mouth at bedtime.   Marland Kitchen EPINEPHrine 0.3 mg/0.3 mL IJ SOAJ injection Inject 0.3 mg into the muscle once as needed (allergic reaction).   . Multiple Vitamin (MULTIVITAMIN WITH MINERALS) TABS tablet Take 1 tablet by mouth daily.  . nitroGLYCERIN (NITROSTAT) 0.4 MG SL tablet Place 0.4 mg under the tongue every 5 (five) minutes as needed for chest pain.  . Omega-3 Fatty Acids (FISH OIL PO) Take 1 tablet by mouth daily.   No current facility-administered medications on file prior to visit.      Allergies  Allergen Reactions  . Aspirin Anaphylaxis and Swelling  . 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 Scratch throat. cough  . Nsaids Anaphylaxis and Swelling  . Other Anaphylaxis, Itching and Cough    Pt ate a fruit bar that may have had something that is causing this allergic reaction.     . Shellfish Allergy Anaphylaxis and Swelling  . Celebrex [Celecoxib] Swelling    Facial swelling    Review Of Systems:  Constitutional:   No  weight loss, night sweats,  Fevers, chills, fatigue, or  lassitude.  HEENT:   No headaches,  Difficulty swallowing,  Tooth/dental problems, or  Sore throat,                No sneezing, itching, ear ache, nasal congestion, post nasal drip,   CV:  No chest pain,  Orthopnea, PND,  swelling in lower extremities, anasarca, dizziness, palpitations, syncope.   GI  No heartburn, indigestion, abdominal pain, nausea, vomiting, diarrhea, change in bowel habits, loss of appetite, bloody stools.   Resp: No shortness of breath with exertion or at rest.  No excess mucus, no productive cough,  No non-productive cough,  No coughing up of blood.  No change in color of mucus.  No wheezing.  No chest wall deformity  Skin: no rash or lesions.  GU: no dysuria, change in color of urine, no urgency or frequency.  No flank pain, no hematuria   MS:  No joint pain or swelling.  No decreased range of motion.  No back pain.  Psych:  No change in mood or affect. No depression or anxiety.  No memory loss.   Vital Signs BP 138/90 (BP Location: Left Arm, Cuff Size: Large)   Pulse 80   SpO2 98%    Physical Exam:  General- No distress,  A&Ox3, pleasant ENT: No sinus tenderness, TM clear, pale nasal mucosa, no oral exudate,no post nasal drip, no LAN Cardiac: S1, S2, regular rate and rhythm, no murmur Chest: No wheeze/ rales/ dullness; no accessory muscle use, no nasal flaring, no sternal retractions Abd.: Soft Non-tender, obese, bowel sounds positive Ext: No clubbing cyanosis, edema Neuro:  normal strength, cranial nerves intact, appropriate Skin: No rashes, warm and dry Psych: normal mood and behavior   Assessment/Plan  OSA (obstructive sleep apnea) Compliant with CPAP therapy Plan: We will repeat O&O test to ensure that 2 L [place] at night is maintaining your oxygen levels. Continue on CPAP at bedtime. You appear to be benefiting from the treatment Goal is to wear for at least 6 hours each night for maximal clinical benefit. Continue to work on weight loss, as the link between excess weight  and sleep apnea is well established.  Do not drive if sleepy. Remember to clean mask, tubing, filter, and reservoir once weekly with soapy water.  Follow up with 6 months with Dr. Halford Chessman  or  before as needed.  Please contact office for sooner follow up if symptoms do not improve or worsen or seek emergency care      Magdalen Spatz, NP 10/22/2016  2:49 PM

## 2016-10-23 ENCOUNTER — Encounter: Payer: Self-pay | Admitting: Acute Care

## 2016-10-23 DIAGNOSIS — J449 Chronic obstructive pulmonary disease, unspecified: Secondary | ICD-10-CM | POA: Diagnosis not present

## 2016-10-23 DIAGNOSIS — R0902 Hypoxemia: Secondary | ICD-10-CM | POA: Diagnosis not present

## 2016-10-23 NOTE — Progress Notes (Signed)
I have reviewed and agree with assessment/plan.  Chesley Mires, MD [facility]Critical Care 10/23/2016, 9:32 AM Pager:  [phone]

## 2016-11-16 DIAGNOSIS — R0902 Hypoxemia: Secondary | ICD-10-CM | POA: Diagnosis not present

## 2016-11-16 DIAGNOSIS — G4733 Obstructive sleep apnea (adult) (pediatric): Secondary | ICD-10-CM | POA: Diagnosis not present

## 2016-11-16 DIAGNOSIS — G4734 Idiopathic sleep related nonobstructive alveolar hypoventilation: Secondary | ICD-10-CM | POA: Diagnosis not present

## 2016-11-21 DIAGNOSIS — M19041 Primary osteoarthritis, right hand: Secondary | ICD-10-CM | POA: Diagnosis not present

## 2016-11-21 MED FILL — SULFAMETHOXAZOLE/TMP DS TAB: 800-160 | 7 days supply | Qty: 14 | Fill #0

## 2016-11-21 MED FILL — oxyCODONE HCL 5 MG TABS: 5 | 4 days supply | Qty: 42 | Fill #0

## 2016-12-03 ENCOUNTER — Other Ambulatory Visit: Payer: Self-pay | Admitting: Family Medicine

## 2016-12-03 DIAGNOSIS — E559 Vitamin D deficiency, unspecified: Secondary | ICD-10-CM | POA: Diagnosis not present

## 2016-12-03 DIAGNOSIS — E785 Hyperlipidemia, unspecified: Secondary | ICD-10-CM | POA: Diagnosis not present

## 2016-12-03 DIAGNOSIS — M8588 Other specified disorders of bone density and structure, other site: Secondary | ICD-10-CM | POA: Diagnosis not present

## 2016-12-03 DIAGNOSIS — Z0000 Encounter for general adult medical examination without abnormal findings: Secondary | ICD-10-CM | POA: Diagnosis not present

## 2016-12-03 DIAGNOSIS — F322 Major depressive disorder, single episode, severe without psychotic features: Secondary | ICD-10-CM | POA: Diagnosis not present

## 2016-12-03 DIAGNOSIS — M797 Fibromyalgia: Secondary | ICD-10-CM | POA: Diagnosis not present

## 2016-12-03 DIAGNOSIS — E119 Type 2 diabetes mellitus without complications: Secondary | ICD-10-CM | POA: Diagnosis not present

## 2016-12-04 DIAGNOSIS — M19041 Primary osteoarthritis, right hand: Secondary | ICD-10-CM | POA: Diagnosis not present

## 2016-12-05 MED FILL — ATORVASTATIN 20 MG TABLET: 20 | 30 days supply | Qty: 30 | Fill #0

## 2016-12-15 DIAGNOSIS — M19041 Primary osteoarthritis, right hand: Secondary | ICD-10-CM | POA: Diagnosis not present

## 2016-12-17 DIAGNOSIS — G4734 Idiopathic sleep related nonobstructive alveolar hypoventilation: Secondary | ICD-10-CM | POA: Diagnosis not present

## 2016-12-17 DIAGNOSIS — R0902 Hypoxemia: Secondary | ICD-10-CM | POA: Diagnosis not present

## 2016-12-17 DIAGNOSIS — G4733 Obstructive sleep apnea (adult) (pediatric): Secondary | ICD-10-CM | POA: Diagnosis not present

## 2016-12-19 DIAGNOSIS — M79644 Pain in right finger(s): Secondary | ICD-10-CM | POA: Diagnosis not present

## 2016-12-30 ENCOUNTER — Ambulatory Visit
Admission: RE | Admit: 2016-12-30 | Discharge: 2016-12-30 | Disposition: A | Discharge status: Home / Self Care | Payer: 59 | Source: Ambulatory Visit | Attending: Family Medicine | Admitting: Family Medicine

## 2016-12-30 DIAGNOSIS — M8588 Other specified disorders of bone density and structure, other site: Secondary | ICD-10-CM

## 2016-12-30 DIAGNOSIS — Z780 Asymptomatic menopausal state: Secondary | ICD-10-CM | POA: Diagnosis not present

## 2016-12-30 DIAGNOSIS — Z13820 Encounter for screening for osteoporosis: Secondary | ICD-10-CM | POA: Diagnosis not present

## 2017-01-05 MED FILL — ATORVASTATIN 20 MG TABLET: 20 | 30 days supply | Qty: 30 | Fill #1

## 2017-01-05 MED FILL — DULoxetine HCL 60 MG CPEP: 60 | 90 days supply | Qty: 90 | Fill #2

## 2017-01-09 DIAGNOSIS — M79644 Pain in right finger(s): Secondary | ICD-10-CM | POA: Diagnosis not present

## 2017-01-16 DIAGNOSIS — R0902 Hypoxemia: Secondary | ICD-10-CM | POA: Diagnosis not present

## 2017-01-16 DIAGNOSIS — G4733 Obstructive sleep apnea (adult) (pediatric): Secondary | ICD-10-CM | POA: Diagnosis not present

## 2017-01-16 DIAGNOSIS — G4734 Idiopathic sleep related nonobstructive alveolar hypoventilation: Secondary | ICD-10-CM | POA: Diagnosis not present

## 2017-01-17 DIAGNOSIS — G4734 Idiopathic sleep related nonobstructive alveolar hypoventilation: Secondary | ICD-10-CM | POA: Diagnosis not present

## 2017-01-17 DIAGNOSIS — R0902 Hypoxemia: Secondary | ICD-10-CM | POA: Diagnosis not present

## 2017-01-17 DIAGNOSIS — G4733 Obstructive sleep apnea (adult) (pediatric): Secondary | ICD-10-CM | POA: Diagnosis not present

## 2017-01-30 DIAGNOSIS — M19041 Primary osteoarthritis, right hand: Secondary | ICD-10-CM | POA: Diagnosis not present

## 2017-01-30 DIAGNOSIS — Z4889 Encounter for other specified surgical aftercare: Secondary | ICD-10-CM | POA: Diagnosis not present

## 2017-01-30 DIAGNOSIS — M1990 Unspecified osteoarthritis, unspecified site: Secondary | ICD-10-CM | POA: Insufficient documentation

## 2017-01-30 DIAGNOSIS — M79641 Pain in right hand: Secondary | ICD-10-CM | POA: Insufficient documentation

## 2017-02-05 MED FILL — ATORVASTATIN 20 MG TABLET: 20 | 30 days supply | Qty: 30 | Fill #2

## 2017-02-12 DIAGNOSIS — E785 Hyperlipidemia, unspecified: Secondary | ICD-10-CM | POA: Diagnosis not present

## 2017-02-12 DIAGNOSIS — Z79899 Other long term (current) drug therapy: Secondary | ICD-10-CM | POA: Diagnosis not present

## 2017-02-16 DIAGNOSIS — G4734 Idiopathic sleep related nonobstructive alveolar hypoventilation: Secondary | ICD-10-CM | POA: Diagnosis not present

## 2017-02-16 DIAGNOSIS — G4733 Obstructive sleep apnea (adult) (pediatric): Secondary | ICD-10-CM | POA: Diagnosis not present

## 2017-02-16 DIAGNOSIS — R0902 Hypoxemia: Secondary | ICD-10-CM | POA: Diagnosis not present

## 2017-03-12 MED FILL — ATORVASTATIN 20 MG TABLET: 20 | 30 days supply | Qty: 30 | Fill #0

## 2017-03-19 DIAGNOSIS — R0902 Hypoxemia: Secondary | ICD-10-CM | POA: Diagnosis not present

## 2017-03-19 DIAGNOSIS — G4734 Idiopathic sleep related nonobstructive alveolar hypoventilation: Secondary | ICD-10-CM | POA: Diagnosis not present

## 2017-03-19 DIAGNOSIS — G4733 Obstructive sleep apnea (adult) (pediatric): Secondary | ICD-10-CM | POA: Diagnosis not present

## 2017-03-30 DIAGNOSIS — M79641 Pain in right hand: Secondary | ICD-10-CM | POA: Diagnosis not present

## 2017-03-30 DIAGNOSIS — M1990 Unspecified osteoarthritis, unspecified site: Secondary | ICD-10-CM | POA: Diagnosis not present

## 2017-03-30 DIAGNOSIS — Z4889 Encounter for other specified surgical aftercare: Secondary | ICD-10-CM | POA: Diagnosis not present

## 2017-04-14 MED FILL — DULoxetine HCL 60 MG CPEP: 60 | 90 days supply | Qty: 90 | Fill #3

## 2017-04-14 MED FILL — ATORVASTATIN 20 MG TABLET: 20 | 30 days supply | Qty: 30 | Fill #1

## 2017-04-16 DIAGNOSIS — R0902 Hypoxemia: Secondary | ICD-10-CM | POA: Diagnosis not present

## 2017-04-16 DIAGNOSIS — G4734 Idiopathic sleep related nonobstructive alveolar hypoventilation: Secondary | ICD-10-CM | POA: Diagnosis not present

## 2017-04-16 DIAGNOSIS — G4733 Obstructive sleep apnea (adult) (pediatric): Secondary | ICD-10-CM | POA: Diagnosis not present

## 2017-04-27 ENCOUNTER — Ambulatory Visit: Payer: Self-pay | Admitting: Pulmonary Disease

## 2017-05-13 DIAGNOSIS — G4733 Obstructive sleep apnea (adult) (pediatric): Secondary | ICD-10-CM | POA: Diagnosis not present

## 2017-05-13 DIAGNOSIS — R0902 Hypoxemia: Secondary | ICD-10-CM | POA: Diagnosis not present

## 2017-05-13 DIAGNOSIS — G4734 Idiopathic sleep related nonobstructive alveolar hypoventilation: Secondary | ICD-10-CM | POA: Diagnosis not present

## 2017-05-17 DIAGNOSIS — R0902 Hypoxemia: Secondary | ICD-10-CM | POA: Diagnosis not present

## 2017-05-17 DIAGNOSIS — G4734 Idiopathic sleep related nonobstructive alveolar hypoventilation: Secondary | ICD-10-CM | POA: Diagnosis not present

## 2017-05-17 DIAGNOSIS — G4733 Obstructive sleep apnea (adult) (pediatric): Secondary | ICD-10-CM | POA: Diagnosis not present

## 2017-05-19 MED FILL — ATORVASTATIN 20 MG TABLET: 20 | 30 days supply | Qty: 30 | Fill #2

## 2017-06-09 DIAGNOSIS — F322 Major depressive disorder, single episode, severe without psychotic features: Secondary | ICD-10-CM | POA: Diagnosis not present

## 2017-06-09 DIAGNOSIS — M797 Fibromyalgia: Secondary | ICD-10-CM | POA: Diagnosis not present

## 2017-06-09 DIAGNOSIS — G4734 Idiopathic sleep related nonobstructive alveolar hypoventilation: Secondary | ICD-10-CM | POA: Diagnosis not present

## 2017-06-09 DIAGNOSIS — E119 Type 2 diabetes mellitus without complications: Secondary | ICD-10-CM | POA: Diagnosis not present

## 2017-06-09 DIAGNOSIS — E559 Vitamin D deficiency, unspecified: Secondary | ICD-10-CM | POA: Diagnosis not present

## 2017-06-09 DIAGNOSIS — E785 Hyperlipidemia, unspecified: Secondary | ICD-10-CM | POA: Diagnosis not present

## 2017-06-12 MED FILL — ATORVASTATIN 20 MG TABLET: 20 | 90 days supply | Qty: 90 | Fill #0

## 2017-06-16 DIAGNOSIS — R0902 Hypoxemia: Secondary | ICD-10-CM | POA: Diagnosis not present

## 2017-06-16 DIAGNOSIS — G4733 Obstructive sleep apnea (adult) (pediatric): Secondary | ICD-10-CM | POA: Diagnosis not present

## 2017-06-16 DIAGNOSIS — G4734 Idiopathic sleep related nonobstructive alveolar hypoventilation: Secondary | ICD-10-CM | POA: Diagnosis not present

## 2017-06-17 ENCOUNTER — Encounter: Payer: Self-pay | Admitting: Pulmonary Disease

## 2017-06-17 ENCOUNTER — Ambulatory Visit (INDEPENDENT_AMBULATORY_CARE_PROVIDER_SITE_OTHER): Payer: 59 | Admitting: Pulmonary Disease

## 2017-06-17 VITALS — BP 120/82 | HR 74 | Ht 63.0 in | Wt 248.6 lb

## 2017-06-17 DIAGNOSIS — E662 Morbid (severe) obesity with alveolar hypoventilation: Secondary | ICD-10-CM | POA: Diagnosis not present

## 2017-06-17 DIAGNOSIS — G4733 Obstructive sleep apnea (adult) (pediatric): Secondary | ICD-10-CM | POA: Diagnosis not present

## 2017-06-17 NOTE — Patient Instructions (Signed)
Will call with results of overnight oxygen test from last October  Follow up in 1 year

## 2017-06-17 NOTE — Progress Notes (Signed)
[facility]  Chief Complaint  Patient presents with  . Follow-up    Pt is doing well overall with cpap machine and on O2 at bedtime with 2 liters of O2.    Vital signs: BP 120/82 (BP Location: Left Arm, Cuff Size: Large)   Pulse 74   Ht 5\' 3"  (1.6 m)   Wt 248 lb 9.6 oz (112.8 kg)   SpO2 95%   BMI 44.04 kg/m   History of Present Illness: Mary [name] is a 62 y.o. female with chronic respiratory failure with OSA and OHS.  She has been doing well with CPAP.  Using 2 liters oxygen at night with CPAP.  Had more trouble sleeping when she didn't use oxygen with CPAP.  No issues with mask fit.  Gets into deep sleep and feels rested.  Physical Exam:  General - pleasant Eyes - pupils reactive ENT - no sinus tenderness, no oral exudate, no LAN, MP 2, decreased AP diameter Cardiac - regular, no murmur Chest - no wheeze, rales Abd - soft, non tender Ext - no edema Skin - no rashes Neuro - normal strength Psych - normal mood   Assessment/Plan:  Obstructive sleep apnea. - she is compliant with CPAP and reports benefit from therapy - continue CPAP 10 cm H2O  Obesity hypoventilation syndrome. - continue 2 liters oxygen at night with CPAP - discussed importance of weight loss   Patient Instructions  Will call with results of overnight oxygen test from last October  Follow up in 1 year    Chesley Mires, MD [place] 06/17/2017, 2:07 PM  Flow Sheet  Sleep tests: PSG 08/17/15 >> AHI 6.1, SpO2 low 89% ONO with 2 liters 12/20/15 >> test time 5 hrs 34 min.  Basal SpO2 94%, low SpO2 83%.  Spent 8 min with SpO2 < 88%. CPAP titration 05/15/16 >> CPAP 10 cm H2O >> AHI 1.2.  Did not need supplemental oxygen. HST 06/12/16 >> 17.2, SaO2 low 82% CPAP 05/17/17 to 06/15/17 >> used on 30 of 30 nights with average 8 hrs 11 min.  Average AHI 3.6 with CPAP 10 cm H2O  Cardiac tests Echo 11/06/15 >> EF 55 to 60%, grade 2 DD  Past  Medical History: She  has a past medical history of Anaphylaxis, Arthritis, Celiac disease, Depression, Fibromyalgia, GERD (gastroesophageal reflux disease), Morbid obesity ([place]) (10/19/2015), PONV (postoperative nausea and vomiting), SVD (spontaneous vaginal delivery), and Systolic murmur (03/13/9796).  Past Surgical History: She  has a past surgical history that includes Spinal fusion; dermoid cystect; Replacement total knee; Hysteroscopy w/D&C (01/28/2012); Wisdom tooth extraction; Laparoscopic assisted vaginal hysterectomy (N/A, 03/30/2013); Bilateral salpingectomy (Bilateral, 03/30/2013); and Cardiac catheterization (N/A, 12/07/2015).  Family History: Her family history includes Atrial fibrillation in her mother; Bradycardia in her mother; Breast cancer in her maternal grandmother; Leukemia in her father.  Social History: She  reports that she has never smoked. She has never used smokeless tobacco. She reports that she drinks alcohol. She reports that she does not use drugs.  Medications: Allergies as of 06/17/2017      Reactions   Aspirin Anaphylaxis, Swelling   Chicory Root [cichorium Intybus] Shortness Of Breath, Itching, Swelling   Swelling of tongue and throat. Scratch throat. cough   Nsaids Anaphylaxis, Swelling   Other Anaphylaxis, Itching, Cough   Pt ate a fruit bar that may have had something that is causing this allergic reaction.      Shellfish Allergy Anaphylaxis, Swelling  Celebrex [celecoxib] Swelling   Facial swelling      Medication List        Accurate as of 06/17/17  2:07 PM. Always use your most recent med list.          acetaminophen 500 MG tablet Commonly known as:  TYLENOL Take 1,000 mg by mouth daily as needed for headache.   diclofenac sodium 1 % Gel Commonly known as:  VOLTAREN Apply 2 g topically daily as needed (for pain).   DULoxetine 60 MG capsule Commonly known as:  CYMBALTA Take 60 mg by mouth at bedtime.   EPINEPHrine 0.3 mg/0.3 mL Soaj  injection Commonly known as:  EPI-PEN Inject 0.3 mg into the muscle once as needed (allergic reaction).   FISH OIL PO Take 1 tablet by mouth daily.   multivitamin with minerals Tabs tablet Take 1 tablet by mouth daily.   nitroGLYCERIN 0.4 MG SL tablet Commonly known as:  NITROSTAT Place 0.4 mg under the tongue every 5 (five) minutes as needed for chest pain.

## 2017-06-25 ENCOUNTER — Telehealth: Payer: Self-pay | Admitting: Pulmonary Disease

## 2017-06-25 NOTE — Telephone Encounter (Signed)
ONO with CPAP and 2 liters 10/23/16 >> test time 3 hrs 25 min.  Average SpO2 92%, low SpO2 89%.     Please let her know that ONO showed good control of oxygen with CPAP and 2 liters.

## 2017-06-25 NOTE — Telephone Encounter (Signed)
Left voice mail on machine for patient to return phone call back regarding results. X1  

## 2017-06-26 NOTE — Telephone Encounter (Signed)
Left voice mail on machine for patient to return phone call back regarding results. X2 

## 2017-06-29 NOTE — Telephone Encounter (Signed)
Called and spoke with patient regarding results.  Informed the patient of results and recommendations today. Pt verbalized understanding and denied any questions or concerns at this time.  Nothing further needed.  

## 2017-07-15 MED FILL — DULoxetine HCL 60 MG CPEP: 60 | 90 days supply | Qty: 90 | Fill #0

## 2017-07-17 DIAGNOSIS — G4734 Idiopathic sleep related nonobstructive alveolar hypoventilation: Secondary | ICD-10-CM | POA: Diagnosis not present

## 2017-07-17 DIAGNOSIS — G4733 Obstructive sleep apnea (adult) (pediatric): Secondary | ICD-10-CM | POA: Diagnosis not present

## 2017-07-17 DIAGNOSIS — R0902 Hypoxemia: Secondary | ICD-10-CM | POA: Diagnosis not present

## 2017-08-16 DIAGNOSIS — G4733 Obstructive sleep apnea (adult) (pediatric): Secondary | ICD-10-CM | POA: Diagnosis not present

## 2017-08-16 DIAGNOSIS — G4734 Idiopathic sleep related nonobstructive alveolar hypoventilation: Secondary | ICD-10-CM | POA: Diagnosis not present

## 2017-08-16 DIAGNOSIS — R0902 Hypoxemia: Secondary | ICD-10-CM | POA: Diagnosis not present

## 2017-09-07 ENCOUNTER — Other Ambulatory Visit: Payer: Self-pay | Admitting: Family Medicine

## 2017-09-07 DIAGNOSIS — Z1231 Encounter for screening mammogram for malignant neoplasm of breast: Secondary | ICD-10-CM

## 2017-09-12 DIAGNOSIS — G4734 Idiopathic sleep related nonobstructive alveolar hypoventilation: Secondary | ICD-10-CM | POA: Diagnosis not present

## 2017-09-12 DIAGNOSIS — R0902 Hypoxemia: Secondary | ICD-10-CM | POA: Diagnosis not present

## 2017-09-12 DIAGNOSIS — G4733 Obstructive sleep apnea (adult) (pediatric): Secondary | ICD-10-CM | POA: Diagnosis not present

## 2017-09-16 DIAGNOSIS — G4734 Idiopathic sleep related nonobstructive alveolar hypoventilation: Secondary | ICD-10-CM | POA: Diagnosis not present

## 2017-09-16 DIAGNOSIS — R0902 Hypoxemia: Secondary | ICD-10-CM | POA: Diagnosis not present

## 2017-09-16 DIAGNOSIS — G4733 Obstructive sleep apnea (adult) (pediatric): Secondary | ICD-10-CM | POA: Diagnosis not present

## 2017-09-24 MED FILL — ATORVASTATIN CALCIUM 20 MG: 20 | 90 days supply | Qty: 90 | Fill #1

## 2017-09-30 DIAGNOSIS — M19041 Primary osteoarthritis, right hand: Secondary | ICD-10-CM | POA: Diagnosis not present

## 2017-09-30 DIAGNOSIS — M79644 Pain in right finger(s): Secondary | ICD-10-CM | POA: Diagnosis not present

## 2017-09-30 DIAGNOSIS — M79641 Pain in right hand: Secondary | ICD-10-CM | POA: Diagnosis not present

## 2017-09-30 DIAGNOSIS — M79645 Pain in left finger(s): Secondary | ICD-10-CM | POA: Diagnosis not present

## 2017-10-05 ENCOUNTER — Ambulatory Visit
Admission: RE | Admit: 2017-10-05 | Discharge: 2017-10-05 | Disposition: A | Discharge status: Home / Self Care | Payer: 59 | Source: Ambulatory Visit | Attending: Family Medicine | Admitting: Family Medicine

## 2017-10-05 DIAGNOSIS — Z1231 Encounter for screening mammogram for malignant neoplasm of breast: Secondary | ICD-10-CM

## 2017-10-17 DIAGNOSIS — R0902 Hypoxemia: Secondary | ICD-10-CM | POA: Diagnosis not present

## 2017-10-17 DIAGNOSIS — G4734 Idiopathic sleep related nonobstructive alveolar hypoventilation: Secondary | ICD-10-CM | POA: Diagnosis not present

## 2017-10-17 DIAGNOSIS — G4733 Obstructive sleep apnea (adult) (pediatric): Secondary | ICD-10-CM | POA: Diagnosis not present

## 2017-10-20 MED FILL — AMOXICILLIN 500 MG CAPSULE: 500 | 2 days supply | Qty: 8 | Fill #0

## 2017-10-20 MED FILL — DULoxetine HCL 60 MG CPEP: 60 | 90 days supply | Qty: 90 | Fill #1

## 2017-11-16 DIAGNOSIS — G4734 Idiopathic sleep related nonobstructive alveolar hypoventilation: Secondary | ICD-10-CM | POA: Diagnosis not present

## 2017-11-16 DIAGNOSIS — G4733 Obstructive sleep apnea (adult) (pediatric): Secondary | ICD-10-CM | POA: Diagnosis not present

## 2017-11-16 DIAGNOSIS — R0902 Hypoxemia: Secondary | ICD-10-CM | POA: Diagnosis not present

## 2017-12-14 DIAGNOSIS — F439 Reaction to severe stress, unspecified: Secondary | ICD-10-CM | POA: Diagnosis not present

## 2017-12-14 DIAGNOSIS — M797 Fibromyalgia: Secondary | ICD-10-CM | POA: Diagnosis not present

## 2017-12-14 DIAGNOSIS — E559 Vitamin D deficiency, unspecified: Secondary | ICD-10-CM | POA: Diagnosis not present

## 2017-12-14 DIAGNOSIS — E785 Hyperlipidemia, unspecified: Secondary | ICD-10-CM | POA: Diagnosis not present

## 2017-12-14 DIAGNOSIS — Z6841 Body Mass Index (BMI) 40.0 and over, adult: Secondary | ICD-10-CM | POA: Diagnosis not present

## 2017-12-14 DIAGNOSIS — E119 Type 2 diabetes mellitus without complications: Secondary | ICD-10-CM | POA: Diagnosis not present

## 2017-12-17 DIAGNOSIS — G4734 Idiopathic sleep related nonobstructive alveolar hypoventilation: Secondary | ICD-10-CM | POA: Diagnosis not present

## 2017-12-17 DIAGNOSIS — R0902 Hypoxemia: Secondary | ICD-10-CM | POA: Diagnosis not present

## 2017-12-17 DIAGNOSIS — G4733 Obstructive sleep apnea (adult) (pediatric): Secondary | ICD-10-CM | POA: Diagnosis not present

## 2017-12-23 DIAGNOSIS — H524 Presbyopia: Secondary | ICD-10-CM | POA: Diagnosis not present

## 2017-12-24 MED FILL — metFORMIN HCL ER 500 MG TB2: 500 | 90 days supply | Qty: 90 | Fill #0

## 2017-12-25 MED FILL — ATORVASTATIN CALCIUM 20 MG: 20 | 90 days supply | Qty: 90 | Fill #2

## 2018-01-16 DIAGNOSIS — G4734 Idiopathic sleep related nonobstructive alveolar hypoventilation: Secondary | ICD-10-CM | POA: Diagnosis not present

## 2018-01-16 DIAGNOSIS — R0902 Hypoxemia: Secondary | ICD-10-CM | POA: Diagnosis not present

## 2018-01-16 DIAGNOSIS — G4733 Obstructive sleep apnea (adult) (pediatric): Secondary | ICD-10-CM | POA: Diagnosis not present

## 2018-01-21 MED FILL — DULoxetine HCL 60 MG CPEP: 60 | 90 days supply | Qty: 90 | Fill #0

## 2018-01-26 DIAGNOSIS — R0902 Hypoxemia: Secondary | ICD-10-CM | POA: Diagnosis not present

## 2018-01-26 DIAGNOSIS — G4734 Idiopathic sleep related nonobstructive alveolar hypoventilation: Secondary | ICD-10-CM | POA: Diagnosis not present

## 2018-01-26 DIAGNOSIS — G4733 Obstructive sleep apnea (adult) (pediatric): Secondary | ICD-10-CM | POA: Diagnosis not present

## 2018-02-16 DIAGNOSIS — G4734 Idiopathic sleep related nonobstructive alveolar hypoventilation: Secondary | ICD-10-CM | POA: Diagnosis not present

## 2018-02-16 DIAGNOSIS — R0902 Hypoxemia: Secondary | ICD-10-CM | POA: Diagnosis not present

## 2018-02-16 DIAGNOSIS — G4733 Obstructive sleep apnea (adult) (pediatric): Secondary | ICD-10-CM | POA: Diagnosis not present

## 2018-02-25 MED FILL — PROMETHAZINE W/DM SYRUP: 6.25-15 | 6 days supply | Qty: 120 | Fill #0

## 2018-03-17 DIAGNOSIS — G4734 Idiopathic sleep related nonobstructive alveolar hypoventilation: Secondary | ICD-10-CM | POA: Diagnosis not present

## 2018-03-17 DIAGNOSIS — E785 Hyperlipidemia, unspecified: Secondary | ICD-10-CM | POA: Diagnosis not present

## 2018-03-17 DIAGNOSIS — M797 Fibromyalgia: Secondary | ICD-10-CM | POA: Diagnosis not present

## 2018-03-17 DIAGNOSIS — E559 Vitamin D deficiency, unspecified: Secondary | ICD-10-CM | POA: Diagnosis not present

## 2018-03-17 DIAGNOSIS — E1169 Type 2 diabetes mellitus with other specified complication: Secondary | ICD-10-CM | POA: Diagnosis not present

## 2018-03-17 DIAGNOSIS — F322 Major depressive disorder, single episode, severe without psychotic features: Secondary | ICD-10-CM | POA: Diagnosis not present

## 2018-03-19 DIAGNOSIS — G4734 Idiopathic sleep related nonobstructive alveolar hypoventilation: Secondary | ICD-10-CM | POA: Diagnosis not present

## 2018-03-19 DIAGNOSIS — R0902 Hypoxemia: Secondary | ICD-10-CM | POA: Diagnosis not present

## 2018-03-19 DIAGNOSIS — G4733 Obstructive sleep apnea (adult) (pediatric): Secondary | ICD-10-CM | POA: Diagnosis not present

## 2018-03-24 MED FILL — ATORVASTATIN 20 MG TABLET: 20 | 90 days supply | Qty: 90 | Fill #3

## 2018-03-24 MED FILL — metFORMIN HCL ER 500 MG TB2: 500 | 90 days supply | Qty: 90 | Fill #1

## 2018-04-05 MED FILL — AMOXICILLIN 500 MG CAPSULE: 500 | 10 days supply | Qty: 40 | Fill #0

## 2018-04-13 MED FILL — DULOXETINE HCL 60 MG CPEP: 60 | 90 days supply | Qty: 90 | Fill #0

## 2018-04-16 MED FILL — metFORMIN HCL ER 500 MG TB2: 500 | 30 days supply | Qty: 120 | Fill #0

## 2018-04-17 DIAGNOSIS — G4734 Idiopathic sleep related nonobstructive alveolar hypoventilation: Secondary | ICD-10-CM | POA: Diagnosis not present

## 2018-04-17 DIAGNOSIS — G4733 Obstructive sleep apnea (adult) (pediatric): Secondary | ICD-10-CM | POA: Diagnosis not present

## 2018-04-17 DIAGNOSIS — R092 Respiratory arrest: Secondary | ICD-10-CM | POA: Diagnosis not present

## 2018-05-12 DIAGNOSIS — G4734 Idiopathic sleep related nonobstructive alveolar hypoventilation: Secondary | ICD-10-CM | POA: Diagnosis not present

## 2018-05-12 DIAGNOSIS — G4733 Obstructive sleep apnea (adult) (pediatric): Secondary | ICD-10-CM | POA: Diagnosis not present

## 2018-05-15 MED FILL — metFORMIN HCL ER 500 MG TB2: 500 | 30 days supply | Qty: 120 | Fill #1

## 2018-05-18 ENCOUNTER — Encounter: Payer: Self-pay | Admitting: Adult Health

## 2018-05-18 ENCOUNTER — Other Ambulatory Visit: Payer: Self-pay

## 2018-05-18 ENCOUNTER — Ambulatory Visit (INDEPENDENT_AMBULATORY_CARE_PROVIDER_SITE_OTHER): Payer: 59 | Admitting: Adult Health

## 2018-05-18 ENCOUNTER — Ambulatory Visit: Payer: Self-pay | Admitting: Nurse Practitioner

## 2018-05-18 DIAGNOSIS — G4733 Obstructive sleep apnea (adult) (pediatric): Secondary | ICD-10-CM | POA: Diagnosis not present

## 2018-05-18 DIAGNOSIS — R0902 Hypoxemia: Secondary | ICD-10-CM | POA: Diagnosis not present

## 2018-05-18 DIAGNOSIS — G4734 Idiopathic sleep related nonobstructive alveolar hypoventilation: Secondary | ICD-10-CM | POA: Diagnosis not present

## 2018-05-18 NOTE — Patient Instructions (Signed)
Keep up good work .  Continue on CPAP At bedtime   Work on healthy weight  Do not drive if sleepy  Follow with Dr. Halford Chessman  Or [place] NP in 1 year and As needed

## 2018-05-18 NOTE — Assessment & Plan Note (Signed)
Excellent control and compliance on CPAP with oxygen  Plan  Patient Instructions  Keep up good work .  Continue on CPAP At bedtime   Work on healthy weight  Do not drive if sleepy  Follow with Dr. Halford Chessman  Or Amani Marse[place] NP in 1 year and As needed

## 2018-05-18 NOTE — Addendum Note (Signed)
Addended by: Parke Poisson E on: 05/18/2018 11:04 AM   Modules accepted: Orders

## 2018-05-18 NOTE — Assessment & Plan Note (Signed)
Continue to work on healthy weight loss 

## 2018-05-18 NOTE — Progress Notes (Signed)
@Patient  ID: Mary [name], female    DOB: January 18, 1956, 63 y.o.   MRN: [id number]  Chief Complaint  Patient presents with  . 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  TEST/EVENTS :  PSG 08/17/15 >> AHI 6.1, SpO2 low 89% ONO with 2 liters 12/20/15 >>test time 5 hrs 34 min. Basal SpO2 94%, low SpO2 83%. Spent 8 min with SpO2 <88%. CPAP titration 05/15/16 >>CPAP 10 cm H2O >>AHI 1.2. Did not need supplemental oxygen. HST 06/12/16 >> 17.2, SaO2 low 82% CPAP 05/17/17 to 06/15/17 >> used on 30 of 30 nights with average 8 hrs 11 min.  Average AHI 3.6 with CPAP 10 cm H2O ONO with CPAP and 2 liters 10/23/16 >> test time 3 hrs 25 min.  Average SpO2 92%, low SpO2 89%.    Cardiac tests Echo 11/06/15 >> EF 55 to 60%, grade 2 DD  05/18/2018 Follow up: OSA/OHS  Patient returns for a one-year follow-up.  Patient has underlying obstructive sleep apnea and obesity hypoventilation syndrome.  She is on nocturnal CPAP with oxygen at 2 L.  Patient says she has been doing well on CPAP.  She feels rested with no significant daytime sleepiness.  She feels that she benefits from CPAP.  Download shows excellent compliance with 100% usage.  Daily average usage at 6.75 hours.  AHI 3/hour.  Minimum leaks.   Allergies  Allergen Reactions  . Aspirin Anaphylaxis and Swelling  . 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 Scratch throat. cough  . Nsaids Anaphylaxis and Swelling  . Other Anaphylaxis, Itching and Cough    Pt ate a fruit bar that may have had something that is causing this allergic reaction.     . Shellfish Allergy Anaphylaxis and Swelling  . Celebrex [Celecoxib] Swelling    Facial swelling    Immunization History  Administered  Date(s) Administered  . Influenza Split 10/25/2015, 10/18/2016, 10/19/2017    Past Medical History:  Diagnosis Date  . Anaphylaxis    Chicory root, shellfish  . Arthritis    hands, knees, neck - no meds  . Celiac disease   . Depression   . Fibromyalgia   . GERD (gastroesophageal reflux disease)    no meds  . Morbid obesity ([place]) 10/19/2015  . PONV (postoperative nausea and vomiting)   . SVD (spontaneous vaginal delivery)    x 2  . Systolic murmur 4/81/8563    Tobacco History: Social History   Tobacco Use  Smoking Status Never Smoker  Smokeless Tobacco Never Used   Counseling given: Not Answered   Outpatient Medications Prior to Visit  Medication Sig Dispense Refill  . acetaminophen (TYLENOL) 500 MG tablet Take 1,000 mg by mouth daily as needed for headache.    . diclofenac sodium (VOLTAREN) 1 % GEL Apply 2 g topically daily as needed (for pain).    . DULoxetine (CYMBALTA) 60 MG capsule Take 60 mg by mouth at bedtime.     Marland Kitchen EPINEPHrine 0.3 mg/0.3 mL IJ SOAJ injection Inject 0.3 mg into the muscle once as needed (allergic reaction).     . metFORMIN (GLUCOPHAGE) 500 MG tablet Take 2,000 mg by mouth every evening.    . Multiple Vitamin (MULTIVITAMIN WITH MINERALS)  TABS tablet Take 1 tablet by mouth daily.    . nitroGLYCERIN (NITROSTAT) 0.4 MG SL tablet Place 0.4 mg under the tongue every 5 (five) minutes as needed for chest pain.    . Omega-3 Fatty Acids (FISH OIL PO) Take 1 tablet by mouth daily.     No facility-administered medications prior to visit.      Review of Systems:   Constitutional:   No  weight loss, night sweats,  Fevers, chills, fatigue, or  lassitude.  HEENT:   No headaches,  Difficulty swallowing,  Tooth/dental problems, or  Sore throat,                No sneezing, itching, ear ache, nasal congestion, post nasal drip,   CV:  No chest pain,  Orthopnea, PND, swelling in lower extremities, anasarca, dizziness, palpitations, syncope.   GI  No heartburn,  indigestion, abdominal pain, nausea, vomiting, diarrhea, change in bowel habits, loss of appetite, bloody stools.   Resp: No shortness of breath with exertion or at rest.  No excess mucus, no productive cough,  No non-productive cough,  No coughing up of blood.  No change in color of mucus.  No wheezing.  No chest wall deformity  Skin: no rash or lesions.  GU: no dysuria, change in color of urine, no urgency or frequency.  No flank pain, no hematuria   MS:  No joint pain or swelling.  No decreased range of motion.  No back pain.    Physical Exam  BP 126/74 (BP Location: Left Arm, Cuff Size: Large)   Pulse 79   Ht 5\' 3"  (1.6 m)   Wt 246 lb (111.6 kg)   SpO2 95%   BMI 43.58 kg/m   GEN: A/Ox3; pleasant , NAD, obese    HEENT:  [place]/AT,   , NOSE-clear, THROAT-clear, no lesions, no postnasal drip or exudate noted. Class 2-3 MP airway   NECK:  Supple w/ fair ROM; no JVD; normal carotid impulses w/o bruits; no thyromegaly or nodules palpated; no lymphadenopathy.    RESP  Clear  P & A; w/o, wheezes/ rales/ or rhonchi. no accessory muscle use, no dullness to percussion  CARD:  RRR, no m/r/g, no peripheral edema, pulses intact, no cyanosis or clubbing.  GI:   Soft & nt; nml bowel sounds; no organomegaly or masses detected.   Musco: Warm bil, no deformities or joint swelling noted.   Neuro: alert, no focal deficits noted.    Skin: Warm, no lesions or rashes    Lab Results:  CBC   BMET  BNP No results found for: BNP  ProBNP No results found for: PROBNP  Imaging: No results found.    No flowsheet data found.  No results found for: NITRICOXIDE      Assessment & Plan:   OSA (obstructive sleep apnea) Excellent control and compliance on CPAP with oxygen  Plan  Patient Instructions  Keep up good work .  Continue on CPAP At bedtime   Work on healthy weight  Do not drive if sleepy  Follow with Dr. Halford Chessman  Or Rola Lennon NP in 1 year and As needed        Morbid  obesity ([place]) Continue to work on healthy weight loss     Rexene Edison, NP 05/18/2018

## 2018-05-24 ENCOUNTER — Ambulatory Visit: Payer: Self-pay | Admitting: Pulmonary Disease

## 2018-06-15 MED FILL — metFORMIN HCL ER 500 MG TB2: 500 | 30 days supply | Qty: 120 | Fill #0

## 2018-06-17 DIAGNOSIS — G4734 Idiopathic sleep related nonobstructive alveolar hypoventilation: Secondary | ICD-10-CM | POA: Diagnosis not present

## 2018-06-17 DIAGNOSIS — R0902 Hypoxemia: Secondary | ICD-10-CM | POA: Diagnosis not present

## 2018-06-17 DIAGNOSIS — G4733 Obstructive sleep apnea (adult) (pediatric): Secondary | ICD-10-CM | POA: Diagnosis not present

## 2018-06-25 MED FILL — ATORVASTATIN 20 MG TABLET: 20 | 90 days supply | Qty: 90 | Fill #0

## 2018-07-12 MED FILL — METFORMIN HCL ER 500 MG TB2: 500 | 30 days supply | Qty: 120 | Fill #1

## 2018-07-12 MED FILL — DULOXETINE HCL 60 MG CPEP: 60 | 90 days supply | Qty: 90 | Fill #1

## 2018-07-18 DIAGNOSIS — G4734 Idiopathic sleep related nonobstructive alveolar hypoventilation: Secondary | ICD-10-CM | POA: Diagnosis not present

## 2018-07-18 DIAGNOSIS — G4733 Obstructive sleep apnea (adult) (pediatric): Secondary | ICD-10-CM | POA: Diagnosis not present

## 2018-07-18 DIAGNOSIS — R0902 Hypoxemia: Secondary | ICD-10-CM | POA: Diagnosis not present

## 2018-08-13 MED FILL — METFORMIN HCL ER 500 MG TB2: 500 | 30 days supply | Qty: 120 | Fill #0

## 2018-08-17 DIAGNOSIS — G4733 Obstructive sleep apnea (adult) (pediatric): Secondary | ICD-10-CM | POA: Diagnosis not present

## 2018-08-17 DIAGNOSIS — G4734 Idiopathic sleep related nonobstructive alveolar hypoventilation: Secondary | ICD-10-CM | POA: Diagnosis not present

## 2018-08-17 DIAGNOSIS — R0902 Hypoxemia: Secondary | ICD-10-CM | POA: Diagnosis not present

## 2018-08-18 DIAGNOSIS — G4734 Idiopathic sleep related nonobstructive alveolar hypoventilation: Secondary | ICD-10-CM | POA: Diagnosis not present

## 2018-08-18 DIAGNOSIS — G4733 Obstructive sleep apnea (adult) (pediatric): Secondary | ICD-10-CM | POA: Diagnosis not present

## 2018-08-25 DIAGNOSIS — M797 Fibromyalgia: Secondary | ICD-10-CM | POA: Diagnosis not present

## 2018-08-25 DIAGNOSIS — F322 Major depressive disorder, single episode, severe without psychotic features: Secondary | ICD-10-CM | POA: Diagnosis not present

## 2018-08-25 DIAGNOSIS — E559 Vitamin D deficiency, unspecified: Secondary | ICD-10-CM | POA: Diagnosis not present

## 2018-08-25 DIAGNOSIS — E1169 Type 2 diabetes mellitus with other specified complication: Secondary | ICD-10-CM | POA: Diagnosis not present

## 2018-08-25 DIAGNOSIS — E785 Hyperlipidemia, unspecified: Secondary | ICD-10-CM | POA: Diagnosis not present

## 2018-08-25 DIAGNOSIS — Z0000 Encounter for general adult medical examination without abnormal findings: Secondary | ICD-10-CM | POA: Diagnosis not present

## 2018-08-25 DIAGNOSIS — G4734 Idiopathic sleep related nonobstructive alveolar hypoventilation: Secondary | ICD-10-CM | POA: Diagnosis not present

## 2018-08-30 ENCOUNTER — Other Ambulatory Visit: Payer: Self-pay

## 2018-08-30 DIAGNOSIS — Z20822 Contact with and (suspected) exposure to covid-19: Secondary | ICD-10-CM

## 2018-08-30 NOTE — Progress Notes (Signed)
Reviewed and agree with assessment/plan.   Saleemah Mollenhauer, MD [place] [facility]/Critical Care 01/16/2016, 12:24 PM Pager:  [phone]  

## 2018-09-01 ENCOUNTER — Other Ambulatory Visit: Payer: Self-pay | Admitting: Family Medicine

## 2018-09-01 DIAGNOSIS — Z1231 Encounter for screening mammogram for malignant neoplasm of breast: Secondary | ICD-10-CM

## 2018-09-13 DIAGNOSIS — Z23 Encounter for immunization: Secondary | ICD-10-CM | POA: Diagnosis not present

## 2018-09-13 DIAGNOSIS — E1169 Type 2 diabetes mellitus with other specified complication: Secondary | ICD-10-CM | POA: Diagnosis not present

## 2018-09-16 MED FILL — metFORMIN HCL ER 500 MG TB2: 500 | 30 days supply | Qty: 120 | Fill #1

## 2018-09-17 DIAGNOSIS — R0902 Hypoxemia: Secondary | ICD-10-CM | POA: Diagnosis not present

## 2018-09-17 DIAGNOSIS — G4734 Idiopathic sleep related nonobstructive alveolar hypoventilation: Secondary | ICD-10-CM | POA: Diagnosis not present

## 2018-09-17 DIAGNOSIS — G4733 Obstructive sleep apnea (adult) (pediatric): Secondary | ICD-10-CM | POA: Diagnosis not present

## 2018-09-20 MED FILL — ATORVASTATIN 20 MG TABLET: 20 | 90 days supply | Qty: 90 | Fill #1

## 2018-10-07 ENCOUNTER — Ambulatory Visit
Admission: RE | Admit: 2018-10-07 | Discharge: 2018-10-07 | Disposition: A | Discharge status: Home / Self Care | Payer: 59 | Source: Ambulatory Visit | Attending: Family Medicine | Admitting: Family Medicine

## 2018-10-07 DIAGNOSIS — Z1231 Encounter for screening mammogram for malignant neoplasm of breast: Secondary | ICD-10-CM | POA: Diagnosis not present

## 2018-10-18 DIAGNOSIS — G4733 Obstructive sleep apnea (adult) (pediatric): Secondary | ICD-10-CM | POA: Diagnosis not present

## 2018-10-18 DIAGNOSIS — G4734 Idiopathic sleep related nonobstructive alveolar hypoventilation: Secondary | ICD-10-CM | POA: Diagnosis not present

## 2018-10-18 DIAGNOSIS — R0902 Hypoxemia: Secondary | ICD-10-CM | POA: Diagnosis not present

## 2018-10-19 MED FILL — DULOXETINE HCL 60 MG CPEP: 60 | 90 days supply | Qty: 90 | Fill #2

## 2018-10-19 MED FILL — METFORMIN HCL ER 500 MG TB2: 500 | 30 days supply | Qty: 120 | Fill #0

## 2018-10-26 MED FILL — TRIAMCINOLONE 0.1% PASTE: 0.1 | 10 days supply | Qty: 5 | Fill #0

## 2018-10-27 MED FILL — FREESTYLE LANCETS: 90 days supply | Qty: 100 | Fill #0

## 2018-10-27 MED FILL — FREESTYLE LITE TEST STRIP: 90 days supply | Qty: 100 | Fill #0

## 2018-11-17 DIAGNOSIS — G4734 Idiopathic sleep related nonobstructive alveolar hypoventilation: Secondary | ICD-10-CM | POA: Diagnosis not present

## 2018-11-17 DIAGNOSIS — G4733 Obstructive sleep apnea (adult) (pediatric): Secondary | ICD-10-CM | POA: Diagnosis not present

## 2018-11-17 DIAGNOSIS — R0902 Hypoxemia: Secondary | ICD-10-CM | POA: Diagnosis not present

## 2018-11-19 DIAGNOSIS — G4734 Idiopathic sleep related nonobstructive alveolar hypoventilation: Secondary | ICD-10-CM | POA: Diagnosis not present

## 2018-11-19 DIAGNOSIS — G4733 Obstructive sleep apnea (adult) (pediatric): Secondary | ICD-10-CM | POA: Diagnosis not present

## 2018-11-22 MED FILL — metFORMIN HCL ER 500 MG TB2: 500 | 30 days supply | Qty: 120 | Fill #1

## 2018-12-18 DIAGNOSIS — R0902 Hypoxemia: Secondary | ICD-10-CM | POA: Diagnosis not present

## 2018-12-18 DIAGNOSIS — G4734 Idiopathic sleep related nonobstructive alveolar hypoventilation: Secondary | ICD-10-CM | POA: Diagnosis not present

## 2018-12-18 DIAGNOSIS — G4733 Obstructive sleep apnea (adult) (pediatric): Secondary | ICD-10-CM | POA: Diagnosis not present

## 2018-12-27 DIAGNOSIS — H524 Presbyopia: Secondary | ICD-10-CM | POA: Diagnosis not present

## 2018-12-28 MED FILL — ATORVASTATIN 20 MG TABLET: 20 | 90 days supply | Qty: 90 | Fill #2

## 2019-01-02 MED FILL — metFORMIN HCL ER 500 MG TB2: 500 | 30 days supply | Qty: 120 | Fill #2

## 2019-01-17 DIAGNOSIS — G4734 Idiopathic sleep related nonobstructive alveolar hypoventilation: Secondary | ICD-10-CM | POA: Diagnosis not present

## 2019-01-17 DIAGNOSIS — R0902 Hypoxemia: Secondary | ICD-10-CM | POA: Diagnosis not present

## 2019-01-17 DIAGNOSIS — G4733 Obstructive sleep apnea (adult) (pediatric): Secondary | ICD-10-CM | POA: Diagnosis not present

## 2019-01-27 MED FILL — DULOXETINE HCL 60 MG CPEP: 60 | 30 days supply | Qty: 30 | Fill #0

## 2019-02-17 DIAGNOSIS — G4734 Idiopathic sleep related nonobstructive alveolar hypoventilation: Secondary | ICD-10-CM | POA: Diagnosis not present

## 2019-02-17 DIAGNOSIS — R0902 Hypoxemia: Secondary | ICD-10-CM | POA: Diagnosis not present

## 2019-02-17 DIAGNOSIS — G4733 Obstructive sleep apnea (adult) (pediatric): Secondary | ICD-10-CM | POA: Diagnosis not present

## 2019-02-18 DIAGNOSIS — G4734 Idiopathic sleep related nonobstructive alveolar hypoventilation: Secondary | ICD-10-CM | POA: Diagnosis not present

## 2019-02-18 DIAGNOSIS — G4733 Obstructive sleep apnea (adult) (pediatric): Secondary | ICD-10-CM | POA: Diagnosis not present

## 2019-03-03 DIAGNOSIS — G4734 Idiopathic sleep related nonobstructive alveolar hypoventilation: Secondary | ICD-10-CM | POA: Diagnosis not present

## 2019-03-03 DIAGNOSIS — E1169 Type 2 diabetes mellitus with other specified complication: Secondary | ICD-10-CM | POA: Diagnosis not present

## 2019-03-03 DIAGNOSIS — E785 Hyperlipidemia, unspecified: Secondary | ICD-10-CM | POA: Diagnosis not present

## 2019-03-03 DIAGNOSIS — M797 Fibromyalgia: Secondary | ICD-10-CM | POA: Diagnosis not present

## 2019-03-03 DIAGNOSIS — E559 Vitamin D deficiency, unspecified: Secondary | ICD-10-CM | POA: Diagnosis not present

## 2019-03-03 DIAGNOSIS — F322 Major depressive disorder, single episode, severe without psychotic features: Secondary | ICD-10-CM | POA: Diagnosis not present

## 2019-03-07 DIAGNOSIS — E785 Hyperlipidemia, unspecified: Secondary | ICD-10-CM | POA: Diagnosis not present

## 2019-03-07 DIAGNOSIS — E559 Vitamin D deficiency, unspecified: Secondary | ICD-10-CM | POA: Diagnosis not present

## 2019-03-07 DIAGNOSIS — E1169 Type 2 diabetes mellitus with other specified complication: Secondary | ICD-10-CM | POA: Diagnosis not present

## 2019-03-20 DIAGNOSIS — R0902 Hypoxemia: Secondary | ICD-10-CM | POA: Diagnosis not present

## 2019-03-20 DIAGNOSIS — G4733 Obstructive sleep apnea (adult) (pediatric): Secondary | ICD-10-CM | POA: Diagnosis not present

## 2019-03-20 DIAGNOSIS — G4734 Idiopathic sleep related nonobstructive alveolar hypoventilation: Secondary | ICD-10-CM | POA: Diagnosis not present

## 2019-04-17 DIAGNOSIS — R0902 Hypoxemia: Secondary | ICD-10-CM | POA: Diagnosis not present

## 2019-04-17 DIAGNOSIS — G4733 Obstructive sleep apnea (adult) (pediatric): Secondary | ICD-10-CM | POA: Diagnosis not present

## 2019-04-17 DIAGNOSIS — G4734 Idiopathic sleep related nonobstructive alveolar hypoventilation: Secondary | ICD-10-CM | POA: Diagnosis not present

## 2019-05-18 ENCOUNTER — Ambulatory Visit: Payer: 59 | Admitting: Adult Health

## 2019-05-18 DIAGNOSIS — G4734 Idiopathic sleep related nonobstructive alveolar hypoventilation: Secondary | ICD-10-CM | POA: Diagnosis not present

## 2019-05-18 DIAGNOSIS — R0902 Hypoxemia: Secondary | ICD-10-CM | POA: Diagnosis not present

## 2019-05-18 DIAGNOSIS — G4733 Obstructive sleep apnea (adult) (pediatric): Secondary | ICD-10-CM | POA: Diagnosis not present

## 2019-05-26 DIAGNOSIS — G4734 Idiopathic sleep related nonobstructive alveolar hypoventilation: Secondary | ICD-10-CM | POA: Diagnosis not present

## 2019-05-26 DIAGNOSIS — G4733 Obstructive sleep apnea (adult) (pediatric): Secondary | ICD-10-CM | POA: Diagnosis not present

## 2019-06-17 DIAGNOSIS — R0902 Hypoxemia: Secondary | ICD-10-CM | POA: Diagnosis not present

## 2019-06-17 DIAGNOSIS — G4734 Idiopathic sleep related nonobstructive alveolar hypoventilation: Secondary | ICD-10-CM | POA: Diagnosis not present

## 2019-06-17 DIAGNOSIS — G4733 Obstructive sleep apnea (adult) (pediatric): Secondary | ICD-10-CM | POA: Diagnosis not present

## 2019-06-28 ENCOUNTER — Other Ambulatory Visit: Payer: Self-pay

## 2019-06-28 ENCOUNTER — Encounter: Payer: Self-pay | Admitting: Adult Health

## 2019-06-28 ENCOUNTER — Ambulatory Visit: Payer: Medicare HMO | Admitting: Adult Health

## 2019-06-28 DIAGNOSIS — G4733 Obstructive sleep apnea (adult) (pediatric): Secondary | ICD-10-CM

## 2019-06-28 NOTE — Progress Notes (Signed)
Reviewed and agree with assessment/plan.   Chesley Mires, MD [facility] Pulmonary/Critical Care 06/28/2019, 10:53 AM Pager:  [phone]

## 2019-06-28 NOTE — Assessment & Plan Note (Signed)
OSA and OHS with excellent compliance and control on nocturnal CPAP with oxygen  Plan  Patient Instructions  Keep up good work .  Continue on CPAP At bedtime with Oxygen 2l/m .  Work on healthy weight  Do not drive if sleepy  Follow with Dr. Halford Chessman  Or Genesee Nase NP in 1 year and As needed

## 2019-06-28 NOTE — Patient Instructions (Signed)
Keep up good work .  Continue on CPAP At bedtime with Oxygen 2l/m .  Work on healthy weight  Do not drive if sleepy  Follow with Dr. Halford Chessman  Or Ladonne Sharples NP in 1 year and As needed

## 2019-06-28 NOTE — Progress Notes (Signed)
@Patient  ID: Mary [name], female    DOB: 06-09-1955, 64 y.o.   MRN: [id number]  Chief Complaint  Patient presents with   Follow-up    OSA     Referring provider: Harlan Stains, MD  HPI: 64 year old female followed for obstructive sleep apnea and OHS on CPAP with oxygen at 2 L. 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 Basal SpO2 94%, low SpO2 83%. Spent 8 min with SpO2 <88%. CPAP titration 05/15/16 >>CPAP 10 cm H2O >>AHI 1.2. Did not need supplemental oxygen. HST 06/12/16 >> 17.2, SaO2 low 82% CPAP 05/17/17 to 06/15/17 >>used on 30 of 30 nights with average 8 hrs 11 min. Average AHI 3.6 with CPAP 10 cm H2O ONO with CPAP and 2 liters 10/23/16 >>test time 3 hrs 25 min. Average SpO2 92%, low SpO2 89%.   Cardiac tests Echo 11/06/15 >> EF 55 to 60%, grade 2 DD  06/28/2019 Follow up : OSA/OHS  Patient returns for a 1 year follow-up.  Patient has underlying obstructive sleep apnea and obesity hypoventilation syndrome.  She is on nocturnal CPAP with oxygen at 2 L.  Patient says she has been doing very well on CPAP.  She feels rested with no significant daytime sleepiness.  Feels that she benefits from CPAP.  Says she notices a substantial benefit after starting CPAP.  Patient says since retirement this past year she has been trying to work on being healthier.  She is down 20 pounds.  CPAP download shows excellent compliance with daily average usage at 7 hours.  She is 100% compliant.  She is on CPAP 10 cm H2O.  AHI 2.0.  Minimal leaks.  Patient says she has helping with her family.  Her mom is on dialysis.  And her brother recently had a lung transplant.   Allergies  Allergen Reactions   Aspirin Anaphylaxis and Swelling   Chicory Root [Cichorium Intybus] Shortness Of Breath, Itching and Swelling    Swelling of  tongue and throat. Scratch throat. cough   Nsaids Anaphylaxis and Swelling   Other Anaphylaxis, Itching and Cough    Pt ate a fruit bar that may have had something that is causing this allergic reaction.      Shellfish Allergy Anaphylaxis and Swelling   Celebrex [Celecoxib] Swelling    Facial swelling    Immunization History  Administered Date(s) Administered   Influenza Split 10/25/2015, 10/18/2016, 10/19/2017   Influenza,inj,quad, With Preservative 10/22/2016   PFIZER SARS-COV-2 Vaccination 02/28/2019, 03/21/2019   Zoster Recombinat (Shingrix) 02/26/2018, 09/02/2018    Past Medical History:  Diagnosis Date   Anaphylaxis    Chicory root, shellfish   Arthritis    hands, knees, neck - no meds   Celiac disease    Depression    Fibromyalgia    GERD (gastroesophageal reflux disease)    no meds   Morbid obesity ([place]) 10/19/2015   PONV (postoperative nausea and vomiting)    SVD (spontaneous vaginal delivery)    x 2   Systolic murmur 8/58/8502    Tobacco History: Social History   Tobacco Use  Smoking Status Never Smoker  Smokeless Tobacco Never Used   Counseling given: Not Answered   Outpatient Medications Prior to Visit  Medication Sig Dispense Refill   acetaminophen (TYLENOL) 500 MG tablet Take 1,000 mg by mouth daily as  needed for headache.     atorvastatin (LIPITOR) 20 MG tablet atorvastatin 20 mg tablet     diclofenac sodium (VOLTAREN) 1 % GEL Apply 2 g topically daily as needed (for pain).     DULoxetine (CYMBALTA) 60 MG capsule Take 60 mg by mouth at bedtime.      EPINEPHrine 0.3 mg/0.3 mL IJ SOAJ injection Inject 0.3 mg into the muscle once as needed (allergic reaction).      metFORMIN (GLUCOPHAGE) 500 MG tablet Take 2,000 mg by mouth every evening.     Multiple Vitamin (MULTIVITAMIN WITH MINERALS) TABS tablet Take 1 tablet by mouth daily.     nitroGLYCERIN (NITROSTAT) 0.4 MG SL tablet Place 0.4 mg under the tongue every 5 (five) minutes  as needed for chest pain.     Omega-3 Fatty Acids (FISH OIL PO) Take 1 tablet by mouth daily.     metFORMIN (GLUCOPHAGE-XR) 500 MG 24 hr tablet      No facility-administered medications prior to visit.     Review of Systems:   Constitutional:   No  weight loss, night sweats,  Fevers, chills, fatigue, or  lassitude.  HEENT:   No headaches,  Difficulty swallowing,  Tooth/dental problems, or  Sore throat,                No sneezing, itching, ear ache, nasal congestion, post nasal drip,   CV:  No chest pain,  Orthopnea, PND, swelling in lower extremities, anasarca, dizziness, palpitations, syncope.   GI  No heartburn, indigestion, abdominal pain, nausea, vomiting, diarrhea, change in bowel habits, loss of appetite, bloody stools.   Resp: No shortness of breath with exertion or at rest.  No excess mucus, no productive cough,  No non-productive cough,  No coughing up of blood.  No change in color of mucus.  No wheezing.  No chest wall deformity  Skin: no rash or lesions.  GU: no dysuria, change in color of urine, no urgency or frequency.  No flank pain, no hematuria   MS:  No joint pain or swelling.  No decreased range of motion.  No back pain.    Physical Exam  BP 122/72 (BP Location: Left Arm, Cuff Size: Normal)    Pulse 65    Temp (!) 97.3 F (36.3 C) (Oral)    Ht 5\' 3"  (1.6 m)    Wt 230 lb 6.4 oz (104.5 kg)    SpO2 96%    BMI 40.81 kg/m   GEN: A/Ox3; pleasant , NAD, BMI 40   HEENT:  [place]/AT, NOSE-clear, THROAT-clear, no lesions, no postnasal drip or exudate noted.  Class 2-3 MP airway  NECK:  Supple w/ fair ROM; no JVD; normal carotid impulses w/o bruits; no thyromegaly or nodules palpated; no lymphadenopathy.    RESP  Clear  P & A; w/o, wheezes/ rales/ or rhonchi. no accessory muscle use, no dullness to percussion  CARD:  RRR, no m/r/g, no peripheral edema, pulses intact, no cyanosis or clubbing.  GI:   Soft & nt; nml bowel sounds; no organomegaly or masses detected.    Musco: Warm bil, no deformities or joint swelling noted.   Neuro: alert, no focal deficits noted.    Skin: Warm, no lesions or rashes    Lab Results:  CBC   BMET   BNP No results found for: BNP  ProBNP No results found for: PROBNP  Imaging: No results found.    No flowsheet data found.  No results found for: NITRICOXIDE  Assessment & Plan:   OSA (obstructive sleep apnea) OSA and OHS with excellent compliance and control on nocturnal CPAP with oxygen  Plan  Patient Instructions  Keep up good work .  Continue on CPAP At bedtime with Oxygen 2l/m .  Work on healthy weight  Do not drive if sleepy  Follow with Dr. Halford Chessman  Or Kimberleigh Mehan NP in 1 year and As needed        Morbid obesity ([place]) Patient congratulated on weight loss.  Continue with a healthy diet and weight loss plan.  Activity as tolerated     Rexene Edison, NP 06/28/2019

## 2019-06-28 NOTE — Assessment & Plan Note (Signed)
Patient congratulated on weight loss.  Continue with a healthy diet and weight loss plan.  Activity as tolerated

## 2019-07-18 DIAGNOSIS — R0902 Hypoxemia: Secondary | ICD-10-CM | POA: Diagnosis not present

## 2019-07-18 DIAGNOSIS — G4734 Idiopathic sleep related nonobstructive alveolar hypoventilation: Secondary | ICD-10-CM | POA: Diagnosis not present

## 2019-07-18 DIAGNOSIS — G4733 Obstructive sleep apnea (adult) (pediatric): Secondary | ICD-10-CM | POA: Diagnosis not present

## 2019-08-17 DIAGNOSIS — G4734 Idiopathic sleep related nonobstructive alveolar hypoventilation: Secondary | ICD-10-CM | POA: Diagnosis not present

## 2019-08-17 DIAGNOSIS — G4733 Obstructive sleep apnea (adult) (pediatric): Secondary | ICD-10-CM | POA: Diagnosis not present

## 2019-08-17 DIAGNOSIS — R0902 Hypoxemia: Secondary | ICD-10-CM | POA: Diagnosis not present

## 2019-09-17 DIAGNOSIS — R0902 Hypoxemia: Secondary | ICD-10-CM | POA: Diagnosis not present

## 2019-09-17 DIAGNOSIS — G4733 Obstructive sleep apnea (adult) (pediatric): Secondary | ICD-10-CM | POA: Diagnosis not present

## 2019-09-17 DIAGNOSIS — G4734 Idiopathic sleep related nonobstructive alveolar hypoventilation: Secondary | ICD-10-CM | POA: Diagnosis not present

## 2019-09-22 DIAGNOSIS — G4733 Obstructive sleep apnea (adult) (pediatric): Secondary | ICD-10-CM | POA: Diagnosis not present

## 2019-09-22 DIAGNOSIS — G4734 Idiopathic sleep related nonobstructive alveolar hypoventilation: Secondary | ICD-10-CM | POA: Diagnosis not present

## 2019-10-18 ENCOUNTER — Other Ambulatory Visit: Payer: Self-pay | Admitting: Family Medicine

## 2019-10-18 DIAGNOSIS — R0902 Hypoxemia: Secondary | ICD-10-CM | POA: Diagnosis not present

## 2019-10-18 DIAGNOSIS — Z1231 Encounter for screening mammogram for malignant neoplasm of breast: Secondary | ICD-10-CM

## 2019-10-18 DIAGNOSIS — G4733 Obstructive sleep apnea (adult) (pediatric): Secondary | ICD-10-CM | POA: Diagnosis not present

## 2019-10-18 DIAGNOSIS — G4734 Idiopathic sleep related nonobstructive alveolar hypoventilation: Secondary | ICD-10-CM | POA: Diagnosis not present

## 2019-11-08 ENCOUNTER — Other Ambulatory Visit: Payer: Self-pay | Admitting: Family Medicine

## 2019-11-08 DIAGNOSIS — Z1231 Encounter for screening mammogram for malignant neoplasm of breast: Secondary | ICD-10-CM

## 2019-11-10 ENCOUNTER — Other Ambulatory Visit (HOSPITAL_COMMUNITY): Payer: Self-pay

## 2019-11-10 ENCOUNTER — Ambulatory Visit
Admission: RE | Admit: 2019-11-10 | Discharge: 2019-11-10 | Disposition: A | Discharge status: Home / Self Care | Payer: Medicare HMO | Source: Ambulatory Visit

## 2019-11-10 ENCOUNTER — Other Ambulatory Visit: Payer: Self-pay

## 2019-11-10 DIAGNOSIS — Z1231 Encounter for screening mammogram for malignant neoplasm of breast: Secondary | ICD-10-CM | POA: Diagnosis not present

## 2019-11-10 MED FILL — AMOXICILLIN 500 MG CAPSULE: 500 | 2 days supply | Qty: 8 | Fill #0

## 2019-11-17 DIAGNOSIS — G4733 Obstructive sleep apnea (adult) (pediatric): Secondary | ICD-10-CM | POA: Diagnosis not present

## 2019-11-17 DIAGNOSIS — R0902 Hypoxemia: Secondary | ICD-10-CM | POA: Diagnosis not present

## 2019-11-17 DIAGNOSIS — G4734 Idiopathic sleep related nonobstructive alveolar hypoventilation: Secondary | ICD-10-CM | POA: Diagnosis not present

## 2019-11-21 ENCOUNTER — Other Ambulatory Visit (HOSPITAL_COMMUNITY): Payer: Self-pay | Admitting: Family Medicine

## 2019-11-21 DIAGNOSIS — F322 Major depressive disorder, single episode, severe without psychotic features: Secondary | ICD-10-CM | POA: Diagnosis not present

## 2019-11-21 DIAGNOSIS — M797 Fibromyalgia: Secondary | ICD-10-CM | POA: Diagnosis not present

## 2019-11-21 DIAGNOSIS — Z91018 Allergy to other foods: Secondary | ICD-10-CM | POA: Diagnosis not present

## 2019-11-21 DIAGNOSIS — E1169 Type 2 diabetes mellitus with other specified complication: Secondary | ICD-10-CM | POA: Diagnosis not present

## 2019-11-21 DIAGNOSIS — E785 Hyperlipidemia, unspecified: Secondary | ICD-10-CM | POA: Diagnosis not present

## 2019-11-21 MED FILL — EPINEPHRINE 0.3 MG AUTO-INJ: 0.3 | 30 days supply | Qty: 2 | Fill #0

## 2019-12-18 DIAGNOSIS — G4734 Idiopathic sleep related nonobstructive alveolar hypoventilation: Secondary | ICD-10-CM | POA: Diagnosis not present

## 2019-12-18 DIAGNOSIS — R0902 Hypoxemia: Secondary | ICD-10-CM | POA: Diagnosis not present

## 2019-12-18 DIAGNOSIS — G4733 Obstructive sleep apnea (adult) (pediatric): Secondary | ICD-10-CM | POA: Diagnosis not present

## 2020-01-05 DIAGNOSIS — G4733 Obstructive sleep apnea (adult) (pediatric): Secondary | ICD-10-CM | POA: Diagnosis not present

## 2020-01-05 DIAGNOSIS — G4734 Idiopathic sleep related nonobstructive alveolar hypoventilation: Secondary | ICD-10-CM | POA: Diagnosis not present

## 2020-01-05 DIAGNOSIS — K900 Celiac disease: Secondary | ICD-10-CM | POA: Diagnosis not present

## 2020-01-05 DIAGNOSIS — K52832 Lymphocytic colitis: Secondary | ICD-10-CM | POA: Diagnosis not present

## 2020-01-05 DIAGNOSIS — Z1211 Encounter for screening for malignant neoplasm of colon: Secondary | ICD-10-CM | POA: Diagnosis not present

## 2020-01-05 DIAGNOSIS — Z86010 Personal history of colonic polyps: Secondary | ICD-10-CM | POA: Diagnosis not present

## 2020-01-05 DIAGNOSIS — K625 Hemorrhage of anus and rectum: Secondary | ICD-10-CM | POA: Diagnosis not present

## 2020-01-05 DIAGNOSIS — K5730 Diverticulosis of large intestine without perforation or abscess without bleeding: Secondary | ICD-10-CM | POA: Diagnosis not present

## 2020-01-09 ENCOUNTER — Other Ambulatory Visit: Payer: Self-pay | Admitting: Gastroenterology

## 2020-01-17 DIAGNOSIS — G4733 Obstructive sleep apnea (adult) (pediatric): Secondary | ICD-10-CM | POA: Diagnosis not present

## 2020-01-17 DIAGNOSIS — R0902 Hypoxemia: Secondary | ICD-10-CM | POA: Diagnosis not present

## 2020-01-17 DIAGNOSIS — G4734 Idiopathic sleep related nonobstructive alveolar hypoventilation: Secondary | ICD-10-CM | POA: Diagnosis not present

## 2020-02-07 ENCOUNTER — Encounter (HOSPITAL_COMMUNITY): Payer: Self-pay | Admitting: Gastroenterology

## 2020-02-15 ENCOUNTER — Other Ambulatory Visit (HOSPITAL_COMMUNITY)
Admission: RE | Admit: 2020-02-15 | Discharge: 2020-02-15 | Disposition: A | Discharge status: Home / Self Care | Payer: Medicare HMO | Source: Ambulatory Visit | Attending: Gastroenterology | Admitting: Gastroenterology

## 2020-02-15 DIAGNOSIS — Z01812 Encounter for preprocedural laboratory examination: Secondary | ICD-10-CM | POA: Diagnosis not present

## 2020-02-15 DIAGNOSIS — Z20822 Contact with and (suspected) exposure to covid-19: Secondary | ICD-10-CM | POA: Insufficient documentation

## 2020-02-15 LAB — SARS CORONAVIRUS 2 (TAT 6-24 HRS): SARS Coronavirus 2: NEGATIVE

## 2020-02-17 ENCOUNTER — Encounter (HOSPITAL_COMMUNITY): Payer: Self-pay | Admitting: Gastroenterology

## 2020-02-17 ENCOUNTER — Ambulatory Visit (HOSPITAL_COMMUNITY)
Admission: RE | Admit: 2020-02-17 | Discharge: 2020-02-17 | Disposition: A | Discharge status: Home / Self Care | Payer: Medicare HMO | Attending: Gastroenterology | Admitting: Gastroenterology

## 2020-02-17 ENCOUNTER — Ambulatory Visit (HOSPITAL_COMMUNITY): Payer: Medicare HMO | Admitting: Anesthesiology

## 2020-02-17 ENCOUNTER — Other Ambulatory Visit: Payer: Self-pay

## 2020-02-17 ENCOUNTER — Encounter (HOSPITAL_COMMUNITY)
Admission: RE | Disposition: A | Discharge status: Home / Self Care | Payer: Self-pay | Source: Home / Self Care | Attending: Gastroenterology

## 2020-02-17 DIAGNOSIS — Z1211 Encounter for screening for malignant neoplasm of colon: Secondary | ICD-10-CM | POA: Insufficient documentation

## 2020-02-17 DIAGNOSIS — K635 Polyp of colon: Secondary | ICD-10-CM | POA: Diagnosis not present

## 2020-02-17 DIAGNOSIS — D122 Benign neoplasm of ascending colon: Secondary | ICD-10-CM | POA: Diagnosis not present

## 2020-02-17 DIAGNOSIS — E785 Hyperlipidemia, unspecified: Secondary | ICD-10-CM | POA: Diagnosis not present

## 2020-02-17 DIAGNOSIS — R0902 Hypoxemia: Secondary | ICD-10-CM | POA: Diagnosis not present

## 2020-02-17 DIAGNOSIS — Z86010 Personal history of colonic polyps: Secondary | ICD-10-CM | POA: Insufficient documentation

## 2020-02-17 DIAGNOSIS — D120 Benign neoplasm of cecum: Secondary | ICD-10-CM | POA: Diagnosis not present

## 2020-02-17 DIAGNOSIS — G4734 Idiopathic sleep related nonobstructive alveolar hypoventilation: Secondary | ICD-10-CM | POA: Diagnosis not present

## 2020-02-17 DIAGNOSIS — G4733 Obstructive sleep apnea (adult) (pediatric): Secondary | ICD-10-CM | POA: Diagnosis not present

## 2020-02-17 DIAGNOSIS — Z90710 Acquired absence of both cervix and uterus: Secondary | ICD-10-CM | POA: Diagnosis not present

## 2020-02-17 DIAGNOSIS — Z90722 Acquired absence of ovaries, bilateral: Secondary | ICD-10-CM | POA: Insufficient documentation

## 2020-02-17 DIAGNOSIS — K5730 Diverticulosis of large intestine without perforation or abscess without bleeding: Secondary | ICD-10-CM | POA: Diagnosis not present

## 2020-02-17 DIAGNOSIS — Z96653 Presence of artificial knee joint, bilateral: Secondary | ICD-10-CM | POA: Diagnosis not present

## 2020-02-17 HISTORY — PX: POLYPECTOMY: SHX5525

## 2020-02-17 HISTORY — PX: COLONOSCOPY WITH PROPOFOL: SHX5780

## 2020-02-17 LAB — GLUCOSE, CAPILLARY: Glucose-Capillary: 126 mg/dL — ABNORMAL HIGH (ref 70–99)

## 2020-02-17 SURGERY — COLONOSCOPY WITH PROPOFOL
Anesthesia: Monitor Anesthesia Care

## 2020-02-17 MED ORDER — LACTATED RINGERS IV SOLN: INTRAVENOUS | Status: DC

## 2020-02-17 MED ORDER — PROPOFOL 500 MG/50ML IV EMUL
INTRAVENOUS | Status: DC | PRN
  Administered 2020-02-17: 100 ug/kg/min via INTRAVENOUS

## 2020-02-17 MED ORDER — SODIUM CHLORIDE 0.9 % IV SOLN: INTRAVENOUS | Status: DC

## 2020-02-17 SURGICAL SUPPLY — 22 items

## 2020-02-17 NOTE — Anesthesia Postprocedure Evaluation (Signed)
Anesthesia Post Note  Patient: Mary [name]  Procedure(s) Performed: COLONOSCOPY WITH PROPOFOL (N/A ) POLYPECTOMY     Patient location during evaluation: PACU Anesthesia Type: MAC Level of consciousness: awake and alert Pain management: pain level controlled Vital Signs Assessment: post-procedure vital signs reviewed and stable Respiratory status: spontaneous breathing, nonlabored ventilation and respiratory function stable Cardiovascular status: stable and blood pressure returned to baseline Anesthetic complications: no   No complications documented.  Last Vitals:  Vitals:   02/17/20 1042 02/17/20 1050  BP: 117/62 126/77  Pulse: 72 80  Resp: (!) 25 20  Temp:    SpO2: 92% 95%    Last Pain:  Vitals:   02/17/20 1050  TempSrc:   PainSc: 0-No pain                 Audry Pili

## 2020-02-17 NOTE — Transfer of Care (Signed)
Immediate Anesthesia Transfer of Care Note  Patient: Mary [name]  Procedure(s) Performed: Procedure(s): COLONOSCOPY WITH PROPOFOL (N/A) POLYPECTOMY  Patient Location: PACU and Endoscopy Unit  Anesthesia Type:MAC  Level of Consciousness: awake, alert  and oriented  Airway & Oxygen Therapy: Patient Spontanous Breathing and Patient connected to nasal cannula oxygen  Post-op Assessment: Report given to RN and Post -op Vital signs reviewed and stable  Post vital signs: Reviewed and stable  Last Vitals:  Vitals:   02/17/20 0917  Pulse: 80  Resp: 20  Temp: 37.2 C  SpO2: 19%    Complications: No apparent anesthesia complications

## 2020-02-17 NOTE — Anesthesia Preprocedure Evaluation (Addendum)
Anesthesia Evaluation    Reviewed: Allergy & Precautions, Patient's Chart, lab work & pertinent test results  History of Anesthesia Complications (+) PONV and history of anesthetic complications  Airway Mallampati: II  TM Distance: >3 FB Neck ROM: Full    Dental  (+) Dental Advisory Given   Pulmonary neg pulmonary ROS, sleep apnea ,    Pulmonary exam normal        Cardiovascular negative cardio ROS Normal cardiovascular exam   '17 Cath - The left ventricular systolic function is normal. LV end diastolic pressure is normal. The left ventricular ejection fraction is 55-65% by visual estimate Normal coronary arteries      Neuro/Psych PSYCHIATRIC DISORDERS Depression negative neurological ROS  negative psych ROS   GI/Hepatic negative GI ROS, Neg liver ROS, GERD  Controlled, Celiac disease    Endo/Other  negative endocrine ROSMorbid obesity  Renal/GU negative Renal ROS     Musculoskeletal negative musculoskeletal ROS (+) Arthritis , Fibromyalgia -  Abdominal   Peds  Hematology negative hematology ROS (+)   Anesthesia Other Findings Covid test negative   Reproductive/Obstetrics                           Anesthesia Physical Anesthesia Plan  ASA: III  Anesthesia Plan: MAC   Post-op Pain Management:    Induction: Intravenous  PONV Risk Score and Plan: 3 and Propofol infusion and Treatment may vary due to age or medical condition  Airway Management Planned: Natural Airway and Simple Face Mask  Additional Equipment: None  Intra-op Plan:   Post-operative Plan:   Informed Consent: I have reviewed the patients History and Physical, chart, labs and discussed the procedure including the risks, benefits and alternatives for the proposed anesthesia with the patient or authorized representative who has indicated his/her understanding and acceptance.       Plan Discussed with: CRNA  and Anesthesiologist  Anesthesia Plan Comments:        Anesthesia Quick Evaluation

## 2020-02-17 NOTE — H&P (Signed)
Mary [name]   HPI: This 65 year old white female presents to the office for colorectal cancer screening. She has had problems with diarrhea over the last year. She has celiac disease and follows a gluten free diet. She has up to 2-3 loose BM's per day and has occasional fecal urgency. She has seen blood on the toilet tissue over the last 6 months. She also was found to have lymphocytic colitis in 2006 when a colonoscopy was done. She drinks diet sodas and uses artificial sweeteners in her coffee. She also suspects she has lactose intolerance. She has a good appetite and has lost 15 pounds over the last year on a low carbohydrate diet. She denies having any complaints of abdominal pain, nausea, vomiting, acid reflux, dysphagia or odynophagia. She denies having a family history of IBD. Her paternal uncle was diagnosed with colon cancer in his 51's and she had 2 maternal uncles were diagnosed with colon cancer in their 6's and 47's. Her last colonoscopy was done on 11/03/2014 which revealed sigmoid diverticulosis, a tubular adenoma was removed from the cecum and a mucosal prolapse type of polyp was removed from the sigmoid colon.    Past Medical History:  Diagnosis Date  . Anaphylaxis    Chicory root, shellfish  . Arthritis    hands, knees, neck - no meds  . Celiac disease   . Depression   . Fibromyalgia   . GERD (gastroesophageal reflux disease)    no meds  . Morbid obesity ([place]) 10/19/2015  . PONV (postoperative nausea and vomiting)   . SVD (spontaneous vaginal delivery)    x 2  . Systolic murmur 4/65/6812    Past Surgical History:  Procedure Laterality Date  . BILATERAL SALPINGECTOMY Bilateral 03/30/2013   Procedure: BILATERAL SALPINGECTOMY;  Surgeon: Thurnell Lose, MD;  Location: [facility];  Service: Gynecology;  Laterality: Bilateral;  . CARDIAC CATHETERIZATION N/A 12/07/2015   Procedure: Left Heart Cath and Coronary Angiography;  Surgeon: Leonie Man, MD;  Location: [facility];  Service: Cardiovascular;  Laterality: N/A;  . dermoid cystect    . HYSTEROSCOPY WITH D & C  01/28/2012   Procedure: DILATATION AND CURETTAGE /HYSTEROSCOPY;  Surgeon: Thurnell Lose, MD;  Location: [facility];  Service: Gynecology;  Laterality: N/A;  . LAPAROSCOPIC ASSISTED VAGINAL HYSTERECTOMY N/A 03/30/2013   Procedure: LAPAROSCOPIC ASSISTED VAGINAL HYSTERECTOMY;  Surgeon: Thurnell Lose, MD;  Location: [facility];  Service: Gynecology;  Laterality: N/A;  . REPLACEMENT TOTAL KNEE     bilateral  . SPINAL FUSION     C3-C4  . WISDOM TOOTH EXTRACTION      Family History  Problem Relation Age of Onset  . Bradycardia Mother   . Atrial fibrillation Mother   . Leukemia Father   . Breast cancer Maternal Grandmother     Social History:  reports that she has never smoked. She has never used smokeless tobacco. She reports current alcohol use. She reports that she does not use drugs.  Allergies:  Allergies  Allergen Reactions  . Aspirin Anaphylaxis and Swelling  . Chicory Root [Cichorium Intybus] Anaphylaxis, Shortness Of Breath, Itching and Swelling    Swelling of tongue and throat. Scratch throat. cough  . Nsaids Anaphylaxis and Swelling  . Shellfish Allergy Anaphylaxis and Swelling  . Celebrex [Celecoxib] Swelling    Facial swelling    Medications:  Scheduled:  Continuous: . sodium chloride    . lactated ringers      No results found for this  or any previous visit (from the past 24 hour(s)).   No results found.  ROS:  As stated above in the HPI otherwise negative.  Height 5\' 3"  (1.6 m), weight 106.6 kg.    PE: Gen: NAD, Alert and Oriented HEENT:  [place]/AT, EOMI Neck: Supple, no LAD Lungs: CTA Bilaterally CV: RRR without M/G/R ABD: Soft, NTND, +BS Ext: No C/C/E  Assessment/Plan: 1) Personal history of polyps - colonoscopy.  Khiem Gargis D 02/17/2020, 9:19 AM

## 2020-02-17 NOTE — Op Note (Signed)
[facility] Patient Name: Mary [name] Procedure Date: 02/17/2020 MRN: [id number] Attending MD: Carol Ada , MD Date of Birth: [birth year] CSN: [id number] Age: 65 Admit Type: Outpatient Procedure:                Colonoscopy Indications:              High risk colon cancer surveillance: Personal                            history of colonic polyps Providers:                Carol Ada, MD, Jeanella Cara, RN,                            Nelia Shi, RN, William Dalton, Technician Referring MD:              Medicines:                Propofol per Anesthesia Complications:            No immediate complications. Estimated Blood Loss:     Estimated blood loss: none. Procedure:                Pre-Anesthesia Assessment:                           - Prior to the procedure, a History and Physical                            was performed, and patient medications and                            allergies were reviewed. The patient's tolerance of                            previous anesthesia was also reviewed. The risks                            and benefits of the procedure and the sedation                            options and risks were discussed with the patient.                            All questions were answered, and informed consent                            was obtained. Prior Anticoagulants: The patient has                            taken no previous anticoagulant or antiplatelet                            agents. ASA Grade Assessment: III - A patient with  severe systemic disease. After reviewing the risks                            and benefits, the patient was deemed in                            satisfactory condition to undergo the procedure.                           - Sedation was administered by an anesthesia                            professional. Deep sedation was attained.                           After obtaining  informed consent, the colonoscope                            was passed under direct vision. Throughout the                            procedure, the patient's blood pressure, pulse, and                            oxygen saturations were monitored continuously. The                            [id number] Olympus colonoscope was                            introduced through the anus and advanced to the the                            cecum, identified by appendiceal orifice and                            ileocecal valve. The colonoscopy was performed                            without difficulty. The patient tolerated the                            procedure well. The quality of the bowel                            preparation was excellent. The ileocecal valve,                            appendiceal orifice, and rectum were photographed. Scope In: 10:04:26 AM Scope Out: 10:27:00 AM Scope Withdrawal Time: 0 hours 12 minutes 49 seconds  Total Procedure Duration: 0 hours 22 minutes 34 seconds  Findings:      Two sessile polyps were found in the ascending colon and cecum. The       polyps were 2 to 3 mm in size.  These polyps were removed with a cold       snare. Resection and retrieval were complete.      Scattered small-mouthed diverticula were found in the sigmoid colon. Impression:               - Two 2 to 3 mm polyps in the ascending colon and                            in the cecum, removed with a cold snare. Resected                            and retrieved. Moderate Sedation:      Not Applicable - Patient had care per Anesthesia. Recommendation:           - Patient has a contact number available for                            emergencies. The signs and symptoms of potential                            delayed complications were discussed with the                            patient. Return to normal activities tomorrow.                            Written discharge instructions  were provided to the                            patient.                           - Resume previous diet.                           - Continue present medications.                           - Await pathology results.                           - Repeat colonoscopy in 7 years for surveillance. Procedure Code(s):        --- Professional ---                           [id number], Colonoscopy, flexible; with removal of                            tumor(s), polyp(s), or other lesion(s) by snare                            technique Diagnosis Code(s):        --- Professional ---                           K63.5, Polyp of colon  Z86.010, Personal history of colonic polyps CPT copyright 2019 [employer]. All rights reserved. The codes documented in this report are preliminary and upon coder review may  be revised to meet current compliance requirements. Carol Ada, MD Carol Ada, MD 02/17/2020 10:35:32 AM This report has been signed electronically. Number of Addenda: 0

## 2020-02-17 NOTE — Discharge Instructions (Signed)

## 2020-02-20 ENCOUNTER — Encounter (HOSPITAL_COMMUNITY): Payer: Self-pay | Admitting: Gastroenterology

## 2020-02-20 LAB — SURGICAL PATHOLOGY

## 2020-03-19 DIAGNOSIS — G4733 Obstructive sleep apnea (adult) (pediatric): Secondary | ICD-10-CM | POA: Diagnosis not present

## 2020-03-19 DIAGNOSIS — R0902 Hypoxemia: Secondary | ICD-10-CM | POA: Diagnosis not present

## 2020-03-19 DIAGNOSIS — G4734 Idiopathic sleep related nonobstructive alveolar hypoventilation: Secondary | ICD-10-CM | POA: Diagnosis not present

## 2020-04-10 DIAGNOSIS — G4733 Obstructive sleep apnea (adult) (pediatric): Secondary | ICD-10-CM | POA: Diagnosis not present

## 2020-04-10 DIAGNOSIS — G4734 Idiopathic sleep related nonobstructive alveolar hypoventilation: Secondary | ICD-10-CM | POA: Diagnosis not present

## 2020-04-16 DIAGNOSIS — G4734 Idiopathic sleep related nonobstructive alveolar hypoventilation: Secondary | ICD-10-CM | POA: Diagnosis not present

## 2020-04-16 DIAGNOSIS — G4733 Obstructive sleep apnea (adult) (pediatric): Secondary | ICD-10-CM | POA: Diagnosis not present

## 2020-04-16 DIAGNOSIS — R0902 Hypoxemia: Secondary | ICD-10-CM | POA: Diagnosis not present

## 2020-04-27 ENCOUNTER — Other Ambulatory Visit (HOSPITAL_COMMUNITY): Payer: Self-pay

## 2020-04-27 MED ORDER — FREESTYLE LANCETS MISC
3 refills | Status: AC
  Filled 2020-04-27: qty 100, 90d supply, fill #0

## 2020-04-27 MED ORDER — FREESTYLE LITE TEST VI STRP
ORAL_STRIP | 3 refills | Status: DC
  Filled 2020-04-27: qty 100, 90d supply, fill #0

## 2020-04-30 ENCOUNTER — Other Ambulatory Visit (HOSPITAL_COMMUNITY): Payer: Self-pay

## 2020-04-30 MED ORDER — FREESTYLE LITE TEST VI STRP
ORAL_STRIP | 3 refills | Status: DC
  Filled 2020-04-30: qty 100, 90d supply, fill #0
  Filled 2020-12-02: qty 100, 90d supply, fill #1

## 2020-05-01 ENCOUNTER — Other Ambulatory Visit (HOSPITAL_COMMUNITY): Payer: Self-pay

## 2020-05-03 ENCOUNTER — Other Ambulatory Visit (HOSPITAL_COMMUNITY): Payer: Self-pay

## 2020-05-17 DIAGNOSIS — G4733 Obstructive sleep apnea (adult) (pediatric): Secondary | ICD-10-CM | POA: Diagnosis not present

## 2020-05-17 DIAGNOSIS — G4734 Idiopathic sleep related nonobstructive alveolar hypoventilation: Secondary | ICD-10-CM | POA: Diagnosis not present

## 2020-05-17 DIAGNOSIS — R0902 Hypoxemia: Secondary | ICD-10-CM | POA: Diagnosis not present

## 2020-05-31 DIAGNOSIS — E1169 Type 2 diabetes mellitus with other specified complication: Secondary | ICD-10-CM | POA: Diagnosis not present

## 2020-05-31 DIAGNOSIS — Z7984 Long term (current) use of oral hypoglycemic drugs: Secondary | ICD-10-CM | POA: Diagnosis not present

## 2020-05-31 DIAGNOSIS — E785 Hyperlipidemia, unspecified: Secondary | ICD-10-CM | POA: Diagnosis not present

## 2020-05-31 DIAGNOSIS — M2550 Pain in unspecified joint: Secondary | ICD-10-CM | POA: Diagnosis not present

## 2020-05-31 DIAGNOSIS — F322 Major depressive disorder, single episode, severe without psychotic features: Secondary | ICD-10-CM | POA: Diagnosis not present

## 2020-05-31 DIAGNOSIS — Z0000 Encounter for general adult medical examination without abnormal findings: Secondary | ICD-10-CM | POA: Diagnosis not present

## 2020-05-31 DIAGNOSIS — E559 Vitamin D deficiency, unspecified: Secondary | ICD-10-CM | POA: Diagnosis not present

## 2020-06-06 DIAGNOSIS — E1169 Type 2 diabetes mellitus with other specified complication: Secondary | ICD-10-CM | POA: Diagnosis not present

## 2020-06-06 DIAGNOSIS — Z0000 Encounter for general adult medical examination without abnormal findings: Secondary | ICD-10-CM | POA: Diagnosis not present

## 2020-06-06 DIAGNOSIS — E559 Vitamin D deficiency, unspecified: Secondary | ICD-10-CM | POA: Diagnosis not present

## 2020-06-06 DIAGNOSIS — E785 Hyperlipidemia, unspecified: Secondary | ICD-10-CM | POA: Diagnosis not present

## 2020-06-06 DIAGNOSIS — Z7984 Long term (current) use of oral hypoglycemic drugs: Secondary | ICD-10-CM | POA: Diagnosis not present

## 2020-06-06 DIAGNOSIS — Z23 Encounter for immunization: Secondary | ICD-10-CM | POA: Diagnosis not present

## 2020-06-11 DIAGNOSIS — M5441 Lumbago with sciatica, right side: Secondary | ICD-10-CM | POA: Diagnosis not present

## 2020-06-11 DIAGNOSIS — M5442 Lumbago with sciatica, left side: Secondary | ICD-10-CM | POA: Diagnosis not present

## 2020-06-11 DIAGNOSIS — M25512 Pain in left shoulder: Secondary | ICD-10-CM | POA: Diagnosis not present

## 2020-06-11 DIAGNOSIS — G8929 Other chronic pain: Secondary | ICD-10-CM | POA: Diagnosis not present

## 2020-06-11 DIAGNOSIS — M79644 Pain in right finger(s): Secondary | ICD-10-CM | POA: Diagnosis not present

## 2020-06-11 DIAGNOSIS — M79645 Pain in left finger(s): Secondary | ICD-10-CM | POA: Diagnosis not present

## 2020-06-16 DIAGNOSIS — G4733 Obstructive sleep apnea (adult) (pediatric): Secondary | ICD-10-CM | POA: Diagnosis not present

## 2020-06-16 DIAGNOSIS — R0902 Hypoxemia: Secondary | ICD-10-CM | POA: Diagnosis not present

## 2020-06-16 DIAGNOSIS — G4734 Idiopathic sleep related nonobstructive alveolar hypoventilation: Secondary | ICD-10-CM | POA: Diagnosis not present

## 2020-06-28 ENCOUNTER — Ambulatory Visit: Payer: Medicare HMO | Admitting: Adult Health

## 2020-07-02 ENCOUNTER — Other Ambulatory Visit: Payer: Self-pay

## 2020-07-02 ENCOUNTER — Ambulatory Visit (INDEPENDENT_AMBULATORY_CARE_PROVIDER_SITE_OTHER): Payer: Medicare HMO | Admitting: Adult Health

## 2020-07-02 ENCOUNTER — Encounter: Payer: Self-pay | Admitting: Adult Health

## 2020-07-02 DIAGNOSIS — J9611 Chronic respiratory failure with hypoxia: Secondary | ICD-10-CM | POA: Diagnosis not present

## 2020-07-02 DIAGNOSIS — G4733 Obstructive sleep apnea (adult) (pediatric): Secondary | ICD-10-CM

## 2020-07-02 NOTE — Assessment & Plan Note (Signed)
Chronic respiratory failure on nocturnal oxygen at nighttime with CPAP.  Patient has underlying OSA and OHS.  Continue on O2 at bedtime with CPAP at 2 L

## 2020-07-02 NOTE — Assessment & Plan Note (Signed)
Excellent control and compliance on nocturnal CPAP.  No changes Patient education given on CPAP and OSA  Plan  Patient Instructions  Keep up good work .  Continue on CPAP At bedtime with Oxygen 2l/m .  Work on healthy weight  Do not drive if sleepy  Follow with Dr. Halford Chessman  Or Grady Mohabir NP in 1 year and As needed

## 2020-07-02 NOTE — Patient Instructions (Signed)
Keep up good work .  Continue on CPAP At bedtime with Oxygen 2l/m .  Work on healthy weight  Do not drive if sleepy  Follow with Dr. Halford Chessman  Or Bradon Fester NP in 1 year and As needed

## 2020-07-02 NOTE — Progress Notes (Signed)
Reviewed and agree with assessment/plan.   Chesley Mires, MD [facility] Pulmonary/Critical Care 07/02/2020, 12:29 PM Pager:  [phone]

## 2020-07-02 NOTE — Progress Notes (Signed)
@Patient  ID: Mary [name], female    DOB: [birth year], 65 y.o.   MRN: [id number]  Chief Complaint  Patient presents with   Follow-up    Referring provider: Harlan Stains, MD  HPI: 65 year old female followed for obstructive sleep apnea and OHS on CPAP with oxygen at 2 L Patient is retired from [employer] she worked at [employer].  Did the registrar for birth certificate's  TEST/EVENTS :  PSG 08/17/15 >> AHI 6.1, SpO2 low 89% ONO with 2 liters 12/20/15 >> test time 5 hrs 34 min.  Basal SpO2 94%, low SpO2 83%.  Spent 8 min with SpO2 < 88%. CPAP titration 05/15/16 >> CPAP 10 cm H2O >> AHI 1.2.  Did not need supplemental oxygen. HST 06/12/16 >> 17.2, SaO2 low 82% CPAP 05/17/17 to 06/15/17 >> used on 30 of 30 nights with average 8 hrs 11 min.  Average AHI 3.6 with CPAP 10 cm H2O  ONO with CPAP and 2 liters 10/23/16 >> test time 3 hrs 25 min.  Average SpO2 92%, low SpO2 89%.     Cardiac tests Echo 11/06/15 >> EF 55 to 60%, grade 2 DD  07/02/2020 Follow up : OSA/OHS  Patient returns for a 1 year follow up. Remains on CPAP with Oxygen 2l/m .  She wears CPAP every single night.  Says she sleeps better with it.  Feels that since starting CPAP she feels better. Usually gets in about 7 to 8 hours of sleep on her CPAP each night.  She uses oxygen at 2 L each night.  CPAP download shows excellent compliance with daily average usage at 7.5 hours.  She has 100% compliance.  She is on CPAP 10 cm H2O.  AHI is 2.6.  Minimal leaks. Patient education on CPAP and OSA.  We did discuss healthy weight loss.  Brother passed away this morning . Lung transplant patient.    Allergies  Allergen Reactions   Aspirin Anaphylaxis and Swelling   Chicory Root [Cichorium Intybus] Anaphylaxis, Shortness Of Breath, Itching and Swelling    Swelling of tongue and throat. Scratch throat. cough   Nsaids Anaphylaxis and Swelling   Shellfish Allergy Anaphylaxis and Swelling   Celebrex [Celecoxib] Swelling     Facial swelling    Immunization History  Administered Date(s) Administered   Influenza Split 10/25/2015, 10/18/2016, 10/19/2017   Influenza,inj,quad, With Preservative 10/22/2016   PFIZER(Purple Top)SARS-COV-2 Vaccination 02/28/2019, 03/21/2019   Zoster Recombinat (Shingrix) 02/26/2018, 09/02/2018    Past Medical History:  Diagnosis Date   Anaphylaxis    Chicory root, shellfish   Arthritis    hands, knees, neck - no meds   Celiac disease    Depression    Fibromyalgia    GERD (gastroesophageal reflux disease)    no meds   Morbid obesity ([place]) 10/19/2015   PONV (postoperative nausea and vomiting)    SVD (spontaneous vaginal delivery)    x 2   Systolic murmur 01/21/7508    Tobacco History: Social History   Tobacco Use  Smoking Status Never  Smokeless Tobacco Never   Counseling given: Not Answered   Outpatient Medications Prior to Visit  Medication Sig Dispense Refill   acetaminophen (TYLENOL) 500 MG tablet Take 1,000 mg by mouth daily as needed for headache.     amoxicillin (AMOXIL) 500 MG capsule TAKE 4 CAPSULES BY MOUTH 1 HOUR PRIOR TO DENTAL APPOINTMENT. 8 capsule 99   Ascorbic Acid (VITAMIN C PO) Take 1 tablet by mouth daily.     atorvastatin (  LIPITOR) 20 MG tablet Take 20 mg by mouth daily.     Cholecalciferol (VITAMIN D3 PO) Take 1 capsule by mouth daily.     Cyanocobalamin (B-12 PO) Take 1 capsule by mouth daily.     diclofenac sodium (VOLTAREN) 1 % GEL Apply 2 g topically daily as needed (for pain).     diphenhydrAMINE (BENADRYL) 25 MG tablet Take 25 mg by mouth 2 (two) times daily as needed for sleep, allergies or itching.     DULoxetine (CYMBALTA) 60 MG capsule Take 60 mg by mouth at bedtime.      EPINEPHrine 0.3 mg/0.3 mL IJ SOAJ injection Inject 0.3 mg into the muscle once as needed (allergic reaction).      EPINEPHrine 0.3 mg/0.3 mL IJ SOAJ injection INJECT AS DIRECTED IF LIFE THREATENING REACTION OCCURS 2 each 5   glucose blood (FREESTYLE LITE) test strip  Use to check blood sugar daily 100 strip 3   glucose blood (FREESTYLE LITE) test strip Use to test your blood sugar daily 100 strip 3   Lancets (FREESTYLE) lancets Use to check blood sugar daily 100 each 3   metFORMIN (GLUCOPHAGE-XR) 500 MG 24 hr tablet Take 2,000 mg by mouth daily with breakfast.     Omega-3 Fatty Acids (FISH OIL PO) Take 2 capsules by mouth daily.     No facility-administered medications prior to visit.     Review of Systems:   Constitutional:   No  weight loss, night sweats,  Fevers, chills, fatigue, or  lassitude.  HEENT:   No headaches,  Difficulty swallowing,  Tooth/dental problems, or  Sore throat,                No sneezing, itching, ear ache, nasal congestion, post nasal drip,   CV:  No chest pain,  Orthopnea, PND, swelling in lower extremities, anasarca, dizziness, palpitations, syncope.   GI  No heartburn, indigestion, abdominal pain, nausea, vomiting, diarrhea, change in bowel habits, loss of appetite, bloody stools.   Resp: No shortness of breath with exertion or at rest.  No excess mucus, no productive cough,  No non-productive cough,  No coughing up of blood.  No change in color of mucus.  No wheezing.  No chest wall deformity  Skin: no rash or lesions.  GU: no dysuria, change in color of urine, no urgency or frequency.  No flank pain, no hematuria   MS:  No joint pain or swelling.  No decreased range of motion.  No back pain.    Physical Exam  BP 126/84   Pulse 74   Temp 97.8 F (36.6 C) (Temporal)   Ht 5\' 3"  (1.6 m)   Wt 245 lb (111.1 kg)   SpO2 98%   BMI 43.40 kg/m   GEN: A/Ox3; pleasant , NAD, well nourished    HEENT:  [place]/AT,   NOSE-clear, THROAT-clear, no lesions, no postnasal drip or exudate noted.  Class 2 MP airway   NECK:  Supple w/ fair ROM; no JVD; normal carotid impulses w/o bruits; no thyromegaly or nodules palpated; no lymphadenopathy.    RESP  Clear  P & A; w/o, wheezes/ rales/ or rhonchi. no accessory muscle use, no  dullness to percussion  CARD:  RRR, no m/r/g, no peripheral edema, pulses intact, no cyanosis or clubbing.  GI:   Soft & nt; nml bowel sounds; no organomegaly or masses detected.   Musco: Warm bil, no deformities or joint swelling noted.   Neuro: alert, no focal deficits noted.  Skin: Warm, no lesions or rashes    Lab Results:  CBC     BNP No results found for: BNP  ProBNP No results found for: PROBNP  Imaging: No results found.    No flowsheet data found.  No results found for: NITRICOXIDE      Assessment & Plan:   OSA (obstructive sleep apnea) Excellent control and compliance on nocturnal CPAP.  No changes Patient education given on CPAP and OSA  Plan  Patient Instructions  Keep up good work .  Continue on CPAP At bedtime with Oxygen 2l/m .  Work on healthy weight  Do not drive if sleepy  Follow with Dr. Halford Chessman  Or Miranda Frese NP in 1 year and As needed       Morbid obesity ([place]) Healthy weight loss discussed  Chronic respiratory failure with hypoxia ([place]) Chronic respiratory failure on nocturnal oxygen at nighttime with CPAP.  Patient has underlying OSA and OHS.  Continue on O2 at bedtime with CPAP at 2 L    I spent   30 minutes dedicated to the care of this patient on the date of this encounter to include pre-visit review of records, face-to-face time with the patient discussing conditions above, post visit ordering of testing, clinical documentation with the electronic health record, making appropriate referrals as documented, and communicating necessary findings to members of the patients care team.    Rexene Edison, NP 07/02/2020

## 2020-07-02 NOTE — Assessment & Plan Note (Signed)
Healthy weight loss discussed 

## 2020-07-10 DIAGNOSIS — G4733 Obstructive sleep apnea (adult) (pediatric): Secondary | ICD-10-CM | POA: Diagnosis not present

## 2020-07-10 DIAGNOSIS — G4734 Idiopathic sleep related nonobstructive alveolar hypoventilation: Secondary | ICD-10-CM | POA: Diagnosis not present

## 2020-07-17 DIAGNOSIS — G4734 Idiopathic sleep related nonobstructive alveolar hypoventilation: Secondary | ICD-10-CM | POA: Diagnosis not present

## 2020-07-17 DIAGNOSIS — R0902 Hypoxemia: Secondary | ICD-10-CM | POA: Diagnosis not present

## 2020-07-17 DIAGNOSIS — G4733 Obstructive sleep apnea (adult) (pediatric): Secondary | ICD-10-CM | POA: Diagnosis not present

## 2020-08-16 DIAGNOSIS — G4733 Obstructive sleep apnea (adult) (pediatric): Secondary | ICD-10-CM | POA: Diagnosis not present

## 2020-08-16 DIAGNOSIS — G4734 Idiopathic sleep related nonobstructive alveolar hypoventilation: Secondary | ICD-10-CM | POA: Diagnosis not present

## 2020-08-16 DIAGNOSIS — R0902 Hypoxemia: Secondary | ICD-10-CM | POA: Diagnosis not present

## 2020-09-16 DIAGNOSIS — R0902 Hypoxemia: Secondary | ICD-10-CM | POA: Diagnosis not present

## 2020-09-16 DIAGNOSIS — G4733 Obstructive sleep apnea (adult) (pediatric): Secondary | ICD-10-CM | POA: Diagnosis not present

## 2020-09-16 DIAGNOSIS — G4734 Idiopathic sleep related nonobstructive alveolar hypoventilation: Secondary | ICD-10-CM | POA: Diagnosis not present

## 2020-10-04 ENCOUNTER — Other Ambulatory Visit (HOSPITAL_COMMUNITY): Payer: Self-pay

## 2020-10-04 DIAGNOSIS — E119 Type 2 diabetes mellitus without complications: Secondary | ICD-10-CM | POA: Diagnosis not present

## 2020-10-04 DIAGNOSIS — Z0100 Encounter for examination of eyes and vision without abnormal findings: Secondary | ICD-10-CM | POA: Diagnosis not present

## 2020-10-04 DIAGNOSIS — G5701 Lesion of sciatic nerve, right lower limb: Secondary | ICD-10-CM | POA: Diagnosis not present

## 2020-10-04 MED ORDER — METHOCARBAMOL 750 MG PO TABS
ORAL_TABLET | ORAL | 0 refills | Status: DC
  Filled 2020-10-04: qty 15, 5d supply, fill #0

## 2020-10-04 MED ORDER — PREDNISONE 20 MG PO TABS
ORAL_TABLET | ORAL | 0 refills | Status: DC
  Filled 2020-10-04: qty 10, 5d supply, fill #0

## 2020-10-06 ENCOUNTER — Other Ambulatory Visit (HOSPITAL_COMMUNITY): Payer: Self-pay

## 2020-10-06 MED ORDER — HYDROCODONE-ACETAMINOPHEN 5-325 MG PO TABS
1.0000 | ORAL_TABLET | Freq: Three times a day (TID) | ORAL | 0 refills | Status: DC
  Filled 2020-10-06: qty 9, 3d supply, fill #0

## 2020-10-10 DIAGNOSIS — G4733 Obstructive sleep apnea (adult) (pediatric): Secondary | ICD-10-CM | POA: Diagnosis not present

## 2020-10-10 DIAGNOSIS — G4734 Idiopathic sleep related nonobstructive alveolar hypoventilation: Secondary | ICD-10-CM | POA: Diagnosis not present

## 2020-10-12 ENCOUNTER — Other Ambulatory Visit (HOSPITAL_COMMUNITY): Payer: Self-pay

## 2020-10-12 DIAGNOSIS — M5431 Sciatica, right side: Secondary | ICD-10-CM | POA: Diagnosis not present

## 2020-10-12 DIAGNOSIS — M461 Sacroiliitis, not elsewhere classified: Secondary | ICD-10-CM | POA: Diagnosis not present

## 2020-10-12 MED ORDER — OXYCODONE-ACETAMINOPHEN 5-325 MG PO TABS
ORAL_TABLET | ORAL | 0 refills | Status: DC
  Filled 2020-10-12: qty 20, 5d supply, fill #0

## 2020-10-12 MED ORDER — PREDNISONE 20 MG PO TABS
ORAL_TABLET | ORAL | 0 refills | Status: DC
  Filled 2020-10-12: qty 18, 9d supply, fill #0

## 2020-10-12 MED ORDER — CYCLOBENZAPRINE HCL 10 MG PO TABS
ORAL_TABLET | ORAL | 0 refills | Status: DC
  Filled 2020-10-12: qty 30, 10d supply, fill #0

## 2020-10-16 DIAGNOSIS — M5431 Sciatica, right side: Secondary | ICD-10-CM | POA: Diagnosis not present

## 2020-10-16 DIAGNOSIS — R262 Difficulty in walking, not elsewhere classified: Secondary | ICD-10-CM | POA: Diagnosis not present

## 2020-10-16 DIAGNOSIS — E668 Other obesity: Secondary | ICD-10-CM | POA: Diagnosis not present

## 2020-10-16 DIAGNOSIS — M799 Soft tissue disorder, unspecified: Secondary | ICD-10-CM | POA: Diagnosis not present

## 2020-10-16 DIAGNOSIS — M6281 Muscle weakness (generalized): Secondary | ICD-10-CM | POA: Diagnosis not present

## 2020-10-16 DIAGNOSIS — M797 Fibromyalgia: Secondary | ICD-10-CM | POA: Diagnosis not present

## 2020-10-17 DIAGNOSIS — M799 Soft tissue disorder, unspecified: Secondary | ICD-10-CM | POA: Diagnosis not present

## 2020-10-17 DIAGNOSIS — G4733 Obstructive sleep apnea (adult) (pediatric): Secondary | ICD-10-CM | POA: Diagnosis not present

## 2020-10-17 DIAGNOSIS — G4734 Idiopathic sleep related nonobstructive alveolar hypoventilation: Secondary | ICD-10-CM | POA: Diagnosis not present

## 2020-10-17 DIAGNOSIS — M797 Fibromyalgia: Secondary | ICD-10-CM | POA: Diagnosis not present

## 2020-10-17 DIAGNOSIS — M5431 Sciatica, right side: Secondary | ICD-10-CM | POA: Diagnosis not present

## 2020-10-17 DIAGNOSIS — R262 Difficulty in walking, not elsewhere classified: Secondary | ICD-10-CM | POA: Diagnosis not present

## 2020-10-17 DIAGNOSIS — R0902 Hypoxemia: Secondary | ICD-10-CM | POA: Diagnosis not present

## 2020-10-17 DIAGNOSIS — M6281 Muscle weakness (generalized): Secondary | ICD-10-CM | POA: Diagnosis not present

## 2020-10-17 DIAGNOSIS — E668 Other obesity: Secondary | ICD-10-CM | POA: Diagnosis not present

## 2020-10-22 DIAGNOSIS — M6281 Muscle weakness (generalized): Secondary | ICD-10-CM | POA: Diagnosis not present

## 2020-10-22 DIAGNOSIS — M797 Fibromyalgia: Secondary | ICD-10-CM | POA: Diagnosis not present

## 2020-10-22 DIAGNOSIS — M799 Soft tissue disorder, unspecified: Secondary | ICD-10-CM | POA: Diagnosis not present

## 2020-10-22 DIAGNOSIS — R262 Difficulty in walking, not elsewhere classified: Secondary | ICD-10-CM | POA: Diagnosis not present

## 2020-10-22 DIAGNOSIS — E668 Other obesity: Secondary | ICD-10-CM | POA: Diagnosis not present

## 2020-10-22 DIAGNOSIS — M5431 Sciatica, right side: Secondary | ICD-10-CM | POA: Diagnosis not present

## 2020-10-23 ENCOUNTER — Other Ambulatory Visit (HOSPITAL_COMMUNITY): Payer: Self-pay

## 2020-10-23 ENCOUNTER — Other Ambulatory Visit: Payer: Self-pay | Admitting: Family Medicine

## 2020-10-23 DIAGNOSIS — M5431 Sciatica, right side: Secondary | ICD-10-CM

## 2020-10-23 MED FILL — Amoxicillin (Trihydrate) Cap 500 MG: ORAL | 2 days supply | Qty: 8 | Fill #0 | Status: AC

## 2020-10-24 ENCOUNTER — Ambulatory Visit
Admission: RE | Admit: 2020-10-24 | Discharge: 2020-10-24 | Disposition: A | Discharge status: Home / Self Care | Payer: Medicare HMO | Source: Ambulatory Visit | Attending: Family Medicine | Admitting: Family Medicine

## 2020-10-24 ENCOUNTER — Other Ambulatory Visit (HOSPITAL_COMMUNITY): Payer: Self-pay

## 2020-10-24 DIAGNOSIS — M48061 Spinal stenosis, lumbar region without neurogenic claudication: Secondary | ICD-10-CM | POA: Diagnosis not present

## 2020-10-24 DIAGNOSIS — M545 Low back pain, unspecified: Secondary | ICD-10-CM | POA: Diagnosis not present

## 2020-10-24 DIAGNOSIS — M5431 Sciatica, right side: Secondary | ICD-10-CM

## 2020-10-25 ENCOUNTER — Other Ambulatory Visit (HOSPITAL_COMMUNITY): Payer: Self-pay

## 2020-10-25 DIAGNOSIS — L918 Other hypertrophic disorders of the skin: Secondary | ICD-10-CM | POA: Diagnosis not present

## 2020-10-25 DIAGNOSIS — L718 Other rosacea: Secondary | ICD-10-CM | POA: Diagnosis not present

## 2020-10-25 DIAGNOSIS — D1801 Hemangioma of skin and subcutaneous tissue: Secondary | ICD-10-CM | POA: Diagnosis not present

## 2020-10-25 DIAGNOSIS — D225 Melanocytic nevi of trunk: Secondary | ICD-10-CM | POA: Diagnosis not present

## 2020-10-25 MED ORDER — METRONIDAZOLE 0.75 % EX CREA
TOPICAL_CREAM | CUTANEOUS | 3 refills | Status: DC
  Filled 2020-10-25: qty 45, 30d supply, fill #0
  Filled 2021-04-19: qty 45, 30d supply, fill #1

## 2020-10-26 ENCOUNTER — Other Ambulatory Visit (HOSPITAL_COMMUNITY): Payer: Self-pay

## 2020-10-26 DIAGNOSIS — M799 Soft tissue disorder, unspecified: Secondary | ICD-10-CM | POA: Diagnosis not present

## 2020-10-26 DIAGNOSIS — M797 Fibromyalgia: Secondary | ICD-10-CM | POA: Diagnosis not present

## 2020-10-26 DIAGNOSIS — R262 Difficulty in walking, not elsewhere classified: Secondary | ICD-10-CM | POA: Diagnosis not present

## 2020-10-26 DIAGNOSIS — M5431 Sciatica, right side: Secondary | ICD-10-CM | POA: Diagnosis not present

## 2020-10-26 DIAGNOSIS — M6281 Muscle weakness (generalized): Secondary | ICD-10-CM | POA: Diagnosis not present

## 2020-10-26 DIAGNOSIS — E668 Other obesity: Secondary | ICD-10-CM | POA: Diagnosis not present

## 2020-10-26 MED ORDER — GABAPENTIN 100 MG PO CAPS
100.0000 mg | ORAL_CAPSULE | Freq: Every evening | ORAL | 2 refills | Status: DC
  Filled 2020-10-26: qty 90, 30d supply, fill #0

## 2020-10-29 DIAGNOSIS — M799 Soft tissue disorder, unspecified: Secondary | ICD-10-CM | POA: Diagnosis not present

## 2020-10-29 DIAGNOSIS — E668 Other obesity: Secondary | ICD-10-CM | POA: Diagnosis not present

## 2020-10-29 DIAGNOSIS — M797 Fibromyalgia: Secondary | ICD-10-CM | POA: Diagnosis not present

## 2020-10-29 DIAGNOSIS — M6281 Muscle weakness (generalized): Secondary | ICD-10-CM | POA: Diagnosis not present

## 2020-10-29 DIAGNOSIS — R262 Difficulty in walking, not elsewhere classified: Secondary | ICD-10-CM | POA: Diagnosis not present

## 2020-10-29 DIAGNOSIS — M5431 Sciatica, right side: Secondary | ICD-10-CM | POA: Diagnosis not present

## 2020-11-01 ENCOUNTER — Other Ambulatory Visit (HOSPITAL_COMMUNITY): Payer: Self-pay

## 2020-11-01 DIAGNOSIS — K52832 Lymphocytic colitis: Secondary | ICD-10-CM | POA: Diagnosis not present

## 2020-11-01 DIAGNOSIS — K900 Celiac disease: Secondary | ICD-10-CM | POA: Diagnosis not present

## 2020-11-01 DIAGNOSIS — Z86010 Personal history of colonic polyps: Secondary | ICD-10-CM | POA: Diagnosis not present

## 2020-11-01 DIAGNOSIS — K5730 Diverticulosis of large intestine without perforation or abscess without bleeding: Secondary | ICD-10-CM | POA: Diagnosis not present

## 2020-11-01 MED ORDER — BUDESONIDE 3 MG PO CPEP
ORAL_CAPSULE | ORAL | 12 refills | Status: DC
  Filled 2020-11-01: qty 90, 30d supply, fill #0

## 2020-11-02 ENCOUNTER — Other Ambulatory Visit (HOSPITAL_COMMUNITY): Payer: Self-pay

## 2020-11-05 ENCOUNTER — Other Ambulatory Visit (HOSPITAL_COMMUNITY): Payer: Self-pay

## 2020-11-06 ENCOUNTER — Other Ambulatory Visit (HOSPITAL_COMMUNITY): Payer: Self-pay

## 2020-11-09 DIAGNOSIS — R262 Difficulty in walking, not elsewhere classified: Secondary | ICD-10-CM | POA: Diagnosis not present

## 2020-11-09 DIAGNOSIS — M797 Fibromyalgia: Secondary | ICD-10-CM | POA: Diagnosis not present

## 2020-11-09 DIAGNOSIS — M799 Soft tissue disorder, unspecified: Secondary | ICD-10-CM | POA: Diagnosis not present

## 2020-11-09 DIAGNOSIS — M6281 Muscle weakness (generalized): Secondary | ICD-10-CM | POA: Diagnosis not present

## 2020-11-09 DIAGNOSIS — M5431 Sciatica, right side: Secondary | ICD-10-CM | POA: Diagnosis not present

## 2020-11-09 DIAGNOSIS — E668 Other obesity: Secondary | ICD-10-CM | POA: Diagnosis not present

## 2020-11-12 DIAGNOSIS — M5431 Sciatica, right side: Secondary | ICD-10-CM | POA: Diagnosis not present

## 2020-11-12 DIAGNOSIS — M799 Soft tissue disorder, unspecified: Secondary | ICD-10-CM | POA: Diagnosis not present

## 2020-11-12 DIAGNOSIS — M6281 Muscle weakness (generalized): Secondary | ICD-10-CM | POA: Diagnosis not present

## 2020-11-12 DIAGNOSIS — E668 Other obesity: Secondary | ICD-10-CM | POA: Diagnosis not present

## 2020-11-12 DIAGNOSIS — M48061 Spinal stenosis, lumbar region without neurogenic claudication: Secondary | ICD-10-CM | POA: Diagnosis not present

## 2020-11-12 DIAGNOSIS — R262 Difficulty in walking, not elsewhere classified: Secondary | ICD-10-CM | POA: Diagnosis not present

## 2020-11-12 DIAGNOSIS — M797 Fibromyalgia: Secondary | ICD-10-CM | POA: Diagnosis not present

## 2020-11-16 DIAGNOSIS — G4733 Obstructive sleep apnea (adult) (pediatric): Secondary | ICD-10-CM | POA: Diagnosis not present

## 2020-11-16 DIAGNOSIS — R0902 Hypoxemia: Secondary | ICD-10-CM | POA: Diagnosis not present

## 2020-11-16 DIAGNOSIS — G4734 Idiopathic sleep related nonobstructive alveolar hypoventilation: Secondary | ICD-10-CM | POA: Diagnosis not present

## 2020-11-20 DIAGNOSIS — Z6841 Body Mass Index (BMI) 40.0 and over, adult: Secondary | ICD-10-CM | POA: Diagnosis not present

## 2020-11-20 DIAGNOSIS — M5416 Radiculopathy, lumbar region: Secondary | ICD-10-CM | POA: Diagnosis not present

## 2020-11-28 ENCOUNTER — Other Ambulatory Visit: Payer: Self-pay

## 2020-11-28 ENCOUNTER — Other Ambulatory Visit: Payer: Self-pay | Admitting: Family Medicine

## 2020-11-28 ENCOUNTER — Ambulatory Visit
Admission: RE | Admit: 2020-11-28 | Discharge: 2020-11-28 | Disposition: A | Discharge status: Home / Self Care | Payer: Medicare HMO | Source: Ambulatory Visit | Attending: Family Medicine | Admitting: Family Medicine

## 2020-11-28 DIAGNOSIS — Z1231 Encounter for screening mammogram for malignant neoplasm of breast: Secondary | ICD-10-CM

## 2020-12-03 ENCOUNTER — Other Ambulatory Visit (HOSPITAL_COMMUNITY): Payer: Self-pay

## 2020-12-03 DIAGNOSIS — E1169 Type 2 diabetes mellitus with other specified complication: Secondary | ICD-10-CM | POA: Diagnosis not present

## 2020-12-03 DIAGNOSIS — E785 Hyperlipidemia, unspecified: Secondary | ICD-10-CM | POA: Diagnosis not present

## 2020-12-03 DIAGNOSIS — H9193 Unspecified hearing loss, bilateral: Secondary | ICD-10-CM | POA: Diagnosis not present

## 2020-12-03 DIAGNOSIS — Z7984 Long term (current) use of oral hypoglycemic drugs: Secondary | ICD-10-CM | POA: Diagnosis not present

## 2020-12-03 DIAGNOSIS — F322 Major depressive disorder, single episode, severe without psychotic features: Secondary | ICD-10-CM | POA: Diagnosis not present

## 2020-12-04 DIAGNOSIS — Z86010 Personal history of colonic polyps: Secondary | ICD-10-CM | POA: Diagnosis not present

## 2020-12-04 DIAGNOSIS — K5730 Diverticulosis of large intestine without perforation or abscess without bleeding: Secondary | ICD-10-CM | POA: Diagnosis not present

## 2020-12-04 DIAGNOSIS — K52832 Lymphocytic colitis: Secondary | ICD-10-CM | POA: Diagnosis not present

## 2020-12-04 DIAGNOSIS — Z800 Family history of malignant neoplasm of digestive organs: Secondary | ICD-10-CM | POA: Diagnosis not present

## 2020-12-11 DIAGNOSIS — M5416 Radiculopathy, lumbar region: Secondary | ICD-10-CM | POA: Diagnosis not present

## 2021-01-07 ENCOUNTER — Other Ambulatory Visit (HOSPITAL_COMMUNITY): Payer: Self-pay

## 2021-01-07 DIAGNOSIS — M5416 Radiculopathy, lumbar region: Secondary | ICD-10-CM | POA: Diagnosis not present

## 2021-01-07 MED ORDER — PREDNISONE 20 MG PO TABS
ORAL_TABLET | ORAL | 0 refills | Status: DC
  Filled 2021-01-07: qty 2, 1d supply, fill #0

## 2021-01-07 MED ORDER — DIPHENHYDRAMINE HCL 25 MG PO CAPS: ORAL_CAPSULE | ORAL | 0 refills | Status: DC

## 2021-01-31 ENCOUNTER — Other Ambulatory Visit (HOSPITAL_COMMUNITY): Payer: Self-pay

## 2021-01-31 DIAGNOSIS — J111 Influenza due to unidentified influenza virus with other respiratory manifestations: Secondary | ICD-10-CM | POA: Diagnosis not present

## 2021-01-31 MED ORDER — OSELTAMIVIR PHOSPHATE 75 MG PO CAPS
ORAL_CAPSULE | ORAL | 0 refills | Status: DC
  Filled 2021-01-31: qty 10, 5d supply, fill #0

## 2021-02-01 ENCOUNTER — Other Ambulatory Visit (HOSPITAL_COMMUNITY): Payer: Self-pay

## 2021-02-01 MED ORDER — HYDROCODONE BIT-HOMATROP MBR 5-1.5 MG/5ML PO SOLN
ORAL | 0 refills | Status: DC
  Filled 2021-02-01: qty 100, 5d supply, fill #0

## 2021-02-11 ENCOUNTER — Other Ambulatory Visit (HOSPITAL_COMMUNITY): Payer: Self-pay

## 2021-02-11 MED ORDER — PREDNISONE 20 MG PO TABS
ORAL_TABLET | ORAL | 0 refills | Status: DC
  Filled 2021-02-11: qty 10, 5d supply, fill #0

## 2021-02-21 ENCOUNTER — Other Ambulatory Visit (HOSPITAL_COMMUNITY): Payer: Self-pay

## 2021-02-21 DIAGNOSIS — R051 Acute cough: Secondary | ICD-10-CM | POA: Diagnosis not present

## 2021-02-21 MED ORDER — AZITHROMYCIN 250 MG PO TABS
ORAL_TABLET | ORAL | 0 refills | Status: DC
  Filled 2021-02-21: qty 6, 5d supply, fill #0

## 2021-02-28 DIAGNOSIS — M5416 Radiculopathy, lumbar region: Secondary | ICD-10-CM | POA: Diagnosis not present

## 2021-04-19 ENCOUNTER — Other Ambulatory Visit (HOSPITAL_COMMUNITY): Payer: Self-pay

## 2021-04-19 MED ORDER — PREDNISONE 20 MG PO TABS
ORAL_TABLET | ORAL | 0 refills | Status: DC
  Filled 2021-04-19: qty 10, 5d supply, fill #0

## 2021-04-22 ENCOUNTER — Other Ambulatory Visit (HOSPITAL_COMMUNITY): Payer: Self-pay

## 2021-05-24 ENCOUNTER — Other Ambulatory Visit (HOSPITAL_COMMUNITY): Payer: Self-pay

## 2021-05-24 DIAGNOSIS — M5416 Radiculopathy, lumbar region: Secondary | ICD-10-CM | POA: Diagnosis not present

## 2021-05-24 MED ORDER — GABAPENTIN 300 MG PO CAPS
ORAL_CAPSULE | ORAL | 2 refills | Status: DC
  Filled 2021-05-24: qty 30, 30d supply, fill #0
  Filled 2021-06-27: qty 30, 30d supply, fill #1

## 2021-06-03 DIAGNOSIS — E559 Vitamin D deficiency, unspecified: Secondary | ICD-10-CM | POA: Diagnosis not present

## 2021-06-03 DIAGNOSIS — M5136 Other intervertebral disc degeneration, lumbar region: Secondary | ICD-10-CM | POA: Diagnosis not present

## 2021-06-03 DIAGNOSIS — Z79899 Other long term (current) drug therapy: Secondary | ICD-10-CM | POA: Diagnosis not present

## 2021-06-03 DIAGNOSIS — F322 Major depressive disorder, single episode, severe without psychotic features: Secondary | ICD-10-CM | POA: Diagnosis not present

## 2021-06-03 DIAGNOSIS — E785 Hyperlipidemia, unspecified: Secondary | ICD-10-CM | POA: Diagnosis not present

## 2021-06-03 DIAGNOSIS — E1169 Type 2 diabetes mellitus with other specified complication: Secondary | ICD-10-CM | POA: Diagnosis not present

## 2021-06-03 DIAGNOSIS — E2839 Other primary ovarian failure: Secondary | ICD-10-CM | POA: Diagnosis not present

## 2021-06-03 DIAGNOSIS — Z0000 Encounter for general adult medical examination without abnormal findings: Secondary | ICD-10-CM | POA: Diagnosis not present

## 2021-06-03 DIAGNOSIS — M797 Fibromyalgia: Secondary | ICD-10-CM | POA: Diagnosis not present

## 2021-06-10 ENCOUNTER — Other Ambulatory Visit: Payer: Self-pay | Admitting: Family Medicine

## 2021-06-10 DIAGNOSIS — E2839 Other primary ovarian failure: Secondary | ICD-10-CM

## 2021-06-11 DIAGNOSIS — M5416 Radiculopathy, lumbar region: Secondary | ICD-10-CM | POA: Diagnosis not present

## 2021-06-27 DIAGNOSIS — M5431 Sciatica, right side: Secondary | ICD-10-CM | POA: Diagnosis not present

## 2021-06-27 DIAGNOSIS — E669 Obesity, unspecified: Secondary | ICD-10-CM | POA: Diagnosis not present

## 2021-06-27 DIAGNOSIS — M6281 Muscle weakness (generalized): Secondary | ICD-10-CM | POA: Diagnosis not present

## 2021-06-27 DIAGNOSIS — R262 Difficulty in walking, not elsewhere classified: Secondary | ICD-10-CM | POA: Diagnosis not present

## 2021-06-27 DIAGNOSIS — M799 Soft tissue disorder, unspecified: Secondary | ICD-10-CM | POA: Diagnosis not present

## 2021-06-28 ENCOUNTER — Other Ambulatory Visit (HOSPITAL_COMMUNITY): Payer: Self-pay

## 2021-07-01 DIAGNOSIS — R262 Difficulty in walking, not elsewhere classified: Secondary | ICD-10-CM | POA: Diagnosis not present

## 2021-07-01 DIAGNOSIS — M799 Soft tissue disorder, unspecified: Secondary | ICD-10-CM | POA: Diagnosis not present

## 2021-07-01 DIAGNOSIS — E669 Obesity, unspecified: Secondary | ICD-10-CM | POA: Diagnosis not present

## 2021-07-01 DIAGNOSIS — M5431 Sciatica, right side: Secondary | ICD-10-CM | POA: Diagnosis not present

## 2021-07-01 DIAGNOSIS — M6281 Muscle weakness (generalized): Secondary | ICD-10-CM | POA: Diagnosis not present

## 2021-07-04 ENCOUNTER — Ambulatory Visit: Payer: Medicare HMO | Admitting: Adult Health

## 2021-07-05 DIAGNOSIS — M6281 Muscle weakness (generalized): Secondary | ICD-10-CM | POA: Diagnosis not present

## 2021-07-05 DIAGNOSIS — M799 Soft tissue disorder, unspecified: Secondary | ICD-10-CM | POA: Diagnosis not present

## 2021-07-05 DIAGNOSIS — E669 Obesity, unspecified: Secondary | ICD-10-CM | POA: Diagnosis not present

## 2021-07-05 DIAGNOSIS — R262 Difficulty in walking, not elsewhere classified: Secondary | ICD-10-CM | POA: Diagnosis not present

## 2021-07-05 DIAGNOSIS — M5431 Sciatica, right side: Secondary | ICD-10-CM | POA: Diagnosis not present

## 2021-07-08 ENCOUNTER — Ambulatory Visit: Payer: Medicare HMO | Admitting: Adult Health

## 2021-07-09 DIAGNOSIS — R262 Difficulty in walking, not elsewhere classified: Secondary | ICD-10-CM | POA: Diagnosis not present

## 2021-07-09 DIAGNOSIS — M5431 Sciatica, right side: Secondary | ICD-10-CM | POA: Diagnosis not present

## 2021-07-09 DIAGNOSIS — E669 Obesity, unspecified: Secondary | ICD-10-CM | POA: Diagnosis not present

## 2021-07-09 DIAGNOSIS — M6281 Muscle weakness (generalized): Secondary | ICD-10-CM | POA: Diagnosis not present

## 2021-07-09 DIAGNOSIS — M799 Soft tissue disorder, unspecified: Secondary | ICD-10-CM | POA: Diagnosis not present

## 2021-07-11 ENCOUNTER — Encounter: Payer: Self-pay | Admitting: Adult Health

## 2021-07-11 ENCOUNTER — Ambulatory Visit (INDEPENDENT_AMBULATORY_CARE_PROVIDER_SITE_OTHER): Payer: Medicare HMO

## 2021-07-11 ENCOUNTER — Ambulatory Visit: Payer: Medicare HMO | Admitting: Adult Health

## 2021-07-11 ENCOUNTER — Other Ambulatory Visit: Payer: Self-pay | Admitting: Family Medicine

## 2021-07-11 VITALS — BP 116/72 | HR 75 | Temp 98.2°F | Ht 63.0 in | Wt 243.4 lb

## 2021-07-11 DIAGNOSIS — J9611 Chronic respiratory failure with hypoxia: Secondary | ICD-10-CM

## 2021-07-11 DIAGNOSIS — R053 Chronic cough: Secondary | ICD-10-CM | POA: Diagnosis not present

## 2021-07-11 DIAGNOSIS — R262 Difficulty in walking, not elsewhere classified: Secondary | ICD-10-CM | POA: Diagnosis not present

## 2021-07-11 DIAGNOSIS — Z1231 Encounter for screening mammogram for malignant neoplasm of breast: Secondary | ICD-10-CM

## 2021-07-11 DIAGNOSIS — R059 Cough, unspecified: Secondary | ICD-10-CM | POA: Diagnosis not present

## 2021-07-11 DIAGNOSIS — G4733 Obstructive sleep apnea (adult) (pediatric): Secondary | ICD-10-CM | POA: Diagnosis not present

## 2021-07-11 DIAGNOSIS — E669 Obesity, unspecified: Secondary | ICD-10-CM | POA: Diagnosis not present

## 2021-07-11 DIAGNOSIS — M6281 Muscle weakness (generalized): Secondary | ICD-10-CM | POA: Diagnosis not present

## 2021-07-11 DIAGNOSIS — M5431 Sciatica, right side: Secondary | ICD-10-CM | POA: Diagnosis not present

## 2021-07-11 DIAGNOSIS — M799 Soft tissue disorder, unspecified: Secondary | ICD-10-CM | POA: Diagnosis not present

## 2021-07-11 NOTE — Addendum Note (Signed)
Addended by: Vanessa Barbara on: 07/11/2021 03:59 PM   Modules accepted: Orders

## 2021-07-11 NOTE — Assessment & Plan Note (Signed)
Continue on oxygen with CPAP at bedtime

## 2021-07-11 NOTE — Assessment & Plan Note (Signed)
Excellent control on nocturnal CPAP.  Plan  Patient Instructions  Begin Pepcid '20mg'$  At bedtime  for 2 weeks , then As needed   Begin Zyrtec '10mg'$  At bedtime  for 2 weeks and then as needed  Chest xray today  Delsym 2 tsp Twice daily  As needed  cough  Sips of water to soothe throat and avoid throat clearing If cough does not resolve call back for sooner follow up   Continue on CPAP At bedtime with Oxygen 2l/m .  Work on healthy weight  Do not drive if sleepy  Follow with Dr. Halford Chessman  Or Selinda Korzeniewski NP in 1 year and As needed    m

## 2021-07-11 NOTE — Patient Instructions (Signed)
Begin Pepcid '20mg'$  At bedtime  for 2 weeks , then As needed   Begin Zyrtec '10mg'$  At bedtime  for 2 weeks and then as needed  Chest xray today  Delsym 2 tsp Twice daily  As needed  cough  Sips of water to soothe throat and avoid throat clearing If cough does not resolve call back for sooner follow up   Continue on CPAP At bedtime with Oxygen 2l/m .  Work on healthy weight  Do not drive if sleepy  Follow with Dr. Halford Chessman  Or Javaughn Opdahl NP in 1 year and As needed

## 2021-07-11 NOTE — Progress Notes (Signed)
$'@Patient'b$  ID: Mary [name], female    DOB: 11-16-1955, 66 y.o.   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  Basal SpO2 94%, low SpO2 83%.  Spent 8 min with SpO2 < 88%. CPAP titration 05/15/16 >> CPAP 10 cm H2O >> AHI 1.2.  Did not need supplemental oxygen. HST 06/12/16 >> 17.2, SaO2 low 82% CPAP 05/17/17 to 06/15/17 >> used on 30 of 30 nights with average 8 hrs 11 min.  Average AHI 3.6 with CPAP 10 cm H2O  ONO with CPAP and 2 liters 10/23/16 >> test time 3 hrs 25 min.  Average SpO2 92%, low SpO2 89%.     Cardiac tests Echo 11/06/15 >> EF 55 to 60%, grade 2 DD  07/11/2021 Follow up : OSA /OHS  Patient returns for a 1 year follow-up.  Patient has underlying sleep apnea on nocturnal CPAP with oxygen.  Patient says she is doing very well on CPAP.  She wears it every single night never misses a night.  Feels that she benefits from CPAP.  CPAP download shows 100% compliance.  Daily average usage at 7.5 hours.  Patient is on CPAP 10 cm H2O.  AHI 2.5/hour.  Minimal leaks.  Had flu in 01/2021 recovered okay except for intermittent wheezing and cough . Has severe coughing fits on occasion . Seen by PCP  treated with steroids and antibiotics got better . No fever , discolored , hemoptysis . Never smoker . Fully retired. Has 1 indoor cat.  Patient says she is active.  Denies any significant shortness of breath.  Patient says she is feeling better but frustrated that she still has some intermittent cough or wheezing since January Does have frequent throat clearing and postnasal drainage   Allergies  Allergen Reactions    Aspirin Anaphylaxis and Swelling   Chicory Root [Cichorium Intybus] Anaphylaxis, Shortness Of Breath, Itching and Swelling    Swelling of tongue and throat. Scratch throat. cough   Nsaids Anaphylaxis and Swelling   Shellfish Allergy Anaphylaxis and Swelling   Celebrex [Celecoxib] Swelling    Facial swelling    Immunization History  Administered Date(s) Administered   Fluad Quad(high Dose 65+) 10/19/2020   Influenza Split 10/25/2015, 10/18/2016, 10/19/2017   Influenza,inj,quad, With Preservative 10/22/2016   PFIZER(Purple Top)SARS-COV-2 Vaccination 02/28/2019, 03/21/2019   Zoster Recombinat (Shingrix) 02/26/2018, 09/02/2018    Past Medical History:  Diagnosis Date   Anaphylaxis    Chicory root, shellfish   Arthritis    hands, knees, neck - no meds   Celiac disease    Depression    Fibromyalgia    GERD (gastroesophageal reflux disease)    no meds   Morbid obesity ([place]) 10/19/2015   PONV (postoperative nausea and vomiting)    SVD (spontaneous vaginal delivery)    x 2   Systolic murmur 04/12/4008    Tobacco History: Social History   Tobacco Use  Smoking Status Never  Smokeless Tobacco Never   Counseling given: Not Answered   Outpatient Medications Prior to Visit  Medication Sig Dispense Refill   acetaminophen (TYLENOL) 500 MG  tablet Take 1,000 mg by mouth daily as needed for headache.     Ascorbic Acid (VITAMIN C PO) Take 1 tablet by mouth daily.     atorvastatin (LIPITOR) 20 MG tablet Take 20 mg by mouth daily.     Cholecalciferol (VITAMIN D3 PO) Take 1 capsule by mouth daily.     Cyanocobalamin (B-12 PO) Take 1 capsule by mouth daily.     diclofenac sodium (VOLTAREN) 1 % GEL Apply 2 g topically daily as needed (for pain).     diphenhydrAMINE (BENADRYL) 25 MG tablet Take 25 mg by mouth 2 (two) times daily as needed for sleep, allergies or itching.     DULoxetine (CYMBALTA) 60 MG capsule Take 60 mg by mouth at bedtime.      EPINEPHrine 0.3 mg/0.3 mL IJ SOAJ injection  Inject 0.3 mg into the muscle once as needed (allergic reaction).      glucose blood (FREESTYLE LITE) test strip Use to check blood sugar daily 100 strip 3   glucose blood (FREESTYLE LITE) test strip Use to test your blood sugar daily 100 strip 3   Lancets (FREESTYLE) lancets Use to check blood sugar daily 100 each 3   metFORMIN (GLUCOPHAGE-XR) 500 MG 24 hr tablet Take 2,000 mg by mouth daily with breakfast.     metroNIDAZOLE (METROCREAM) 0.75 % cream Apply a small amount to skin twice a day. 45 g 3   Omega-3 Fatty Acids (FISH OIL PO) Take 2 capsules by mouth daily.     amoxicillin (AMOXIL) 500 MG capsule TAKE 4 CAPSULES BY MOUTH 1 HOUR PRIOR TO DENTAL APPOINTMENT. (Patient not taking: Reported on 07/11/2021) 8 capsule 99   azithromycin (ZITHROMAX) 250 MG tablet Take 2 tablets by mouth on day 1, then take 1 tablet daily for 4 days (Patient not taking: Reported on 07/11/2021) 6 tablet 0   budesonide (ENTOCORT EC) 3 MG 24 hr capsule Take 3 capsules by mouth in the morning (Patient not taking: Reported on 07/11/2021) 90 capsule 12   cyclobenzaprine (FLEXERIL) 10 MG tablet Take 1 tablet by mouth 3 times daily as needed for spasm (Patient not taking: Reported on 07/11/2021) 30 tablet 0   diphenhydrAMINE (BENADRYL) 25 mg capsule TAKE 1 CAPSULE BY MOUTH NIGHT BEFORE INJECTION, TAKE 1 CAPSULE  MORNING OF INJECTION, TAKE 1 CAPSULE AT ARRIVAL OF APPOINTMENT (Patient not taking: Reported on 07/11/2021) 3 capsule 0   gabapentin (NEURONTIN) 100 MG capsule Take 1-3 capsules (100-300 mg total) by mouth at bedtime. (Patient not taking: Reported on 07/11/2021) 90 capsule 2   gabapentin (NEURONTIN) 300 MG capsule Take 1 capsule by mouth at bedtime. (Patient not taking: Reported on 07/11/2021) 30 capsule 2   HYDROcodone bit-homatropine (HYCODAN) 5-1.5 MG/5ML syrup Take 5 mls by mouth every 6 hours as needed (Patient not taking: Reported on 07/11/2021) 100 mL 0   HYDROcodone-acetaminophen (NORCO/VICODIN) 5-325 MG tablet Take 1  tablet by mouth every 8 hours for 3 days. (Patient not taking: Reported on 07/11/2021) 9 tablet 0   methocarbamol (ROBAXIN) 750 MG tablet Take 1 tablet by mouth every 8 hours (Patient not taking: Reported on 07/11/2021) 15 tablet 0   oseltamivir (TAMIFLU) 75 MG capsule Take 1 capsule by mouth 2 times a day (Patient not taking: Reported on 07/11/2021) 10 capsule 0   oxyCODONE-acetaminophen (PERCOCET/ROXICET) 5-325 MG tablet Take 1 tablet by mouth every 6 hours as needed (Patient not taking: Reported on 07/11/2021) 20 tablet 0   predniSONE (DELTASONE) 20 MG tablet Take 2 tablets by  mouth once a day for 5 days (Patient not taking: Reported on 07/11/2021) 10 tablet 0   No facility-administered medications prior to visit.     Review of Systems:   Constitutional:   No  weight loss, night sweats,  Fevers, chills,  +fatigue, or  lassitude.  HEENT:   No headaches,  Difficulty swallowing,  Tooth/dental problems, or  Sore throat,                No sneezing, itching, ear ache,  +nasal congestion, post nasal drip,   CV:  No chest pain,  Orthopnea, PND, swelling in lower extremities, anasarca, dizziness, palpitations, syncope.   GI  No heartburn, indigestion, abdominal pain, nausea, vomiting, diarrhea, change in bowel habits, loss of appetite, bloody stools.   Resp:   No chest wall deformity  Skin: no rash or lesions.  GU: no dysuria, change in color of urine, no urgency or frequency.  No flank pain, no hematuria   MS:  No joint pain or swelling.  No decreased range of motion.  No back pain.    Physical Exam  BP 116/72 (BP Location: Left Arm, Patient Position: Sitting, Cuff Size: Large)   Pulse 75   Temp 98.2 F (36.8 C) (Oral)   Ht '5\' 3"'$  (1.6 m)   Wt 243 lb 6.4 oz (110.4 kg)   SpO2 96%   BMI 43.12 kg/m   GEN: A/Ox3; pleasant , NAD, well nourished    HEENT:  [place]/AT,  NOSE-clear, THROAT-clear, no lesions, no postnasal drip or exudate noted.   NECK:  Supple w/ fair ROM; no JVD; normal  carotid impulses w/o bruits; no thyromegaly or nodules palpated; no lymphadenopathy.    RESP  Clear  P & A; w/o, wheezes/ rales/ or rhonchi. no accessory muscle use, no dullness to percussion  CARD:  RRR, no m/r/g, no peripheral edema, pulses intact, no cyanosis or clubbing.  GI:   Soft & nt; nml bowel sounds; no organomegaly or masses detected.   Musco: Warm bil, no deformities or joint swelling noted.   Neuro: alert, no focal deficits noted.    Skin: Warm, no lesions or rashes    Lab Results:   BMET   BNP No results found for: "BNP"  ProBNP No results found for: "PROBNP"  Imaging: No results found.        No data to display          No results found for: "NITRICOXIDE"      Assessment & Plan:   OSA (obstructive sleep apnea) Excellent control on nocturnal CPAP.  Plan  Patient Instructions  Begin Pepcid '20mg'$  At bedtime  for 2 weeks , then As needed   Begin Zyrtec '10mg'$  At bedtime  for 2 weeks and then as needed  Chest xray today  Delsym 2 tsp Twice daily  As needed  cough  Sips of water to soothe throat and avoid throat clearing If cough does not resolve call back for sooner follow up   Continue on CPAP At bedtime with Oxygen 2l/m .  Work on healthy weight  Do not drive if sleepy  Follow with Dr. Halford Chessman  Or Rishit Burkhalter NP in 1 year and As needed    m   Chronic respiratory failure with hypoxia ([place]) Continue on oxygen with CPAP at bedtime  Chronic cough Most likely has a postviral cough.  Symptoms seem to be improving.  We will check chest x-ray today.  Treat for triggers including postnasal drainage and reflux.  Cough control regimen.  if symptoms persisted would need work-up with pulmonary function testing and possible allergy testing  Plan  Patient Instructions  Begin Pepcid '20mg'$  At bedtime  for 2 weeks , then As needed   Begin Zyrtec '10mg'$  At bedtime  for 2 weeks and then as needed  Chest xray today  Delsym 2 tsp Twice daily  As needed  cough   Sips of water to soothe throat and avoid throat clearing If cough does not resolve call back for sooner follow up   Continue on CPAP At bedtime with Oxygen 2l/m .  Work on healthy weight  Do not drive if sleepy  Follow with Dr. Halford Chessman  Or Aashritha Miedema NP in 1 year and As needed         Rexene Edison, NP 07/11/2021

## 2021-07-11 NOTE — Assessment & Plan Note (Signed)
Most likely has a postviral cough.  Symptoms seem to be improving.  We will check chest x-ray today.  Treat for triggers including postnasal drainage and reflux.  Cough control regimen.  if symptoms persisted would need work-up with pulmonary function testing and possible allergy testing  Plan  Patient Instructions  Begin Pepcid '20mg'$  At bedtime  for 2 weeks , then As needed   Begin Zyrtec '10mg'$  At bedtime  for 2 weeks and then as needed  Chest xray today  Delsym 2 tsp Twice daily  As needed  cough  Sips of water to soothe throat and avoid throat clearing If cough does not resolve call back for sooner follow up   Continue on CPAP At bedtime with Oxygen 2l/m .  Work on healthy weight  Do not drive if sleepy  Follow with Dr. Halford [name]  Or Mary Narine NP in 1 year and As needed

## 2021-07-12 NOTE — Progress Notes (Signed)
Reviewed and agree with assessment/plan.   Coralyn Helling, MD [facility] 07/12/2021, 7:24 AM Pager:  [phone]

## 2021-07-15 DIAGNOSIS — M799 Soft tissue disorder, unspecified: Secondary | ICD-10-CM | POA: Diagnosis not present

## 2021-07-15 DIAGNOSIS — M5431 Sciatica, right side: Secondary | ICD-10-CM | POA: Diagnosis not present

## 2021-07-15 DIAGNOSIS — M6281 Muscle weakness (generalized): Secondary | ICD-10-CM | POA: Diagnosis not present

## 2021-07-15 DIAGNOSIS — R262 Difficulty in walking, not elsewhere classified: Secondary | ICD-10-CM | POA: Diagnosis not present

## 2021-07-15 DIAGNOSIS — E669 Obesity, unspecified: Secondary | ICD-10-CM | POA: Diagnosis not present

## 2021-07-18 DIAGNOSIS — M6281 Muscle weakness (generalized): Secondary | ICD-10-CM | POA: Diagnosis not present

## 2021-07-18 DIAGNOSIS — E669 Obesity, unspecified: Secondary | ICD-10-CM | POA: Diagnosis not present

## 2021-07-18 DIAGNOSIS — M799 Soft tissue disorder, unspecified: Secondary | ICD-10-CM | POA: Diagnosis not present

## 2021-07-18 DIAGNOSIS — R262 Difficulty in walking, not elsewhere classified: Secondary | ICD-10-CM | POA: Diagnosis not present

## 2021-07-18 DIAGNOSIS — M5431 Sciatica, right side: Secondary | ICD-10-CM | POA: Diagnosis not present

## 2021-07-24 DIAGNOSIS — M5431 Sciatica, right side: Secondary | ICD-10-CM | POA: Diagnosis not present

## 2021-07-24 DIAGNOSIS — E669 Obesity, unspecified: Secondary | ICD-10-CM | POA: Diagnosis not present

## 2021-07-24 DIAGNOSIS — M799 Soft tissue disorder, unspecified: Secondary | ICD-10-CM | POA: Diagnosis not present

## 2021-07-24 DIAGNOSIS — R262 Difficulty in walking, not elsewhere classified: Secondary | ICD-10-CM | POA: Diagnosis not present

## 2021-07-24 DIAGNOSIS — M6281 Muscle weakness (generalized): Secondary | ICD-10-CM | POA: Diagnosis not present

## 2021-07-26 DIAGNOSIS — E669 Obesity, unspecified: Secondary | ICD-10-CM | POA: Diagnosis not present

## 2021-07-26 DIAGNOSIS — M799 Soft tissue disorder, unspecified: Secondary | ICD-10-CM | POA: Diagnosis not present

## 2021-07-26 DIAGNOSIS — R262 Difficulty in walking, not elsewhere classified: Secondary | ICD-10-CM | POA: Diagnosis not present

## 2021-07-26 DIAGNOSIS — M5431 Sciatica, right side: Secondary | ICD-10-CM | POA: Diagnosis not present

## 2021-07-26 DIAGNOSIS — M6281 Muscle weakness (generalized): Secondary | ICD-10-CM | POA: Diagnosis not present

## 2021-07-30 DIAGNOSIS — E669 Obesity, unspecified: Secondary | ICD-10-CM | POA: Diagnosis not present

## 2021-07-30 DIAGNOSIS — R262 Difficulty in walking, not elsewhere classified: Secondary | ICD-10-CM | POA: Diagnosis not present

## 2021-07-30 DIAGNOSIS — M5431 Sciatica, right side: Secondary | ICD-10-CM | POA: Diagnosis not present

## 2021-07-30 DIAGNOSIS — M6281 Muscle weakness (generalized): Secondary | ICD-10-CM | POA: Diagnosis not present

## 2021-07-30 DIAGNOSIS — M799 Soft tissue disorder, unspecified: Secondary | ICD-10-CM | POA: Diagnosis not present

## 2021-07-31 ENCOUNTER — Other Ambulatory Visit (HOSPITAL_COMMUNITY): Payer: Self-pay

## 2021-07-31 MED ORDER — DULOXETINE HCL 30 MG PO CPEP
90.0000 mg | ORAL_CAPSULE | Freq: Every day | ORAL | 1 refills | Status: AC
  Filled 2021-07-31: qty 270, 90d supply, fill #0

## 2021-08-01 DIAGNOSIS — M5431 Sciatica, right side: Secondary | ICD-10-CM | POA: Diagnosis not present

## 2021-08-01 DIAGNOSIS — M6281 Muscle weakness (generalized): Secondary | ICD-10-CM | POA: Diagnosis not present

## 2021-08-01 DIAGNOSIS — R262 Difficulty in walking, not elsewhere classified: Secondary | ICD-10-CM | POA: Diagnosis not present

## 2021-08-01 DIAGNOSIS — M799 Soft tissue disorder, unspecified: Secondary | ICD-10-CM | POA: Diagnosis not present

## 2021-08-01 DIAGNOSIS — E669 Obesity, unspecified: Secondary | ICD-10-CM | POA: Diagnosis not present

## 2021-08-06 DIAGNOSIS — M799 Soft tissue disorder, unspecified: Secondary | ICD-10-CM | POA: Diagnosis not present

## 2021-08-06 DIAGNOSIS — M5431 Sciatica, right side: Secondary | ICD-10-CM | POA: Diagnosis not present

## 2021-08-06 DIAGNOSIS — E669 Obesity, unspecified: Secondary | ICD-10-CM | POA: Diagnosis not present

## 2021-08-06 DIAGNOSIS — R262 Difficulty in walking, not elsewhere classified: Secondary | ICD-10-CM | POA: Diagnosis not present

## 2021-08-06 DIAGNOSIS — M6281 Muscle weakness (generalized): Secondary | ICD-10-CM | POA: Diagnosis not present

## 2021-08-08 DIAGNOSIS — M5431 Sciatica, right side: Secondary | ICD-10-CM | POA: Diagnosis not present

## 2021-08-08 DIAGNOSIS — R262 Difficulty in walking, not elsewhere classified: Secondary | ICD-10-CM | POA: Diagnosis not present

## 2021-08-08 DIAGNOSIS — M799 Soft tissue disorder, unspecified: Secondary | ICD-10-CM | POA: Diagnosis not present

## 2021-08-08 DIAGNOSIS — E669 Obesity, unspecified: Secondary | ICD-10-CM | POA: Diagnosis not present

## 2021-08-08 DIAGNOSIS — M6281 Muscle weakness (generalized): Secondary | ICD-10-CM | POA: Diagnosis not present

## 2021-08-13 DIAGNOSIS — M6281 Muscle weakness (generalized): Secondary | ICD-10-CM | POA: Diagnosis not present

## 2021-08-13 DIAGNOSIS — M799 Soft tissue disorder, unspecified: Secondary | ICD-10-CM | POA: Diagnosis not present

## 2021-08-13 DIAGNOSIS — E669 Obesity, unspecified: Secondary | ICD-10-CM | POA: Diagnosis not present

## 2021-08-13 DIAGNOSIS — R262 Difficulty in walking, not elsewhere classified: Secondary | ICD-10-CM | POA: Diagnosis not present

## 2021-08-13 DIAGNOSIS — M5431 Sciatica, right side: Secondary | ICD-10-CM | POA: Diagnosis not present

## 2021-08-14 ENCOUNTER — Other Ambulatory Visit (HOSPITAL_COMMUNITY): Payer: Self-pay

## 2021-08-14 DIAGNOSIS — M2550 Pain in unspecified joint: Secondary | ICD-10-CM | POA: Diagnosis not present

## 2021-08-14 DIAGNOSIS — E538 Deficiency of other specified B group vitamins: Secondary | ICD-10-CM | POA: Diagnosis not present

## 2021-08-14 DIAGNOSIS — E569 Vitamin deficiency, unspecified: Secondary | ICD-10-CM | POA: Diagnosis not present

## 2021-08-14 DIAGNOSIS — M549 Dorsalgia, unspecified: Secondary | ICD-10-CM | POA: Diagnosis not present

## 2021-08-14 DIAGNOSIS — R5383 Other fatigue: Secondary | ICD-10-CM | POA: Diagnosis not present

## 2021-08-14 MED ORDER — OZEMPIC (0.25 OR 0.5 MG/DOSE) 2 MG/3ML ~~LOC~~ SOPN
PEN_INJECTOR | SUBCUTANEOUS | 5 refills | Status: DC
  Filled 2021-08-14: qty 3, 42d supply, fill #0
  Filled 2021-09-23: qty 3, 42d supply, fill #1

## 2021-08-15 DIAGNOSIS — R262 Difficulty in walking, not elsewhere classified: Secondary | ICD-10-CM | POA: Diagnosis not present

## 2021-08-15 DIAGNOSIS — E669 Obesity, unspecified: Secondary | ICD-10-CM | POA: Diagnosis not present

## 2021-08-15 DIAGNOSIS — M6281 Muscle weakness (generalized): Secondary | ICD-10-CM | POA: Diagnosis not present

## 2021-08-15 DIAGNOSIS — M799 Soft tissue disorder, unspecified: Secondary | ICD-10-CM | POA: Diagnosis not present

## 2021-08-15 DIAGNOSIS — M5431 Sciatica, right side: Secondary | ICD-10-CM | POA: Diagnosis not present

## 2021-08-20 DIAGNOSIS — E669 Obesity, unspecified: Secondary | ICD-10-CM | POA: Diagnosis not present

## 2021-08-20 DIAGNOSIS — R262 Difficulty in walking, not elsewhere classified: Secondary | ICD-10-CM | POA: Diagnosis not present

## 2021-08-20 DIAGNOSIS — M5431 Sciatica, right side: Secondary | ICD-10-CM | POA: Diagnosis not present

## 2021-08-20 DIAGNOSIS — M799 Soft tissue disorder, unspecified: Secondary | ICD-10-CM | POA: Diagnosis not present

## 2021-08-20 DIAGNOSIS — M6281 Muscle weakness (generalized): Secondary | ICD-10-CM | POA: Diagnosis not present

## 2021-08-27 DIAGNOSIS — M6281 Muscle weakness (generalized): Secondary | ICD-10-CM | POA: Diagnosis not present

## 2021-08-27 DIAGNOSIS — M799 Soft tissue disorder, unspecified: Secondary | ICD-10-CM | POA: Diagnosis not present

## 2021-08-27 DIAGNOSIS — M5431 Sciatica, right side: Secondary | ICD-10-CM | POA: Diagnosis not present

## 2021-08-27 DIAGNOSIS — R262 Difficulty in walking, not elsewhere classified: Secondary | ICD-10-CM | POA: Diagnosis not present

## 2021-08-27 DIAGNOSIS — E669 Obesity, unspecified: Secondary | ICD-10-CM | POA: Diagnosis not present

## 2021-08-29 ENCOUNTER — Encounter: Payer: Self-pay | Admitting: Allergy

## 2021-08-29 ENCOUNTER — Other Ambulatory Visit (HOSPITAL_COMMUNITY): Payer: Self-pay

## 2021-08-29 ENCOUNTER — Ambulatory Visit (INDEPENDENT_AMBULATORY_CARE_PROVIDER_SITE_OTHER): Payer: Medicare HMO | Admitting: Allergy

## 2021-08-29 VITALS — BP 120/70 | HR 80 | Temp 97.4°F | Ht 63.0 in | Wt 241.1 lb

## 2021-08-29 DIAGNOSIS — T7800XA Anaphylactic reaction due to unspecified food, initial encounter: Secondary | ICD-10-CM | POA: Diagnosis not present

## 2021-08-29 DIAGNOSIS — T50905D Adverse effect of unspecified drugs, medicaments and biological substances, subsequent encounter: Secondary | ICD-10-CM

## 2021-08-29 DIAGNOSIS — M6281 Muscle weakness (generalized): Secondary | ICD-10-CM | POA: Diagnosis not present

## 2021-08-29 DIAGNOSIS — L2489 Irritant contact dermatitis due to other agents: Secondary | ICD-10-CM

## 2021-08-29 DIAGNOSIS — K9049 Malabsorption due to intolerance, not elsewhere classified: Secondary | ICD-10-CM

## 2021-08-29 DIAGNOSIS — R262 Difficulty in walking, not elsewhere classified: Secondary | ICD-10-CM | POA: Diagnosis not present

## 2021-08-29 DIAGNOSIS — M799 Soft tissue disorder, unspecified: Secondary | ICD-10-CM | POA: Diagnosis not present

## 2021-08-29 DIAGNOSIS — M5431 Sciatica, right side: Secondary | ICD-10-CM | POA: Diagnosis not present

## 2021-08-29 DIAGNOSIS — E669 Obesity, unspecified: Secondary | ICD-10-CM | POA: Diagnosis not present

## 2021-08-29 MED ORDER — EPINEPHRINE 0.3 MG/0.3ML IJ SOAJ
0.3000 mg | INTRAMUSCULAR | 1 refills | Status: DC | PRN
  Filled 2021-08-29: qty 2, 30d supply, fill #0

## 2021-08-29 NOTE — Progress Notes (Addendum)
New Patient Note  RE: Mary [name] MRN: [id number] DOB: [birth year] Date of Office Visit: 08/29/2021  Primary care provider: Harlan Stains, MD  Chief Complaint: GI issues; food allergy  History of present illness: Mary [name] is a 66 y.o. female presenting today for evaluation of food allergy.   She states for quite a while has been having issues with bowel incontinence.  She does see a gastroenterologist Dr. Collene Mares.  She states can have explosive stooling.  She states her bowels are always loose.  She states her PCP thought having testing for food sensitivities might be helpful.  She is in PT to help with pelvic floor tightening.  She has tried the whole 30 food diet that did seem to help some; this diet you take everything out at the same time and add back in diet (all dairy, grains, alcohol, sugars, nuts).  She added wine back in diet as the first group and did not have any issues with this.  She does feel like she had more issues when dairy was added back in.   She is concerned about the following foods: coffee/caffiene, cheeder cheese, cream cheese, cream, eggs, garlic, lima beans, peas, mushrooms, buckwheat.   She states she can drink coke without issue.   She states she knows she has a shellfish allergy (mother was shrimp allergic and Leba tried it around her teen years with lobster/crab) and chicory root allergy (allergy started about 10 years ago).  She stays away from these foods.  With shellfish ingestion she reports mouth swelling, throat swelling and puffy eyes, mostly facial involvement.  With chicory root has similar reactions as shellfish and can have occasional hives on body.  She has needed to use her epipen before for reaction and needed ED care.    She has celiac disease (had a positive blood test about 15 years ago) and stays away from gluten but she states chicory root seems to be added in more to foods.  She states she had sorbet at [employer] and it had chicory  root in it.  She had a reaction to this.  She now takes benadryl with her as well as her epipen.  She does report having history of lymphocytic colitis.  She also is concerned about diclofenac and wool as potential allergies.  With diclofenac she states she was taking it orally for a while and believes it destroyed her stomach.   She has had ibuprofen and shortly after ingestion had lip swelling.  Thus she has avoided NSAIDs including aspirin.  She states when she wears wool it causes her to itch.   Review of systems: Review of Systems  Constitutional: Negative.   HENT: Negative.    Eyes: Negative.   Respiratory: Negative.    Cardiovascular: Negative.   Gastrointestinal:        See HPI  Musculoskeletal: Negative.   Skin: Negative.   Allergic/Immunologic: Negative.   Neurological: Negative.     All other systems negative unless noted above in HPI  Past medical history: Past Medical History:  Diagnosis Date   Anaphylaxis    Chicory root, shellfish   Arthritis    hands, knees, neck - no meds   Celiac disease    Depression    Fibromyalgia    GERD (gastroesophageal reflux disease)    no meds   Morbid obesity ([place]) 10/19/2015   PONV (postoperative nausea and vomiting)    SVD (spontaneous vaginal delivery)    x 2  Systolic murmur 06/21/930    Past surgical history: Past Surgical History:  Procedure Laterality Date   BILATERAL SALPINGECTOMY Bilateral 03/30/2013   Procedure: BILATERAL SALPINGECTOMY;  Surgeon: Thurnell Lose, MD;  Location: [facility];  Service: Gynecology;  Laterality: Bilateral;   CARDIAC CATHETERIZATION N/A 12/07/2015   Procedure: Left Heart Cath and Coronary Angiography;  Surgeon: Leonie Man, MD;  Location: [facility];  Service: Cardiovascular;  Laterality: N/A;   COLONOSCOPY WITH PROPOFOL N/A 02/17/2020   Procedure: COLONOSCOPY WITH PROPOFOL;  Surgeon: Carol Ada, MD;  Location: [facility];  Service: Endoscopy;  Laterality: N/A;   dermoid  cystect     HYSTEROSCOPY WITH D & C  01/28/2012   Procedure: DILATATION AND CURETTAGE /HYSTEROSCOPY;  Surgeon: Thurnell Lose, MD;  Location: [facility];  Service: Gynecology;  Laterality: N/A;   LAPAROSCOPIC ASSISTED VAGINAL HYSTERECTOMY N/A 03/30/2013   Procedure: LAPAROSCOPIC ASSISTED VAGINAL HYSTERECTOMY;  Surgeon: Thurnell Lose, MD;  Location: [facility];  Service: Gynecology;  Laterality: N/A;   POLYPECTOMY  02/17/2020   Procedure: POLYPECTOMY;  Surgeon: Carol Ada, MD;  Location: [facility];  Service: Endoscopy;;   REPLACEMENT TOTAL KNEE     bilateral   SPINAL FUSION     C3-C4   WISDOM TOOTH EXTRACTION      Family history:  Family History  Problem Relation Age of Onset   Bradycardia Mother    Atrial fibrillation Mother    Leukemia Father    Breast cancer Maternal Grandmother     Social history: Lives in a townhome with carpeting in the bedroom with electric and heat pump heating with central and heat pump cooling.  Cat in the home.  There is no concern for water damage, mildew or roaches in the home.  She is retired.   Medication List: Current Outpatient Medications  Medication Sig Dispense Refill   acetaminophen (TYLENOL) 500 MG tablet Take 1,000 mg by mouth daily as needed for headache.     Ascorbic Acid (VITAMIN C PO) Take 1 tablet by mouth daily.     atorvastatin (LIPITOR) 20 MG tablet Take 20 mg by mouth daily.     Cholecalciferol (VITAMIN D3 PO) Take 1 capsule by mouth daily.     Cyanocobalamin (B-12 PO) Take 1 capsule by mouth daily.     diclofenac sodium (VOLTAREN) 1 % GEL Apply 2 g topically daily as needed (for pain).     DULoxetine (CYMBALTA) 30 MG capsule Take 3 capsules (90 mg total) by mouth daily. 270 capsule 1   DULoxetine (CYMBALTA) 60 MG capsule Take 60 mg by mouth at bedtime.      EPINEPHrine 0.3 mg/0.3 mL IJ SOAJ injection Inject 0.3 mg into the muscle once as needed (allergic reaction).      EPINEPHrine 0.3 mg/0.3 mL IJ SOAJ injection Inject 0.3 mg into  the muscle as needed for anaphylaxis. 1 each 1   glucose blood (FREESTYLE LITE) test strip Use to check blood sugar daily 100 strip 3   glucose blood (FREESTYLE LITE) test strip Use to test your blood sugar daily 100 strip 3   Lancets (FREESTYLE) lancets Use to check blood sugar daily 100 each 3   metFORMIN (GLUCOPHAGE-XR) 500 MG 24 hr tablet Take 2,000 mg by mouth daily with breakfast.     metroNIDAZOLE (METROCREAM) 0.75 % cream Apply a small amount to skin twice a day. 45 g 3   Omega-3 Fatty Acids (FISH OIL PO) Take 2 capsules by mouth daily.     Semaglutide,0.25 or  0.'5MG'$ /DOS, (OZEMPIC, 0.25 OR 0.5 MG/DOSE,) 2 MG/3ML SOPN Inject 0.'25mg'$  into the skin once weekly for 4 weeks then increase to 0.'5mg'$  weekly. 6 mL 5   amoxicillin (AMOXIL) 500 MG capsule TAKE 4 CAPSULES BY MOUTH 1 HOUR PRIOR TO DENTAL APPOINTMENT. (Patient not taking: Reported on 07/11/2021) 8 capsule 99   diphenhydrAMINE (BENADRYL) 25 MG tablet Take 25 mg by mouth 2 (two) times daily as needed for sleep, allergies or itching. (Patient not taking: Reported on 08/29/2021)     No current facility-administered medications for this visit.    Known medication allergies: Allergies  Allergen Reactions   Aspirin Anaphylaxis and Swelling   Chicory Root [Cichorium Intybus] Anaphylaxis, Shortness Of Breath, Itching and Swelling    Swelling of tongue and throat. Scratch throat. cough   Nsaids Anaphylaxis and Swelling   Shellfish Allergy Anaphylaxis and Swelling   Celebrex [Celecoxib] Swelling    Facial swelling     Physical examination: Blood pressure 120/70, pulse 80, temperature (!) 97.4 F (36.3 C), height '5\' 3"'$  (1.6 m), weight 241 lb 2 oz (109.4 kg), SpO2 96 %.  General: Alert, interactive, in no acute distress. HEENT: PERRLA, TMs pearly gray, turbinates non-edematous without discharge, post-pharynx non erythematous. Neck: Supple without lymphadenopathy. Lungs: Clear to auscultation without wheezing, rhonchi or rales. {no  increased work of breathing. CV: Normal S1, S2 without murmurs. Abdomen: Nondistended, nontender. Skin: Warm and dry, without lesions or rashes. Extremities:  No clubbing, cyanosis or edema. Neuro:   Grossly intact.  Diagnositics/Labs:  Allergy testing:   Food Adult Perc - 08/29/21 1100     Time Antigen Placed 1108    Allergen Manufacturer Lavella Hammock    Location Back     Control-buffer 50% Glycerol Negative    Control-Histamine 1 mg/ml 2+    1. Peanut Negative    2. Soybean Negative    3. Wheat Negative    5. Milk, cow Negative    6. Egg White, Chicken Negative    11. Pecan Food Negative    12. [place] Negative    13. Almond Negative    25. Shrimp Negative    26. Crab 2+    27. Lobster Negative    28. Oyster Negative    29. Scallops Negative    30. Barley Negative    31. Oat  Negative    32. Rye  Negative    34. Rice Negative    35. Cottonseed Negative    42. Tomato Negative    45. Pea, Green/English Negative    46. Navy Bean Negative    47. Mushrooms Negative    61. Cantaloupe Negative    64. Chocolate/Cacao bean Negative    70. Garlic Negative             Allergy testing results were read and interpreted by provider, documented by clinical staff.   Assessment and plan: Food intolerance Anaphylaxis due to food  We have discussed the following in regards to foods:   Allergy: food allergy is when you have eaten a food, developed an allergic reaction after eating the food and have IgE to the food (positive food testing either by skin testing or blood testing).  Food allergy could lead to life threatening symptoms  Sensitivity: occurs when you have IgE to a food (positive food testing either by skin testing or blood testing) but is a food you eat without any issues.  This is not an allergy and we recommend keeping the food in the diet  Intolerance: this is when you have negative testing by either skin testing or blood testing thus not allergic but the food causes  symptoms (like belly pain, bloating, diarrhea etc) with ingestion.  These foods should be avoided to prevent symptoms.     Food intolerance  - you have previously diagnosed intolerance to gluten with celiac disease - IgE mediated food allergy testing today is negative to concerning foods - will obtain IgE to coffee as we do not have this as a skin testing extract.  Will call you with results and will be available via MyChart - should significant symptoms recur or new symptoms occur, a journal is to be kept recording any foods eaten, beverages consumed, medications taken, activities performed, and environmental conditions within a 6 hour time period prior to the onset of symptoms.    Food allergy - testing to shellfish is positive to crab - we do not have skin or blood testing for chicory root - continue avoidance of shellfish and chicory root  - have access to self-injectable epinephrine (Epipen or AuviQ) 0.'3mg'$  at all times - follow emergency action plan in case of allergic reaction  Adverse medication effects - discussed the NSAID family are known history releasing agents and thus can cause swelling, itching, hives as a side effect.  However people can have IgE mediated allergy to NSAIDs as well.  You can have a specific insect allergy or be allergic to a group of NSAIDs like Cox 1 inhibitors or be allergic to the entire NSAID family.  At some point in the future we can do a graded oral challenge  Contact dermatitis - discussed contact dermatitis potentially to wool.  Patch testing is the test of choice to evaluate for contact dermatitis.  Recommend performing patch testing with the TRUE test patch panels.  Patches are best placed on a Monday with return to office on Wednesday and Friday of same week for readings.  Once patches are in place to do not get them wet.  You can take antihistamines while patches are in place.   Follow-up 1 year or sooner if needed  I appreciate the opportunity to  take part in Jeyli's care. Please do not hesitate to contact me with questions.  Sincerely,   Prudy Feeler, MD Allergy/Immunology [facility]

## 2021-08-29 NOTE — Patient Instructions (Signed)
We have discussed the following in regards to foods:   Allergy: food allergy is when you have eaten a food, developed an allergic reaction after eating the food and have IgE to the food (positive food testing either by skin testing or blood testing).  Food allergy could lead to life threatening symptoms  Sensitivity: occurs when you have IgE to a food (positive food testing either by skin testing or blood testing) but is a food you eat without any issues.  This is not an allergy and we recommend keeping the food in the diet  Intolerance: this is when you have negative testing by either skin testing or blood testing thus not allergic but the food causes symptoms (like belly pain, bloating, diarrhea etc) with ingestion.  These foods should be avoided to prevent symptoms.     Food intolerance  - you have previously diagnosed intolerance to gluten with celiac disease - IgE mediated food allergy testing today is negative to concerning foods - will obtain IgE to coffee as we do not have this as a skin testing extract.  Will call you with results and will be available via MyChart - should significant symptoms recur or new symptoms occur, a journal is to be kept recording any foods eaten, beverages consumed, medications taken, activities performed, and environmental conditions within a 6 hour time period prior to the onset of symptoms.    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

## 2021-09-01 LAB — ALLERGEN, COFFEE, RF221: Coffee: <0.1 kU/L

## 2021-09-02 NOTE — Addendum Note (Signed)
Addended by: Larence Penning on: 09/02/2021 04:48 PM   Modules accepted: Orders

## 2021-09-03 DIAGNOSIS — M6281 Muscle weakness (generalized): Secondary | ICD-10-CM | POA: Diagnosis not present

## 2021-09-03 DIAGNOSIS — M799 Soft tissue disorder, unspecified: Secondary | ICD-10-CM | POA: Diagnosis not present

## 2021-09-03 DIAGNOSIS — E669 Obesity, unspecified: Secondary | ICD-10-CM | POA: Diagnosis not present

## 2021-09-03 DIAGNOSIS — M5431 Sciatica, right side: Secondary | ICD-10-CM | POA: Diagnosis not present

## 2021-09-03 DIAGNOSIS — R262 Difficulty in walking, not elsewhere classified: Secondary | ICD-10-CM | POA: Diagnosis not present

## 2021-09-05 DIAGNOSIS — M6281 Muscle weakness (generalized): Secondary | ICD-10-CM | POA: Diagnosis not present

## 2021-09-05 DIAGNOSIS — R262 Difficulty in walking, not elsewhere classified: Secondary | ICD-10-CM | POA: Diagnosis not present

## 2021-09-05 DIAGNOSIS — E669 Obesity, unspecified: Secondary | ICD-10-CM | POA: Diagnosis not present

## 2021-09-05 DIAGNOSIS — M5431 Sciatica, right side: Secondary | ICD-10-CM | POA: Diagnosis not present

## 2021-09-05 DIAGNOSIS — M799 Soft tissue disorder, unspecified: Secondary | ICD-10-CM | POA: Diagnosis not present

## 2021-09-18 DIAGNOSIS — R262 Difficulty in walking, not elsewhere classified: Secondary | ICD-10-CM | POA: Diagnosis not present

## 2021-09-18 DIAGNOSIS — M6281 Muscle weakness (generalized): Secondary | ICD-10-CM | POA: Diagnosis not present

## 2021-09-18 DIAGNOSIS — M799 Soft tissue disorder, unspecified: Secondary | ICD-10-CM | POA: Diagnosis not present

## 2021-09-18 DIAGNOSIS — E669 Obesity, unspecified: Secondary | ICD-10-CM | POA: Diagnosis not present

## 2021-09-18 DIAGNOSIS — M5431 Sciatica, right side: Secondary | ICD-10-CM | POA: Diagnosis not present

## 2021-09-24 ENCOUNTER — Other Ambulatory Visit (HOSPITAL_COMMUNITY): Payer: Self-pay

## 2021-09-26 DIAGNOSIS — M799 Soft tissue disorder, unspecified: Secondary | ICD-10-CM | POA: Diagnosis not present

## 2021-09-26 DIAGNOSIS — E669 Obesity, unspecified: Secondary | ICD-10-CM | POA: Diagnosis not present

## 2021-09-26 DIAGNOSIS — M5431 Sciatica, right side: Secondary | ICD-10-CM | POA: Diagnosis not present

## 2021-09-26 DIAGNOSIS — R262 Difficulty in walking, not elsewhere classified: Secondary | ICD-10-CM | POA: Diagnosis not present

## 2021-09-26 DIAGNOSIS — M6281 Muscle weakness (generalized): Secondary | ICD-10-CM | POA: Diagnosis not present

## 2021-09-27 ENCOUNTER — Other Ambulatory Visit (HOSPITAL_COMMUNITY): Payer: Self-pay

## 2021-09-27 MED ORDER — OZEMPIC (1 MG/DOSE) 4 MG/3ML ~~LOC~~ SOPN
PEN_INJECTOR | SUBCUTANEOUS | 2 refills | Status: DC
  Filled 2021-09-27: qty 3, 28d supply, fill #0
  Filled 2021-10-28: qty 3, 28d supply, fill #1
  Filled 2021-12-04: qty 3, 28d supply, fill #2

## 2021-10-02 DIAGNOSIS — M799 Soft tissue disorder, unspecified: Secondary | ICD-10-CM | POA: Diagnosis not present

## 2021-10-02 DIAGNOSIS — M6281 Muscle weakness (generalized): Secondary | ICD-10-CM | POA: Diagnosis not present

## 2021-10-02 DIAGNOSIS — E669 Obesity, unspecified: Secondary | ICD-10-CM | POA: Diagnosis not present

## 2021-10-02 DIAGNOSIS — M5431 Sciatica, right side: Secondary | ICD-10-CM | POA: Diagnosis not present

## 2021-10-02 DIAGNOSIS — R262 Difficulty in walking, not elsewhere classified: Secondary | ICD-10-CM | POA: Diagnosis not present

## 2021-10-10 DIAGNOSIS — M6281 Muscle weakness (generalized): Secondary | ICD-10-CM | POA: Diagnosis not present

## 2021-10-10 DIAGNOSIS — R262 Difficulty in walking, not elsewhere classified: Secondary | ICD-10-CM | POA: Diagnosis not present

## 2021-10-10 DIAGNOSIS — M5431 Sciatica, right side: Secondary | ICD-10-CM | POA: Diagnosis not present

## 2021-10-10 DIAGNOSIS — M799 Soft tissue disorder, unspecified: Secondary | ICD-10-CM | POA: Diagnosis not present

## 2021-10-10 DIAGNOSIS — E669 Obesity, unspecified: Secondary | ICD-10-CM | POA: Diagnosis not present

## 2021-10-17 DIAGNOSIS — M6281 Muscle weakness (generalized): Secondary | ICD-10-CM | POA: Diagnosis not present

## 2021-10-17 DIAGNOSIS — E669 Obesity, unspecified: Secondary | ICD-10-CM | POA: Diagnosis not present

## 2021-10-17 DIAGNOSIS — M5431 Sciatica, right side: Secondary | ICD-10-CM | POA: Diagnosis not present

## 2021-10-17 DIAGNOSIS — G4733 Obstructive sleep apnea (adult) (pediatric): Secondary | ICD-10-CM | POA: Diagnosis not present

## 2021-10-17 DIAGNOSIS — R262 Difficulty in walking, not elsewhere classified: Secondary | ICD-10-CM | POA: Diagnosis not present

## 2021-10-17 DIAGNOSIS — G4734 Idiopathic sleep related nonobstructive alveolar hypoventilation: Secondary | ICD-10-CM | POA: Diagnosis not present

## 2021-10-17 DIAGNOSIS — M799 Soft tissue disorder, unspecified: Secondary | ICD-10-CM | POA: Diagnosis not present

## 2021-10-24 DIAGNOSIS — M799 Soft tissue disorder, unspecified: Secondary | ICD-10-CM | POA: Diagnosis not present

## 2021-10-24 DIAGNOSIS — E669 Obesity, unspecified: Secondary | ICD-10-CM | POA: Diagnosis not present

## 2021-10-24 DIAGNOSIS — M6281 Muscle weakness (generalized): Secondary | ICD-10-CM | POA: Diagnosis not present

## 2021-10-24 DIAGNOSIS — M5431 Sciatica, right side: Secondary | ICD-10-CM | POA: Diagnosis not present

## 2021-10-24 DIAGNOSIS — R262 Difficulty in walking, not elsewhere classified: Secondary | ICD-10-CM | POA: Diagnosis not present

## 2021-10-29 ENCOUNTER — Other Ambulatory Visit (HOSPITAL_COMMUNITY): Payer: Self-pay

## 2021-11-07 DIAGNOSIS — E669 Obesity, unspecified: Secondary | ICD-10-CM | POA: Diagnosis not present

## 2021-11-07 DIAGNOSIS — M6281 Muscle weakness (generalized): Secondary | ICD-10-CM | POA: Diagnosis not present

## 2021-11-07 DIAGNOSIS — M799 Soft tissue disorder, unspecified: Secondary | ICD-10-CM | POA: Diagnosis not present

## 2021-11-07 DIAGNOSIS — R262 Difficulty in walking, not elsewhere classified: Secondary | ICD-10-CM | POA: Diagnosis not present

## 2021-11-07 DIAGNOSIS — M5431 Sciatica, right side: Secondary | ICD-10-CM | POA: Diagnosis not present

## 2021-11-14 DIAGNOSIS — E669 Obesity, unspecified: Secondary | ICD-10-CM | POA: Diagnosis not present

## 2021-11-14 DIAGNOSIS — R262 Difficulty in walking, not elsewhere classified: Secondary | ICD-10-CM | POA: Diagnosis not present

## 2021-11-14 DIAGNOSIS — M5431 Sciatica, right side: Secondary | ICD-10-CM | POA: Diagnosis not present

## 2021-11-14 DIAGNOSIS — M6281 Muscle weakness (generalized): Secondary | ICD-10-CM | POA: Diagnosis not present

## 2021-11-14 DIAGNOSIS — M799 Soft tissue disorder, unspecified: Secondary | ICD-10-CM | POA: Diagnosis not present

## 2021-11-21 DIAGNOSIS — M5431 Sciatica, right side: Secondary | ICD-10-CM | POA: Diagnosis not present

## 2021-11-21 DIAGNOSIS — M6281 Muscle weakness (generalized): Secondary | ICD-10-CM | POA: Diagnosis not present

## 2021-11-21 DIAGNOSIS — E669 Obesity, unspecified: Secondary | ICD-10-CM | POA: Diagnosis not present

## 2021-11-21 DIAGNOSIS — M799 Soft tissue disorder, unspecified: Secondary | ICD-10-CM | POA: Diagnosis not present

## 2021-11-21 DIAGNOSIS — R262 Difficulty in walking, not elsewhere classified: Secondary | ICD-10-CM | POA: Diagnosis not present

## 2021-11-28 DIAGNOSIS — M5431 Sciatica, right side: Secondary | ICD-10-CM | POA: Diagnosis not present

## 2021-11-28 DIAGNOSIS — M799 Soft tissue disorder, unspecified: Secondary | ICD-10-CM | POA: Diagnosis not present

## 2021-11-28 DIAGNOSIS — E669 Obesity, unspecified: Secondary | ICD-10-CM | POA: Diagnosis not present

## 2021-11-28 DIAGNOSIS — M6281 Muscle weakness (generalized): Secondary | ICD-10-CM | POA: Diagnosis not present

## 2021-11-28 DIAGNOSIS — R262 Difficulty in walking, not elsewhere classified: Secondary | ICD-10-CM | POA: Diagnosis not present

## 2021-12-03 DIAGNOSIS — M799 Soft tissue disorder, unspecified: Secondary | ICD-10-CM | POA: Diagnosis not present

## 2021-12-03 DIAGNOSIS — R262 Difficulty in walking, not elsewhere classified: Secondary | ICD-10-CM | POA: Diagnosis not present

## 2021-12-03 DIAGNOSIS — E669 Obesity, unspecified: Secondary | ICD-10-CM | POA: Diagnosis not present

## 2021-12-03 DIAGNOSIS — M6281 Muscle weakness (generalized): Secondary | ICD-10-CM | POA: Diagnosis not present

## 2021-12-03 DIAGNOSIS — M5431 Sciatica, right side: Secondary | ICD-10-CM | POA: Diagnosis not present

## 2021-12-04 ENCOUNTER — Other Ambulatory Visit (HOSPITAL_COMMUNITY): Payer: Self-pay

## 2021-12-04 DIAGNOSIS — E1169 Type 2 diabetes mellitus with other specified complication: Secondary | ICD-10-CM | POA: Diagnosis not present

## 2021-12-04 DIAGNOSIS — E785 Hyperlipidemia, unspecified: Secondary | ICD-10-CM | POA: Diagnosis not present

## 2021-12-04 DIAGNOSIS — F322 Major depressive disorder, single episode, severe without psychotic features: Secondary | ICD-10-CM | POA: Diagnosis not present

## 2021-12-04 DIAGNOSIS — E559 Vitamin D deficiency, unspecified: Secondary | ICD-10-CM | POA: Diagnosis not present

## 2021-12-04 DIAGNOSIS — F439 Reaction to severe stress, unspecified: Secondary | ICD-10-CM | POA: Diagnosis not present

## 2021-12-04 DIAGNOSIS — R001 Bradycardia, unspecified: Secondary | ICD-10-CM | POA: Diagnosis not present

## 2021-12-04 MED ORDER — OZEMPIC (1 MG/DOSE) 4 MG/3ML ~~LOC~~ SOPN
1.0000 mg | PEN_INJECTOR | SUBCUTANEOUS | 1 refills | Status: DC
  Filled 2021-12-04: qty 3, 28d supply, fill #0

## 2021-12-10 DIAGNOSIS — M5431 Sciatica, right side: Secondary | ICD-10-CM | POA: Diagnosis not present

## 2021-12-10 DIAGNOSIS — E669 Obesity, unspecified: Secondary | ICD-10-CM | POA: Diagnosis not present

## 2021-12-10 DIAGNOSIS — R262 Difficulty in walking, not elsewhere classified: Secondary | ICD-10-CM | POA: Diagnosis not present

## 2021-12-10 DIAGNOSIS — M6281 Muscle weakness (generalized): Secondary | ICD-10-CM | POA: Diagnosis not present

## 2021-12-10 DIAGNOSIS — M799 Soft tissue disorder, unspecified: Secondary | ICD-10-CM | POA: Diagnosis not present

## 2021-12-17 DIAGNOSIS — E669 Obesity, unspecified: Secondary | ICD-10-CM | POA: Diagnosis not present

## 2021-12-17 DIAGNOSIS — M799 Soft tissue disorder, unspecified: Secondary | ICD-10-CM | POA: Diagnosis not present

## 2021-12-17 DIAGNOSIS — R262 Difficulty in walking, not elsewhere classified: Secondary | ICD-10-CM | POA: Diagnosis not present

## 2021-12-17 DIAGNOSIS — M5431 Sciatica, right side: Secondary | ICD-10-CM | POA: Diagnosis not present

## 2021-12-17 DIAGNOSIS — M6281 Muscle weakness (generalized): Secondary | ICD-10-CM | POA: Diagnosis not present

## 2021-12-18 ENCOUNTER — Other Ambulatory Visit (HOSPITAL_COMMUNITY): Payer: Self-pay

## 2021-12-18 MED ORDER — AMOXICILLIN 500 MG PO CAPS
2000.0000 mg | ORAL_CAPSULE | ORAL | 99 refills | Status: DC
  Filled 2021-12-18: qty 8, 2d supply, fill #0

## 2021-12-20 ENCOUNTER — Ambulatory Visit
Admission: RE | Admit: 2021-12-20 | Discharge: 2021-12-20 | Disposition: A | Discharge status: Home / Self Care | Payer: Medicare HMO | Source: Ambulatory Visit | Attending: Family Medicine | Admitting: Family Medicine

## 2021-12-20 DIAGNOSIS — Z780 Asymptomatic menopausal state: Secondary | ICD-10-CM | POA: Diagnosis not present

## 2021-12-20 DIAGNOSIS — Z1231 Encounter for screening mammogram for malignant neoplasm of breast: Secondary | ICD-10-CM

## 2021-12-20 DIAGNOSIS — E119 Type 2 diabetes mellitus without complications: Secondary | ICD-10-CM | POA: Diagnosis not present

## 2021-12-20 DIAGNOSIS — E2839 Other primary ovarian failure: Secondary | ICD-10-CM

## 2021-12-24 DIAGNOSIS — M799 Soft tissue disorder, unspecified: Secondary | ICD-10-CM | POA: Diagnosis not present

## 2021-12-24 DIAGNOSIS — M5431 Sciatica, right side: Secondary | ICD-10-CM | POA: Diagnosis not present

## 2021-12-24 DIAGNOSIS — M6281 Muscle weakness (generalized): Secondary | ICD-10-CM | POA: Diagnosis not present

## 2021-12-24 DIAGNOSIS — E669 Obesity, unspecified: Secondary | ICD-10-CM | POA: Diagnosis not present

## 2021-12-24 DIAGNOSIS — R262 Difficulty in walking, not elsewhere classified: Secondary | ICD-10-CM | POA: Diagnosis not present

## 2021-12-26 ENCOUNTER — Ambulatory Visit: Payer: Medicare HMO | Admitting: Adult Health

## 2021-12-26 ENCOUNTER — Encounter: Payer: Self-pay | Admitting: Adult Health

## 2021-12-26 VITALS — BP 124/82 | HR 59 | Ht 62.0 in | Wt 221.0 lb

## 2021-12-26 DIAGNOSIS — R053 Chronic cough: Secondary | ICD-10-CM | POA: Diagnosis not present

## 2021-12-26 DIAGNOSIS — J9611 Chronic respiratory failure with hypoxia: Secondary | ICD-10-CM | POA: Diagnosis not present

## 2021-12-26 DIAGNOSIS — G4733 Obstructive sleep apnea (adult) (pediatric): Secondary | ICD-10-CM

## 2021-12-26 DIAGNOSIS — Z0100 Encounter for examination of eyes and vision without abnormal findings: Secondary | ICD-10-CM | POA: Diagnosis not present

## 2021-12-26 NOTE — Assessment & Plan Note (Signed)
Postviral cough now resolved.

## 2021-12-26 NOTE — Assessment & Plan Note (Signed)
Secondary to underlying obstructive sleep apnea and OHS.-Continue on oxygen 2 L at bedtime with CPAP. Overnight oximetry test on room air on CPAP.

## 2021-12-26 NOTE — Progress Notes (Signed)
$'@Patient'e$  ID: Mary [name], female    DOB: 01/28/1955, 66 y.o.   MRN: [id number]  Chief Complaint  Patient presents with   Follow-up    Referring provider: Harlan Stains, MD  HPI: 66 year old female followed for obstructive sleep apnea and OHS on nocturnal CPAP with oxygen 2 L at bedtime She is retired from [employer] previously worked in Register department  TEST/EVENTS :  PSG 08/17/15 >> AHI 6.1, SpO2 low 89% ONO with 2 liters 12/20/15 >> test time 5 hrs 34 min.  Basal SpO2 94%, low SpO2 83%.  Spent 8 min with SpO2 < 88%. CPAP titration 05/15/16 >> CPAP 10 cm H2O >> AHI 1.2.  Did not need supplemental oxygen. HST 06/12/16 >> 17.2, SaO2 low 82% CPAP 05/17/17 to 06/15/17 >> used on 30 of 30 nights with average 8 hrs 11 min.  Average AHI 3.6 with CPAP 10 cm H2O  ONO with CPAP and 2 liters 10/23/16 >> test time 3 hrs 25 min.  Average SpO2 92%, low SpO2 89%.    Cardiac tests Echo 11/06/15 >> EF 55 to 60%, grade 2 DD  12/26/2021 Follow up : OSA/OHS, O2 RF  Patient presents for 23-monthfollow-up.  Patient has underlying sleep apnea and OHS on nocturnal CPAP with oxygen.  Patient says she is doing very well on CPAP.  Says she cannot sleep without it.  She feels that she benefits from CPAP with decreased daytime sleepiness.  Patient uses her CPAP at nighttime with oxygen at 2 L.  CPAP download shows excellent compliance with 100% usage.  Daily average usage at 7 hours.  Patient is on CPAP 10 cm H2O.  AHI is 1.2/hour. Insurance and DME are requiring her to have a overnight oximetry test on CPAP on room air to verify ongoing need for oxygen.  We discussed this and we will set up an overnight oximetry test. Full face mask.   Last visit patient had an ongoing cough after having viral illness.  Chest x-ray showed clear lungs.  Patient was recommended on cough control regimen.  Since last visit patient is feeling Much better.  Cough is totally resolved.  Patient says she has been doing well.  She  is been working on weight loss.  She is down 20 pounds.  Congratulated on her success.  She is really enjoying retirement.  She is also enjoying her grandchildren.  Allergies  Allergen Reactions   Aspirin Anaphylaxis and Swelling   Chicory Root [Cichorium Intybus] Anaphylaxis, Shortness Of Breath, Itching and Swelling    Swelling of tongue and throat. Scratch throat. cough   Nsaids Anaphylaxis and Swelling   Shellfish Allergy Anaphylaxis and Swelling   Celebrex [Celecoxib] Swelling    Facial swelling    Immunization History  Administered Date(s) Administered   Fluad Quad(high Dose 65+) 10/19/2020, 09/26/2021   Influenza Split 10/25/2015, 10/18/2016, 10/19/2017   Influenza,inj,quad, With Preservative 10/22/2016   PFIZER(Purple Top)SARS-COV-2 Vaccination 02/28/2019, 03/21/2019   Zoster Recombinat (Shingrix) 02/26/2018, 09/02/2018    Past Medical History:  Diagnosis Date   Anaphylaxis    Chicory root, shellfish   Arthritis    hands, knees, neck - no meds   Celiac disease    Depression    Fibromyalgia    GERD (gastroesophageal reflux disease)    no meds   Morbid obesity ([place] 10/19/2015   PONV (postoperative nausea and vomiting)    SVD (spontaneous vaginal delivery)    x 2   Systolic murmur 94/62/8638   Tobacco  History: Social History   Tobacco Use  Smoking Status Never  Smokeless Tobacco Never   Counseling given: Not Answered   Outpatient Medications Prior to Visit  Medication Sig Dispense Refill   acetaminophen (TYLENOL) 500 MG tablet Take 1,000 mg by mouth daily as needed for headache.     amoxicillin (AMOXIL) 500 MG capsule Take 4 capsules (2,000 mg total) by mouth 1 hour prior to dental appointment. 8 capsule 99   Ascorbic Acid (VITAMIN C PO) Take 1 tablet by mouth daily.     atorvastatin (LIPITOR) 20 MG tablet Take 20 mg by mouth daily.     Cholecalciferol (VITAMIN D3 PO) Take 1 capsule by mouth daily.     Cyanocobalamin (B-12 PO) Take 1 capsule by mouth  daily.     diclofenac sodium (VOLTAREN) 1 % GEL Apply 2 g topically daily as needed (for pain).     diphenhydrAMINE (BENADRYL) 25 MG tablet Take 25 mg by mouth 2 (two) times daily as needed for sleep, allergies or itching.     DULoxetine (CYMBALTA) 30 MG capsule Take 3 capsules (90 mg total) by mouth daily. 270 capsule 1   DULoxetine (CYMBALTA) 60 MG capsule Take 60 mg by mouth at bedtime.      EPINEPHrine 0.3 mg/0.3 mL IJ SOAJ injection Inject 0.3 mg into the muscle once as needed (allergic reaction).      EPINEPHrine 0.3 mg/0.3 mL IJ SOAJ injection Inject 0.3 mg into the muscle as needed for anaphylaxis. 2 each 1   glucose blood (FREESTYLE LITE) test strip Use to check blood sugar daily 100 strip 3   glucose blood (FREESTYLE LITE) test strip Use to test your blood sugar daily 100 strip 3   Lancets (FREESTYLE) lancets Use to check blood sugar daily 100 each 3   metFORMIN (GLUCOPHAGE-XR) 500 MG 24 hr tablet Take 1,000 mg by mouth daily with breakfast.     metroNIDAZOLE (METROCREAM) 0.75 % cream Apply a small amount to skin twice a day. 45 g 3   Omega-3 Fatty Acids (FISH OIL PO) Take 2 capsules by mouth daily.     Semaglutide, 1 MG/DOSE, (OZEMPIC, 1 MG/DOSE,) 4 MG/3ML SOPN Inject 1 mg into the skin once weekly. 3 mL 2   Semaglutide, 1 MG/DOSE, (OZEMPIC, 1 MG/DOSE,) 4 MG/3ML SOPN Inject 1 mg into the skin once a week. 3 mL 1   amoxicillin (AMOXIL) 500 MG capsule TAKE 4 CAPSULES BY MOUTH 1 HOUR PRIOR TO DENTAL APPOINTMENT. (Patient not taking: Reported on 07/11/2021) 8 capsule 99   No facility-administered medications prior to visit.     Review of Systems:   Constitutional:   No  weight loss, night sweats,  Fevers, chills, fatigue, or  lassitude.  HEENT:   No headaches,  Difficulty swallowing,  Tooth/dental problems, or  Sore throat,                No sneezing, itching, ear ache, nasal congestion, post nasal drip,   CV:  No chest pain,  Orthopnea, PND, swelling in lower extremities, anasarca,  dizziness, palpitations, syncope.   GI  No heartburn, indigestion, abdominal pain, nausea, vomiting, diarrhea, change in bowel habits, loss of appetite, bloody stools.   Resp: No shortness of breath with exertion or at rest.  No excess mucus, no productive cough,  No non-productive cough,  No coughing up of blood.  No change in color of mucus.  No wheezing.  No chest wall deformity  Skin: no rash or lesions.  GU:  no dysuria, change in color of urine, no urgency or frequency.  No flank pain, no hematuria   MS:  No joint pain or swelling.  No decreased range of motion.  No back pain.    Physical Exam  BP 124/82 (BP Location: Left Arm, Cuff Size: Normal)   Pulse (!) 59   Ht '5\' 2"'$  (1.575 m)   Wt 221 lb (100.2 kg)   SpO2 98%   BMI 40.42 kg/m   GEN: A/Ox3; pleasant , NAD, well nourished    HEENT:  [place]/AT,  , NOSE-clear, THROAT-clear, no lesions, no postnasal drip or exudate noted.  Class III NP airway  NECK:  Supple w/ fair ROM; no JVD; normal carotid impulses w/o bruits; no thyromegaly or nodules palpated; no lymphadenopathy.    RESP  Clear  P & A; w/o, wheezes/ rales/ or rhonchi. no accessory muscle use, no dullness to percussion  CARD:  RRR, no m/r/g, no peripheral edema, pulses intact, no cyanosis or clubbing.  GI:   Soft & nt; nml bowel sounds; no organomegaly or masses detected.   Musco: Warm bil, no deformities or joint swelling noted.   Neuro: alert, no focal deficits noted.    Skin: Warm, no lesions or rashes    Lab Results:  CBC   ProBNP No results found for: "PROBNP"  Imaging:         No data to display          No results found for: "NITRICOXIDE"      Assessment & Plan:   OSA (obstructive sleep apnea) Excellent control compliance on nocturnal CPAP. Will continue on nocturnal CPAP with oxygen at 2 L.  Does require a overnight oximetry test due to insurance requirements  Plan  Patient Instructions  Continue on CPAP At bedtime with  Oxygen 2l/m .  Set up overnight oximetry test on CPAP on room air.  Work on healthy weight (keep up good work) .  Do not drive if sleepy  Follow with Dr. Halford Chessman  Or Shondell Fabel NP in 1 year and As needed      Chronic respiratory failure with hypoxia ([place]) Secondary to underlying obstructive sleep apnea and OHS.-Continue on oxygen 2 L at bedtime with CPAP. Overnight oximetry test on room air on CPAP.  Chronic cough Postviral cough now resolved.  Morbid obesity ([place]) Continue with healthy weight loss     Rexene Edison, NP 12/26/2021

## 2021-12-26 NOTE — Patient Instructions (Signed)
Continue on CPAP At bedtime with Oxygen 2l/m .  Set up overnight oximetry test on CPAP on room air.  Work on healthy weight (keep up good work) .  Do not drive if sleepy  Follow with Dr. Halford Chessman  Or Erique Kaser NP in 1 year and As needed

## 2021-12-26 NOTE — Assessment & Plan Note (Signed)
Continue with healthy weight loss 

## 2021-12-26 NOTE — Assessment & Plan Note (Signed)
Excellent control compliance on nocturnal CPAP. Will continue on nocturnal CPAP with oxygen at 2 L.  Does require a overnight oximetry test due to insurance requirements  Plan  Patient Instructions  Continue on CPAP At bedtime with Oxygen 2l/m .  Set up overnight oximetry test on CPAP on room air.  Work on healthy weight (keep up good work) .  Do not drive if sleepy  Follow with Dr. Halford Chessman  Or Hamp Moreland NP in 1 year and As needed

## 2021-12-30 ENCOUNTER — Other Ambulatory Visit (HOSPITAL_COMMUNITY): Payer: Self-pay

## 2021-12-30 NOTE — Progress Notes (Signed)
Reviewed and agree with assessment/plan.   Chesley Mires, MD [facility] Pulmonary/Critical Care 12/30/2021, 7:04 AM Pager:  [phone]

## 2021-12-31 DIAGNOSIS — E669 Obesity, unspecified: Secondary | ICD-10-CM | POA: Diagnosis not present

## 2021-12-31 DIAGNOSIS — R262 Difficulty in walking, not elsewhere classified: Secondary | ICD-10-CM | POA: Diagnosis not present

## 2021-12-31 DIAGNOSIS — M5431 Sciatica, right side: Secondary | ICD-10-CM | POA: Diagnosis not present

## 2021-12-31 DIAGNOSIS — M6281 Muscle weakness (generalized): Secondary | ICD-10-CM | POA: Diagnosis not present

## 2021-12-31 DIAGNOSIS — M799 Soft tissue disorder, unspecified: Secondary | ICD-10-CM | POA: Diagnosis not present

## 2022-01-02 ENCOUNTER — Other Ambulatory Visit (HOSPITAL_COMMUNITY): Payer: Self-pay

## 2022-01-02 MED ORDER — OZEMPIC (1 MG/DOSE) 4 MG/3ML ~~LOC~~ SOPN
1.0000 mg | PEN_INJECTOR | SUBCUTANEOUS | 1 refills | Status: DC
Start: 2022-01-02 — End: 2022-04-29
  Filled 2022-01-02: qty 3, 28d supply, fill #0
  Filled 2022-01-29: qty 3, 28d supply, fill #1
  Filled 2022-02-28: qty 3, 28d supply, fill #2
  Filled 2022-03-25: qty 3, 28d supply, fill #3

## 2022-01-21 DIAGNOSIS — R262 Difficulty in walking, not elsewhere classified: Secondary | ICD-10-CM | POA: Diagnosis not present

## 2022-01-21 DIAGNOSIS — E669 Obesity, unspecified: Secondary | ICD-10-CM | POA: Diagnosis not present

## 2022-01-21 DIAGNOSIS — M5431 Sciatica, right side: Secondary | ICD-10-CM | POA: Diagnosis not present

## 2022-01-21 DIAGNOSIS — M6281 Muscle weakness (generalized): Secondary | ICD-10-CM | POA: Diagnosis not present

## 2022-01-21 DIAGNOSIS — M799 Soft tissue disorder, unspecified: Secondary | ICD-10-CM | POA: Diagnosis not present

## 2022-01-27 ENCOUNTER — Other Ambulatory Visit (HOSPITAL_COMMUNITY): Payer: Self-pay

## 2022-01-29 DIAGNOSIS — M5431 Sciatica, right side: Secondary | ICD-10-CM | POA: Diagnosis not present

## 2022-01-29 DIAGNOSIS — M799 Soft tissue disorder, unspecified: Secondary | ICD-10-CM | POA: Diagnosis not present

## 2022-01-29 DIAGNOSIS — E669 Obesity, unspecified: Secondary | ICD-10-CM | POA: Diagnosis not present

## 2022-01-29 DIAGNOSIS — R262 Difficulty in walking, not elsewhere classified: Secondary | ICD-10-CM | POA: Diagnosis not present

## 2022-01-29 DIAGNOSIS — M6281 Muscle weakness (generalized): Secondary | ICD-10-CM | POA: Diagnosis not present

## 2022-01-30 ENCOUNTER — Other Ambulatory Visit (HOSPITAL_COMMUNITY): Payer: Self-pay

## 2022-01-31 ENCOUNTER — Ambulatory Visit (HOSPITAL_BASED_OUTPATIENT_CLINIC_OR_DEPARTMENT_OTHER): Payer: Medicare HMO | Admitting: Cardiovascular Disease

## 2022-01-31 ENCOUNTER — Encounter (HOSPITAL_BASED_OUTPATIENT_CLINIC_OR_DEPARTMENT_OTHER): Payer: Self-pay | Admitting: Cardiovascular Disease

## 2022-01-31 ENCOUNTER — Other Ambulatory Visit (INDEPENDENT_AMBULATORY_CARE_PROVIDER_SITE_OTHER): Payer: Medicare HMO

## 2022-01-31 VITALS — BP 114/74 | HR 68 | Ht 62.0 in | Wt 221.9 lb

## 2022-01-31 DIAGNOSIS — R001 Bradycardia, unspecified: Secondary | ICD-10-CM

## 2022-01-31 DIAGNOSIS — G4733 Obstructive sleep apnea (adult) (pediatric): Secondary | ICD-10-CM

## 2022-01-31 HISTORY — DX: Bradycardia, unspecified: R00.1

## 2022-01-31 NOTE — Patient Instructions (Signed)
Medication Instructions:  Your physician recommends that you continue on your current medications as directed. Please refer to the Current Medication list given to you today.  *If you need a refill on your cardiac medications before your next appointment, please call your pharmacy*  Lab Work: CMET/CBC/TSH/FT4/MAGNESIUM TODAY   If you have labs (blood work) drawn today and your tests are completely normal, you will receive your results only by: [place] (if you have MyChart) OR A paper copy in the mail If you have any lab test that is abnormal or we need to change your treatment, we will call you to review the results.  Testing/Procedures: 7 DAY ZIO TODAY   Follow-Up: At [facility], you and your health needs are our priority.  As part of our continuing mission to provide you with exceptional heart care, we have created designated Provider Care Teams.  These Care Teams include your primary Cardiologist (physician) and Advanced Practice Providers (APPs -  Physician Assistants and Nurse Practitioners) who all work together to provide you with the care you need, when you need it.  We recommend signing up for the patient portal called "MyChart".  Sign up information is provided on this After Visit Summary.  MyChart is used to connect with patients for Virtual Visits (Telemedicine).  Patients are able to view lab/test results, encounter notes, upcoming appointments, etc.  Non-urgent messages can be sent to your provider as well.   To learn more about what you can do with MyChart, go to [email or web address].    Your next appointment:   1-2  month(s)  Provider:   Laurann Montana, NP    Other Instructions [place]n [place]- Long Term Monitor Instructions  Your physician has requested you wear a ZIO patch monitor for 14 days.  This is a single patch monitor. [employer] supplies one patch monitor per enrollment. Additional stickers are not available. Please do not apply patch if you will be  having a Nuclear Stress Test,  Echocardiogram, Cardiac CT, MRI, or Chest Xray during the period you would be wearing the  monitor. The patch cannot be worn during these tests. You cannot remove and re-apply the  ZIO XT patch monitor.  Your ZIO patch monitor will be mailed 3 day USPS to your address on file. It may take 3-5 days  to receive your monitor after you have been enrolled.  Once you have received your monitor, please review the enclosed instructions. Your monitor  has already been registered assigning a specific monitor serial # to you.  Billing and Patient Assistance Program Information  We have supplied [employer] with any of your insurance information on file for billing purposes. [employer] offers a sliding scale Patient Assistance Program for patients that do not have  insurance, or whose insurance does not completely cover the cost of the ZIO monitor.  You must apply for the Patient Assistance Program to qualify for this discounted rate.  To apply, please call [employer] at [phone], select option 4, select option 2, ask to apply for  Patient Assistance Program. Theodore Demark will ask your household income, and how many people  are in your household. They will quote your out-of-pocket cost based on that information.  [employer] will also be able to set up a 10-month interest-free payment plan if needed.  Applying the monitor   Shave hair from upper left chest.  Hold abrader disc by orange tab. Rub abrader in 40 strokes over the upper left chest as  indicated in your monitor instructions.  Clean area with 4 enclosed alcohol pads. Let dry.  Apply patch as indicated in monitor instructions. Patch will be placed under collarbone on left  side of chest with arrow pointing upward.  Rub patch adhesive wings for 2 minutes. Remove white label marked "1". Remove the white  label marked "2". Rub patch adhesive wings for 2 additional minutes.  While looking in a mirror, press and release button  in center of patch. A small green light will  flash 3-4 times. This will be your only indicator that the monitor has been turned on.  Do not shower for the first 24 hours. You may shower after the first 24 hours.  Press the button if you feel a symptom. You will hear a small click. Record Date, Time and  Symptom in the Patient Logbook.  When you are ready to remove the patch, follow instructions on the last 2 pages of Patient  Logbook. Stick patch monitor onto the last page of Patient Logbook.  Place Patient Logbook in the blue and white box. Use locking tab on box and tape box closed  securely. The blue and white box has prepaid postage on it. Please place it in the mailbox as  soon as possible. Your physician should have your test results approximately 7 days after the  monitor has been mailed back to [facility].  Call Bell Arthur at [phone] if you have questions regarding  your ZIO XT patch monitor. Call them immediately if you see an orange light blinking on your  monitor.  If your monitor falls off in less than 4 days, contact our Monitor department at [phone].  If your monitor becomes loose or falls off after 4 days call [employer] at [phone] for  suggestions on securing your monitor

## 2022-01-31 NOTE — Assessment & Plan Note (Signed)
Continue CPAP.

## 2022-01-31 NOTE — Assessment & Plan Note (Addendum)
Intermittent bradycardia noted with PCP and since that time.  She thinks that the EKG showed ventricular bigeminy.  It is unclear whether her heart rate was actually 40 or if it was 80 bpm and in a bigeminal rhythm. Check CMP, magnesium, and TSH.  Continue to use CPAP.  No ischemic evaluation at this time.  LHC showed normal coronary arteries in 2017. 7 day Zio monitor.  Orthostatic vital signs were unremarkable.

## 2022-01-31 NOTE — Progress Notes (Signed)
Cardiology Office Note:    Date:  01/31/2022   ID:  Mary [name], DOB [birth year], MRN [id number]  PCP:  Mary Stains, MD   [facility] Providers Cardiologist:  Mary Latch, MD     Referring MD: Mary Stains, MD    History of Present Illness:    Mary [name] is a 67 y.o. female with a hx of hyperlipidemia, morbid obesity, and OSA. She was initially seen in 2017 for jaw pain. ETT was non-diagnostic. She was referred for an ETT 11/06/15 that was nondiagnostic due to baseline EKG changes. She was then referred for an exercise Myoview that revealed LVEF 68% with a small apical septal defect concerning for ischemia. She reported a remote cath that was negative. She had a repeat cath 11/2015 that showed normal LVEF and no CAD. She saw her PCP 11/2021 and reported increasing sleepiness. She was compliant with her CPAP. She was noted to be bradycardic and was referred to cardiology.  Today, she reports that when she visited her PCP recently her heart rate was in the 40s, Her EKG reportedly had multiple PVC's. At home she has monitored her pulse as low as 35-40 bpm and as high as the 120s per her pulse ox. Most of the time her heart rate is normal. Occasionally she may have mild chest discomfort, but states this is nothing extreme and is not bothersome. One of her main complaints today is dizziness/lightheadedness. She has had more frequent episodes at least every few days, or as much as once a day. Yesterday while walking in a store she suddenly felt dizzy and needed to hold on to something. She denies ever losing consciousness, and has not needed to grab on to something to prevent a fall. Today she is more lightheaded than usual. Generally, she is often walking when she becomes lightheaded or dizzy, but not necessarily walking for exercise. Her symptoms also occur while she is sitting down. Due to her known diabetes, she had previously thought her dizziness was related to her blood sugars.  Regarding her diet, she drinks an occasional diet coke, but otherwise has no caffeine. Also she endorses spinal stenosis with sciatic pain in her legs. Generally she feels much more limited by this pain than her lightheadedness.  She participates in PT twice a week. She tries to swim for exercise when able, and she is working on increasing her walking. Lost about 20 lbs in past few months so she is feeling better and motivated to keep exercising. Enjoys gardening. Every night she wears a CPAP with oxygen due to prior nocturnal hypoxia. Lately she has been sleeping for longer, up to 10 hours a night, which seems strange to her. She denies daytime somnolence. However, she also states that she has days where she can wake up from a good night's sleep, eat breakfast, then immediately fall back asleep. She denies any palpitations, shortness of breath, or peripheral edema. No headaches, syncope, orthopnea, or PND.   Past Medical History:  Diagnosis Date   Anaphylaxis    Chicory root, shellfish   Arthritis    hands, knees, neck - no meds   Bradycardia 01/31/2022   Celiac disease    Depression    Fibromyalgia    GERD (gastroesophageal reflux disease)    no meds   Morbid obesity ([place]) 10/19/2015   PONV (postoperative nausea and vomiting)    SVD (spontaneous vaginal delivery)    x 2   Systolic murmur 47/65/4650    Past Surgical  History:  Procedure Laterality Date   BILATERAL SALPINGECTOMY Bilateral 03/30/2013   Procedure: BILATERAL SALPINGECTOMY;  Surgeon: Thurnell Lose, MD;  Location: [facility];  Service: Gynecology;  Laterality: Bilateral;   CARDIAC CATHETERIZATION N/A 12/07/2015   Procedure: Left Heart Cath and Coronary Angiography;  Surgeon: Leonie Man, MD;  Location: [facility];  Service: Cardiovascular;  Laterality: N/A;   COLONOSCOPY WITH PROPOFOL N/A 02/17/2020   Procedure: COLONOSCOPY WITH PROPOFOL;  Surgeon: Carol Ada, MD;  Location: [facility];  Service: Endoscopy;   Laterality: N/A;   dermoid cystect     HYSTEROSCOPY WITH D & C  01/28/2012   Procedure: DILATATION AND CURETTAGE /HYSTEROSCOPY;  Surgeon: Thurnell Lose, MD;  Location: [facility];  Service: Gynecology;  Laterality: N/A;   LAPAROSCOPIC ASSISTED VAGINAL HYSTERECTOMY N/A 03/30/2013   Procedure: LAPAROSCOPIC ASSISTED VAGINAL HYSTERECTOMY;  Surgeon: Thurnell Lose, MD;  Location: [facility];  Service: Gynecology;  Laterality: N/A;   POLYPECTOMY  02/17/2020   Procedure: POLYPECTOMY;  Surgeon: Carol Ada, MD;  Location: [facility];  Service: Endoscopy;;   REPLACEMENT TOTAL KNEE     bilateral   SPINAL FUSION     C3-C4   WISDOM TOOTH EXTRACTION      Current Medications: Current Meds  Medication Sig   acetaminophen (TYLENOL) 500 MG tablet Take 1,000 mg by mouth daily as needed for headache.   amoxicillin (AMOXIL) 500 MG capsule Take 4 capsules (2,000 mg total) by mouth 1 hour prior to dental appointment.   Ascorbic Acid (VITAMIN C PO) Take 1 tablet by mouth daily.   atorvastatin (LIPITOR) 20 MG tablet Take 20 mg by mouth daily.   Cholecalciferol (VITAMIN D3 PO) Take 1 capsule by mouth daily.   Cyanocobalamin (B-12 PO) Take 1 capsule by mouth daily.   diclofenac sodium (VOLTAREN) 1 % GEL Apply 2 g topically daily as needed (for pain).   diphenhydrAMINE (BENADRYL) 25 MG tablet Take 25 mg by mouth 2 (two) times daily as needed for sleep, allergies or itching.   DULoxetine (CYMBALTA) 30 MG capsule Take 3 capsules (90 mg total) by mouth daily.   EPINEPHrine 0.3 mg/0.3 mL IJ SOAJ injection Inject 0.3 mg into the muscle as needed for anaphylaxis.   glucose blood (FREESTYLE LITE) test strip Use to test your blood sugar daily   Lancets (FREESTYLE) lancets Use to check blood sugar daily   metFORMIN (GLUCOPHAGE-XR) 500 MG 24 hr tablet Take 1,000 mg by mouth daily with breakfast.   metroNIDAZOLE (METROCREAM) 0.75 % cream Apply a small amount to skin twice a day.   Naltrexone HCl, Pain, 4.5 MG CAPS Take 1 capsule  by mouth daily at 12 noon.   Omega-3 Fatty Acids (FISH OIL PO) Take 2 capsules by mouth daily.   Semaglutide, 1 MG/DOSE, (OZEMPIC, 1 MG/DOSE,) 4 MG/3ML SOPN Inject 1 mg into the skin once a week.     Allergies:   Aspirin, Chicory root [cichorium intybus], Nsaids, Shellfish allergy, and Celebrex [celecoxib]   Social History   Socioeconomic History   Marital status: Divorced    Spouse name: Not on file   Number of children: Not on file   Years of education: Not on file   Highest education level: Not on file  Occupational History   Not on file  Tobacco Use   Smoking status: Never   Smokeless tobacco: Never  Substance and Sexual Activity   Alcohol use: Yes    Comment: occasionally   Drug use: No   Sexual activity: Yes  Birth control/protection: Post-menopausal  Other Topics Concern   Not on file  Social History Narrative   Divorced   Lives alone   Recent cruise to [place] x1 week Sept 2017   Social Determinants of Health   Financial Resource Strain: Not on file  Food Insecurity: Not on file  Transportation Needs: Not on file  Physical Activity: Not on file  Stress: Not on file  Social Connections: Not on file     Family History: The patient's family history includes Atrial fibrillation in her mother; Bradycardia in her mother; Breast cancer in her maternal grandmother; Leukemia in her father.  ROS:   Please see the history of present illness.    (+) Dizziness/lightheadedness (+) Occasional chest discomfort (+) Sciatic pain All other systems reviewed and are negative.  EKGs/Labs/Other Studies Reviewed:    The following studies were reviewed today:  Left Heart Cath  12/07/2015: The left ventricular systolic function is normal. LV end diastolic pressure is normal. The left ventricular ejection fraction is 55-65% by visual estimate.   Angiographically normal coronary arteries. No culprit lesion to explain anteroapical lesion.   Nonanginal chest pain.    Echocardiographically only mild mitral regurgitation, however by LV gram that it appeared to be at least 3+, possibly catheter related MR.   Plan: Standard post radial cath care with TR band removal and discharge later on today.   [place] Stress Myoview  11/29/2015: The left ventricular ejection fraction is hyperdynamic (>65%). Nuclear stress EF: 68%. There was no ST segment deviation noted during stress. Defect 1: There is a small defect of mild severity present in the apical septal location. Findings consistent with ischemia. This is a low risk study.  ETT  11/06/2015: Blood pressure demonstrated a hypertensive response to exercise. There was downsloping ST segments with T wave inversions in the inferior leads that became more pronounced at peak exercise. This was also seen in the lateral precordial leads at peak exerxcise. Nondiagnsotic EKG due to baseline EKG changes. Recommend stress myoview. Mildly impaired exericse tolerance at 6.2 mets.  Echo  11/06/2015: Study Conclusions  - Left ventricle: The cavity size was normal. Wall thickness was    normal. Systolic function was normal. The estimated ejection    fraction was in the range of 55% to 60%. Wall motion was normal;    there were no regional wall motion abnormalities. Features are    consistent with a pseudonormal left ventricular filling pattern,    with concomitant abnormal relaxation and increased filling    pressure (grade 2 diastolic dysfunction).   Impressions:  - Normal LV systolic function; grade 2 diastolic dysfunction; trace    MR and TR.    EKG:  EKG is personally reviewed. 01/31/2022: Sinus rhythm. Rate 68 bpm. 10/19/2015: sinus rhythm. Rate 79 bpm. T-wave in the [place].  09/17/15: Sinus rhythm. Rate 79 bpm. Nonspecific T wave abnormalities.    Recent Labs: No results found for requested labs within last 365 days.   Recent Lipid Panel    Component Value Date/Time   CHOL 201 (H) 07/16/2010 0410    TRIG 167 (H) 07/16/2010 0410   HDL 44 07/16/2010 0410   CHOLHDL 4.6 07/16/2010 0410   VLDL 33 07/16/2010 0410   LDLCALC 124 (H) 07/16/2010 0410     Risk Assessment/Calculations:           Physical Exam:    Wt Readings from Last 3 Encounters:  01/31/22 221 lb 14.4 oz (100.7 kg)  12/26/21 221 lb (  100.2 kg)  08/29/21 241 lb 2 oz (109.4 kg)     VS:  BP 114/74 (BP Location: Left Arm, Patient Position: Sitting, Cuff Size: Large)   Pulse 68   Ht '5\' 2"'$  (1.575 m)   Wt 221 lb 14.4 oz (100.7 kg)   BMI 40.59 kg/m  , BMI Body mass index is 40.59 kg/m. GENERAL:  Well appearing HEENT: Pupils equal round and reactive, fundi not visualized, oral mucosa unremarkable NECK:  No jugular venous distention, waveform within normal limits, carotid upstroke brisk and symmetric, no bruits, no thyromegaly LUNGS:  Clear to auscultation bilaterally HEART:  RRR.  PMI not displaced or sustained,S1 and S2 within normal limits, no S3, no S4, no clicks, no rubs, no murmurs ABD:  Flat, positive bowel sounds normal in frequency in pitch, no bruits, no rebound, no guarding, no midline pulsatile mass, no hepatomegaly, no splenomegaly EXT:  2 plus pulses throughout, no edema, no cyanosis no clubbing SKIN:  No rashes no nodules NEURO:  Cranial nerves II through XII grossly intact, motor grossly intact throughout PSYCH:  Cognitively intact, oriented to person place and time   ASSESSMENT:    1. Bradycardia   2. OSA (obstructive sleep apnea)    PLAN:    Bradycardia Intermittent bradycardia noted with PCP and since that time.  She thinks that the EKG showed ventricular bigeminy.  It is unclear whether her heart rate was actually 40 or if it was 80 bpm and in a bigeminal rhythm. Check CMP, magnesium, and TSH.  Continue to use CPAP.  No ischemic evaluation at this time.  LHC showed normal coronary arteries in 2017. 7 day Zio monitor.  Orthostatic vital signs were unremarkable.  OSA (obstructive sleep  apnea) Continue CPAP.   Disposition: FU with APP in 1-2 months.  Medication Adjustments/Labs and Tests Ordered: Current medicines are reviewed at length with the patient today.  Concerns regarding medicines are outlined above.   Orders Placed This Encounter  Procedures   CBC with Differential/Platelet   T4, free   TSH   Magnesium   Comprehensive metabolic panel   LONG TERM MONITOR (3-14 DAYS)   No orders of the defined types were placed in this encounter.  Patient Instructions  Medication Instructions:  Your physician recommends that you continue on your current medications as directed. Please refer to the Current Medication list given to you today.  *If you need a refill on your cardiac medications before your next appointment, please call your pharmacy*  Lab Work: CMET/CBC/TSH/FT4/MAGNESIUM TODAY   If you have labs (blood work) drawn today and your tests are completely normal, you will receive your results only by: [place] (if you have MyChart) OR A paper copy in the mail If you have any lab test that is abnormal or we need to change your treatment, we will call you to review the results.  Testing/Procedures: 7 DAY ZIO TODAY   Follow-Up: At [facility], you and your health needs are our priority.  As part of our continuing mission to provide you with exceptional heart care, we have created designated Provider Care Teams.  These Care Teams include your primary Cardiologist (physician) and Advanced Practice Providers (APPs -  Physician Assistants and Nurse Practitioners) who all work together to provide you with the care you need, when you need it.  We recommend signing up for the patient portal called "MyChart".  Sign up information is provided on this After Visit Summary.  MyChart is used to connect with  patients for Virtual Visits (Telemedicine).  Patients are able to view lab/test results, encounter notes, upcoming appointments, etc.  Non-urgent messages  can be sent to your provider as well.   To learn more about what you can do with MyChart, go to [email or web address].    Your next appointment:   1-2  month(s)  Provider:   Laurann Montana, NP    Other Instructions [facility]- Long Term Monitor Instructions  Your physician has requested you wear a ZIO patch monitor for 14 days.  This is a single patch monitor. [employer] supplies one patch monitor per enrollment. Additional stickers are not available. Please do not apply patch if you will be having a Nuclear Stress Test,  Echocardiogram, Cardiac CT, MRI, or Chest Xray during the period you would be wearing the  monitor. The patch cannot be worn during these tests. You cannot remove and re-apply the  ZIO XT patch monitor.  Your ZIO patch monitor will be mailed 3 day USPS to your address on file. It may take 3-5 days  to receive your monitor after you have been enrolled.  Once you have received your monitor, please review the enclosed instructions. Your monitor  has already been registered assigning a specific monitor serial # to you.  Billing and Patient Assistance Program Information  We have supplied [employer] with any of your insurance information on file for billing purposes. [employer] offers a sliding scale Patient Assistance Program for patients that do not have  insurance, or whose insurance does not completely cover the cost of the ZIO monitor.  You must apply for the Patient Assistance Program to qualify for this discounted rate.  To apply, please call [employer] at [phone], select option 4, select option 2, ask to apply for  Patient Assistance Program. Theodore Demark will ask your household income, and how many people  are in your household. They will quote your out-of-pocket cost based on that information.  [employer] will also be able to set up a 71-month interest-free payment plan if needed.  Applying the monitor   Shave hair from upper left chest.  Hold abrader disc by orange tab.  Rub abrader in 40 strokes over the upper left chest as  indicated in your monitor instructions.  Clean area with 4 enclosed alcohol pads. Let dry.  Apply patch as indicated in monitor instructions. Patch will be placed under collarbone on left  side of chest with arrow pointing upward.  Rub patch adhesive wings for 2 minutes. Remove white label marked "1". Remove the white  label marked "2". Rub patch adhesive wings for 2 additional minutes.  While looking in a mirror, press and release button in center of patch. A small green light will  flash 3-4 times. This will be your only indicator that the monitor has been turned on.  Do not shower for the first 24 hours. You may shower after the first 24 hours.  Press the button if you feel a symptom. You will hear a small click. Record Date, Time and  Symptom in the Patient Logbook.  When you are ready to remove the patch, follow instructions on the last 2 pages of Patient  Logbook. Stick patch monitor onto the last page of Patient Logbook.  Place Patient Logbook in the blue and white box. Use locking tab on box and tape box closed  securely. The blue and white box has prepaid postage on it. Please place it in the mailbox as  soon as possible. Your physician should have your  test results approximately 7 days after the  monitor has been mailed back to [facility].  Call [place] at [phone] if you have questions regarding  your ZIO XT patch monitor. Call them immediately if you see an orange light blinking on your  monitor.  If your monitor falls off in less than 4 days, contact our Monitor department at [phone].  If your monitor becomes loose or falls off after 4 days call [employer] at [phone] for  suggestions on securing your monitor    I,Mathew Stumpf,acting as a scribe for Mary Latch, MD.,have documented all relevant documentation on the behalf of Mary Latch, MD,as directed by  Mary Latch,  MD while in the presence of Mary Latch, MD.  I, [place], MD have reviewed all documentation for this visit.  The documentation of the exam, diagnosis, procedures, and orders on 01/31/2022 are all accurate and complete.   Signed, Mary Latch, MD  01/31/2022 5:32 PM    [place]

## 2022-02-01 LAB — CBC WITH DIFFERENTIAL/PLATELET
Basophils Absolute: 0.1 10*3/uL (ref 0.0–0.2)
Basos: 1 %
EOS (ABSOLUTE): 0.1 10*3/uL (ref 0.0–0.4)
Eos: 2 %
Hematocrit: 45 % (ref 34.0–46.6)
Hemoglobin: 14.6 g/dL (ref 11.1–15.9)
Immature Grans (Abs): 0 10*3/uL (ref 0.0–0.1)
Immature Granulocytes: 0 %
Lymphocytes Absolute: 2.4 10*3/uL (ref 0.7–3.1)
Lymphs: 32 %
MCH: 29.1 pg (ref 26.6–33.0)
MCHC: 32.4 g/dL (ref 31.5–35.7)
MCV: 90 fL (ref 79–97)
Monocytes Absolute: 0.5 10*3/uL (ref 0.1–0.9)
Monocytes: 6 %
Neutrophils Absolute: 4.4 10*3/uL (ref 1.4–7.0)
Neutrophils: 59 %
Platelets: 323 10*3/uL (ref 150–450)
RBC: 5.01 x10E6/uL (ref 3.77–5.28)
RDW: 13 % (ref 11.7–15.4)
WBC: 7.4 10*3/uL (ref 3.4–10.8)

## 2022-02-01 LAB — COMPREHENSIVE METABOLIC PANEL
ALT: 30 IU/L (ref 0–32)
AST: 27 IU/L (ref 0–40)
Albumin/Globulin Ratio: 1.7 (ref 1.2–2.2)
Albumin: 4.3 g/dL (ref 3.9–4.9)
Alkaline Phosphatase: 63 IU/L (ref 44–121)
BUN/Creatinine Ratio: 11 — ABNORMAL LOW (ref 12–28)
BUN: 9 mg/dL (ref 8–27)
Bilirubin Total: 2.3 mg/dL — ABNORMAL HIGH (ref 0.0–1.2)
CO2: 25 mmol/L (ref 20–29)
Calcium: 9.8 mg/dL (ref 8.7–10.3)
Chloride: 101 mmol/L (ref 96–106)
Creatinine, Ser: 0.82 mg/dL (ref 0.57–1.00)
Globulin, Total: 2.5 g/dL (ref 1.5–4.5)
Glucose: 88 mg/dL (ref 70–99)
Potassium: 4.4 mmol/L (ref 3.5–5.2)
Sodium: 144 mmol/L (ref 134–144)
Total Protein: 6.8 g/dL (ref 6.0–8.5)
eGFR: 78 mL/min/{1.73_m2} (ref >59)

## 2022-02-01 LAB — TSH: TSH: 1.06 u[IU]/mL (ref 0.450–4.500)

## 2022-02-01 LAB — MAGNESIUM: Magnesium: 1.9 mg/dL (ref 1.6–2.3)

## 2022-02-01 LAB — T4, FREE: Free T4: 1.14 ng/dL (ref 0.82–1.77)

## 2022-02-03 ENCOUNTER — Encounter (HOSPITAL_BASED_OUTPATIENT_CLINIC_OR_DEPARTMENT_OTHER): Payer: Self-pay

## 2022-02-03 NOTE — Addendum Note (Signed)
Addended by: Gerald Stabs on: 02/03/2022 10:18 AM   Modules accepted: Orders

## 2022-02-11 DIAGNOSIS — E669 Obesity, unspecified: Secondary | ICD-10-CM | POA: Diagnosis not present

## 2022-02-11 DIAGNOSIS — M5431 Sciatica, right side: Secondary | ICD-10-CM | POA: Diagnosis not present

## 2022-02-11 DIAGNOSIS — M799 Soft tissue disorder, unspecified: Secondary | ICD-10-CM | POA: Diagnosis not present

## 2022-02-11 DIAGNOSIS — M6281 Muscle weakness (generalized): Secondary | ICD-10-CM | POA: Diagnosis not present

## 2022-02-11 DIAGNOSIS — R262 Difficulty in walking, not elsewhere classified: Secondary | ICD-10-CM | POA: Diagnosis not present

## 2022-02-18 DIAGNOSIS — R262 Difficulty in walking, not elsewhere classified: Secondary | ICD-10-CM | POA: Diagnosis not present

## 2022-02-18 DIAGNOSIS — M5431 Sciatica, right side: Secondary | ICD-10-CM | POA: Diagnosis not present

## 2022-02-18 DIAGNOSIS — M799 Soft tissue disorder, unspecified: Secondary | ICD-10-CM | POA: Diagnosis not present

## 2022-02-18 DIAGNOSIS — M6281 Muscle weakness (generalized): Secondary | ICD-10-CM | POA: Diagnosis not present

## 2022-02-18 DIAGNOSIS — E669 Obesity, unspecified: Secondary | ICD-10-CM | POA: Diagnosis not present

## 2022-02-25 DIAGNOSIS — E669 Obesity, unspecified: Secondary | ICD-10-CM | POA: Diagnosis not present

## 2022-02-25 DIAGNOSIS — M6281 Muscle weakness (generalized): Secondary | ICD-10-CM | POA: Diagnosis not present

## 2022-02-25 DIAGNOSIS — M799 Soft tissue disorder, unspecified: Secondary | ICD-10-CM | POA: Diagnosis not present

## 2022-02-25 DIAGNOSIS — M5431 Sciatica, right side: Secondary | ICD-10-CM | POA: Diagnosis not present

## 2022-02-25 DIAGNOSIS — R262 Difficulty in walking, not elsewhere classified: Secondary | ICD-10-CM | POA: Diagnosis not present

## 2022-03-02 NOTE — Progress Notes (Signed)
 " Cardiology Office Note:    Date:  03/03/2022   ID:  Mary [name], DOB April 12, 1955, MRN [id number]  PCP:  Teresa Channel, MD   [facility] Providers Cardiologist:  Annabella Scarce, MD     Referring MD: Teresa Channel, MD   No chief complaint on file.   History of Present Illness:    Mary [name] is a 67 y.o. female with a hx of hyperlipidemia, OSA 2 , bradycardia, bigeminy.  Last seen in the office on 01/31/2022 by Dr. Scarce, at that time she had recently been evaluated by her PCP and her heart rate was noted to be in the 40s.  At home she monitors her heart rate and noted it had been anywhere from 35 and as high as 120.  Twelve-lead EKG reviewed from her PCP office showed bigeminy.  She was also experiencing some dizziness having excessive daytime sleepiness in spite of wearing her CPAP for up to 10 hours a night.  A 7-day ZIO monitor was ordered, as well as lab work--which were overall unremarkable.  Monitor has not yet been resulted, confirmed with ZIO that the results will be available this week.  She presents today for follow up of her monitor results, however the results have not yet been resulted. She reports that she is feeling well, occasional dizziness. Dizziness occurs when she changes positions suddenly. She thinks she may not be getting enough oral hydration. She has lost 30 lbs with the help of Ozempic  and no longer has any issues with pedal edema since. She is working with PT to help with mobility, and has started trying to walk ~ 1 mile/day. She denies chest pain, palpitations, dyspnea, pnd, orthopnea, n, v, syncope, edema, weight gain, or early satiety.    Past Medical History:  Diagnosis Date   Anaphylaxis    Chicory root, shellfish   Arthritis    hands, knees, neck - no meds   Bradycardia 01/31/2022   Celiac disease    Depression    Fibromyalgia    GERD (gastroesophageal reflux disease)    no meds   Morbid obesity (HCC) 10/19/2015   PONV  (postoperative nausea and vomiting)    SVD (spontaneous vaginal delivery)    x 2   Systolic murmur 10/19/2015    Past Surgical History:  Procedure Laterality Date   BILATERAL SALPINGECTOMY Bilateral 03/30/2013   Procedure: BILATERAL SALPINGECTOMY;  Surgeon: Hargis Paradise, MD;  Location: [facility];  Service: Gynecology;  Laterality: Bilateral;   CARDIAC CATHETERIZATION N/A 12/07/2015   Procedure: Left Heart Cath and Coronary Angiography;  Surgeon: Alm LELON Clay, MD;  Location: [facility] INVASIVE CV LAB;  Service: Cardiovascular;  Laterality: N/A;   COLONOSCOPY WITH PROPOFOL  N/A 02/17/2020   Procedure: COLONOSCOPY WITH PROPOFOL ;  Surgeon: Rollin Dover, MD;  Location: [facility];  Service: Endoscopy;  Laterality: N/A;   dermoid cystect     HYSTEROSCOPY WITH D & C  01/28/2012   Procedure: DILATATION AND CURETTAGE /HYSTEROSCOPY;  Surgeon: Hargis Paradise, MD;  Location: [facility];  Service: Gynecology;  Laterality: N/A;   LAPAROSCOPIC ASSISTED VAGINAL HYSTERECTOMY N/A 03/30/2013   Procedure: LAPAROSCOPIC ASSISTED VAGINAL HYSTERECTOMY;  Surgeon: Hargis Paradise, MD;  Location: [facility];  Service: Gynecology;  Laterality: N/A;   POLYPECTOMY  02/17/2020   Procedure: POLYPECTOMY;  Surgeon: Rollin Dover, MD;  Location: [facility];  Service: Endoscopy;;   REPLACEMENT TOTAL KNEE     bilateral   SPINAL FUSION     C3-C4   WISDOM TOOTH EXTRACTION  Current Medications: Current Meds  Medication Sig   acetaminophen  (TYLENOL ) 500 MG tablet Take 1,000 mg by mouth daily as needed for headache.   amoxicillin  (AMOXIL ) 500 MG capsule Take 4 capsules (2,000 mg total) by mouth 1 hour prior to dental appointment.   Ascorbic Acid (VITAMIN C PO) Take 1 tablet by mouth daily.   atorvastatin  (LIPITOR) 20 MG tablet Take 20 mg by mouth daily.   Cholecalciferol (VITAMIN D3 PO) Take 1 capsule by mouth daily.   Cyanocobalamin  (B-12 PO) Take 1 capsule by mouth daily.   diclofenac sodium (VOLTAREN) 1 % GEL Apply 2 g topically  daily as needed (for pain).   diphenhydrAMINE  (BENADRYL ) 25 MG tablet Take 25 mg by mouth 2 (two) times daily as needed for sleep, allergies or itching.   DULoxetine  (CYMBALTA ) 30 MG capsule Take 3 capsules (90 mg total) by mouth daily.   EPINEPHrine  0.3 mg/0.3 mL IJ SOAJ injection Inject 0.3 mg into the muscle as needed for anaphylaxis.   glucose blood (FREESTYLE LITE) test strip Use to test your blood sugar daily   Lancets (FREESTYLE) lancets Use to check blood sugar daily   metFORMIN  (GLUCOPHAGE -XR) 500 MG 24 hr tablet Take 1,000 mg by mouth daily with breakfast.   metroNIDAZOLE  (METROCREAM ) 0.75 % cream Apply a small amount to skin twice a day.   Naltrexone HCl, Pain, 4.5 MG CAPS Take 1 capsule by mouth daily at 12 noon.   Omega-3 Fatty Acids (FISH OIL PO) Take 2 capsules by mouth daily.   Semaglutide , 1 MG/DOSE, (OZEMPIC , 1 MG/DOSE,) 4 MG/3ML SOPN Inject 1 mg into the skin once a week.     Allergies:   Aspirin, Chicory root [cichorium intybus], Nsaids, Shellfish allergy , and Celebrex [celecoxib]   Social History   Socioeconomic History   Marital status: Divorced    Spouse name: Not on file   Number of children: Not on file   Years of education: Not on file   Highest education level: Not on file  Occupational History   Not on file  Tobacco Use   Smoking status: Never   Smokeless tobacco: Never  Substance and Sexual Activity   Alcohol use: Yes    Comment: occasionally   Drug use: No   Sexual activity: Yes    Birth control/protection: Post-menopausal  Other Topics Concern   Not on file  Social History Narrative   Divorced   Lives alone   Recent cruise to [place] x1 week Sept 2017   Social Determinants of Health   Financial Resource Strain: Not on file  Food Insecurity: Not on file  Transportation Needs: Not on file  Physical Activity: Not on file  Stress: Not on file  Social Connections: Not on file     Family History: The patient's family history includes  Atrial fibrillation in her mother; Bradycardia in her mother; Breast cancer in her maternal grandmother; Leukemia in her father.  ROS:   Please see the history of present illness.    All other systems reviewed and are negative.  EKGs/Labs/Other Studies Reviewed:    The following studies were reviewed today:   EKG:  EKG is not ordered today.    Recent Labs: 01/31/2022: ALT 30; BUN 9; Creatinine, Ser 0.82; Hemoglobin 14.6; Magnesium 1.9; Platelets 323; Potassium 4.4; Sodium 144; TSH 1.060  Recent Lipid Panel    Component Value Date/Time   CHOL 201 (H) 07/16/2010 0410   TRIG 167 (H) 07/16/2010 0410   HDL 44 07/16/2010 0410  CHOLHDL 4.6 07/16/2010 0410   VLDL 33 07/16/2010 0410   LDLCALC 124 (H) 07/16/2010 0410     Risk Assessment/Calculations:                Physical Exam:    VS:  BP 120/70   Pulse 71   Ht 5' 2 (1.575 m)   Wt 219 lb (99.3 kg)   BMI 40.06 kg/m     Wt Readings from Last 3 Encounters:  03/03/22 219 lb (99.3 kg)  01/31/22 221 lb 14.4 oz (100.7 kg)  12/26/21 221 lb (100.2 kg)     GEN:  Well nourished, well developed in no acute distress HEENT: Normal NECK: No JVD; No carotid bruits LYMPHATICS: No lymphadenopathy CARDIAC: RRR, no murmurs, rubs, gallops RESPIRATORY:  Clear to auscultation without rales, wheezing or rhonchi  ABDOMEN: Soft, non-tender, non-distended MUSCULOSKELETAL:  No edema; No deformity  SKIN: Warm and dry NEUROLOGIC:  Alert and oriented x 3 PSYCHIATRIC:  Normal affect   ASSESSMENT:    1. Bradycardia   2. Bigeminy   3. OSA (obstructive sleep apnea)   4. BMI 40.0-44.9, adult [facility])    PLAN:    In order of problems listed above:  Bradycardia/Bigeminy - she was asymptomatic when this occurred, noted at her PCP office which precipitated a referral to cardiology. Zio monitor results are pending. CMP, Mag, TSH, CBC were previously unremarkable.  OSA - compliant with CPAP and 2 LPM O2  HS, wears 9 hours BMI 40 - She has lost  30 lbs recently, trying to watch what she eats, monitored by her PCP.     Disposition - will call and update of monitor results once received from the manufacturer. Return in 6 months.        Medication Adjustments/Labs and Tests Ordered: Current medicines are reviewed at length with the patient today.  Concerns regarding medicines are outlined above.  No orders of the defined types were placed in this encounter.  No orders of the defined types were placed in this encounter.   Patient Instructions  Medication Instructions:  Your Physician recommend you continue on your current medication as directed.    *If you need a refill on your cardiac medications before your next appointment, please call your pharmacy*  Follow-Up: At [facility], you and your health needs are our priority.  As part of our continuing mission to provide you with exceptional heart care, we have created designated Provider Care Teams.  These Care Teams include your primary Cardiologist (physician) and Advanced Practice Providers (APPs -  Physician Assistants and Nurse Practitioners) who all work together to provide you with the care you need, when you need it.  We recommend signing up for the patient portal called MyChart.  Sign up information is provided on this After Visit Summary.  MyChart is used to connect with patients for Virtual Visits (Telemedicine).  Patients are able to view lab/test results, encounter notes, upcoming appointments, etc.  Non-urgent messages can be sent to your provider as well.   To learn more about what you can do with MyChart, go to [email or web address].    Your next appointment:   6 month(s)  Provider:   Annabella Scarce, MD    Other Instructions Exercise recommendations: The American Heart Association recommends 150 minutes of moderate intensity exercise weekly. Try 30 minutes of moderate intensity exercise 4-5 times per week. This could include walking, jogging, or  swimming.  Heart Healthy Diet Recommendations: A low-salt diet is recommended. Meats should  be grilled, baked, or boiled. Avoid fried foods. Focus on lean protein sources like fish or chicken with vegetables and fruits. The American Heart Association is a Chief Technology Officer!  American Heart Association Diet and Lifeystyle Recommendations      Signed, Delon JAYSON Hoover, NP  03/03/2022 3:56 PM    [facility] "

## 2022-03-03 ENCOUNTER — Ambulatory Visit (HOSPITAL_BASED_OUTPATIENT_CLINIC_OR_DEPARTMENT_OTHER): Payer: Medicare HMO | Admitting: Cardiology

## 2022-03-03 ENCOUNTER — Encounter (HOSPITAL_BASED_OUTPATIENT_CLINIC_OR_DEPARTMENT_OTHER): Payer: Self-pay | Admitting: Cardiology

## 2022-03-03 VITALS — BP 120/70 | HR 71 | Ht 62.0 in | Wt 219.0 lb

## 2022-03-03 DIAGNOSIS — R001 Bradycardia, unspecified: Secondary | ICD-10-CM

## 2022-03-03 DIAGNOSIS — I498 Other specified cardiac arrhythmias: Secondary | ICD-10-CM

## 2022-03-03 DIAGNOSIS — G4733 Obstructive sleep apnea (adult) (pediatric): Secondary | ICD-10-CM

## 2022-03-03 DIAGNOSIS — Z6841 Body Mass Index (BMI) 40.0 and over, adult: Secondary | ICD-10-CM | POA: Diagnosis not present

## 2022-03-03 NOTE — Patient Instructions (Signed)
Medication Instructions:  Your Physician recommend you continue on your current medication as directed.    *If you need a refill on your cardiac medications before your next appointment, please call your pharmacy*  Follow-Up: At [facility], you and your health needs are our priority.  As part of our continuing mission to provide you with exceptional heart care, we have created designated Provider Care Teams.  These Care Teams include your primary Cardiologist (physician) and Advanced Practice Providers (APPs -  Physician Assistants and Nurse Practitioners) who all work together to provide you with the care you need, when you need it.  We recommend signing up for the patient portal called "MyChart".  Sign up information is provided on this After Visit Summary.  MyChart is used to connect with patients for Virtual Visits (Telemedicine).  Patients are able to view lab/test results, encounter notes, upcoming appointments, etc.  Non-urgent messages can be sent to your provider as well.   To learn more about what you can do with MyChart, go to [email or web address].    Your next appointment:   6 month(s)  Provider:   Skeet Latch, MD    Other Instructions Exercise recommendations: The American Heart Association recommends 150 minutes of moderate intensity exercise weekly. Try 30 minutes of moderate intensity exercise 4-5 times per week. This could include walking, jogging, or swimming.  Heart Healthy Diet Recommendations: A low-salt diet is recommended. Meats should be grilled, baked, or boiled. Avoid fried foods. Focus on lean protein sources like fish or chicken with vegetables and fruits. The American Heart Association is a Microbiologist!  American Heart Association Diet and Lifeystyle Recommendations

## 2022-03-06 DIAGNOSIS — R001 Bradycardia, unspecified: Secondary | ICD-10-CM | POA: Diagnosis not present

## 2022-03-07 DIAGNOSIS — E669 Obesity, unspecified: Secondary | ICD-10-CM | POA: Diagnosis not present

## 2022-03-07 DIAGNOSIS — M6281 Muscle weakness (generalized): Secondary | ICD-10-CM | POA: Diagnosis not present

## 2022-03-07 DIAGNOSIS — M5431 Sciatica, right side: Secondary | ICD-10-CM | POA: Diagnosis not present

## 2022-03-07 DIAGNOSIS — M799 Soft tissue disorder, unspecified: Secondary | ICD-10-CM | POA: Diagnosis not present

## 2022-03-07 DIAGNOSIS — R262 Difficulty in walking, not elsewhere classified: Secondary | ICD-10-CM | POA: Diagnosis not present

## 2022-03-24 ENCOUNTER — Telehealth (HOSPITAL_BASED_OUTPATIENT_CLINIC_OR_DEPARTMENT_OTHER): Payer: Self-pay

## 2022-03-24 NOTE — Telephone Encounter (Addendum)
Seen by patient Mary [name] on 03/21/2022 10:05 PM; follow up mychart message sent to patient.  ----- Message from Skeet Latch, MD sent at 03/21/2022  3:17 PM EST ----- Monitor showed several episodes of fast heart rates of the top and bottom chambers of the heart.  She has no bradycardia.  It is actually the extra heart beats that are making her heart rate difficult to calculate by the machine.  Given that she has no symptoms, no medications recommended.  If she has palpitations we can give metoprolol tartrate '25mg'$  bid.

## 2022-03-25 ENCOUNTER — Other Ambulatory Visit (HOSPITAL_COMMUNITY): Payer: Self-pay

## 2022-03-26 ENCOUNTER — Other Ambulatory Visit (HOSPITAL_COMMUNITY): Payer: Self-pay

## 2022-03-26 MED ORDER — METRONIDAZOLE 0.75 % EX CREA
TOPICAL_CREAM | CUTANEOUS | 2 refills | Status: DC
  Filled 2022-03-26: qty 45, 30d supply, fill #0
  Filled 2022-07-24: qty 45, 30d supply, fill #1

## 2022-04-28 ENCOUNTER — Other Ambulatory Visit (HOSPITAL_COMMUNITY): Payer: Self-pay

## 2022-04-29 ENCOUNTER — Other Ambulatory Visit (HOSPITAL_COMMUNITY): Payer: Self-pay

## 2022-04-29 MED ORDER — OZEMPIC (1 MG/DOSE) 4 MG/3ML ~~LOC~~ SOPN
1.0000 mg | PEN_INJECTOR | SUBCUTANEOUS | 1 refills | Status: DC
  Filled 2022-04-29: qty 3, 28d supply, fill #0
  Filled 2022-05-21: qty 3, 28d supply, fill #1
  Filled 2022-06-26 – 2022-06-27 (×2): qty 3, 28d supply, fill #2
  Filled 2022-07-24: qty 3, 28d supply, fill #3

## 2022-04-30 ENCOUNTER — Other Ambulatory Visit (HOSPITAL_COMMUNITY): Payer: Self-pay

## 2022-05-23 ENCOUNTER — Other Ambulatory Visit (HOSPITAL_COMMUNITY): Payer: Self-pay

## 2022-05-23 DIAGNOSIS — J0100 Acute maxillary sinusitis, unspecified: Secondary | ICD-10-CM | POA: Diagnosis not present

## 2022-05-23 MED ORDER — AMOXICILLIN-POT CLAVULANATE 875-125 MG PO TABS
1.0000 | ORAL_TABLET | Freq: Two times a day (BID) | ORAL | 0 refills | Status: DC
  Filled 2022-05-23: qty 14, 7d supply, fill #0

## 2022-06-09 ENCOUNTER — Other Ambulatory Visit: Payer: Self-pay | Admitting: Family Medicine

## 2022-06-09 ENCOUNTER — Other Ambulatory Visit (HOSPITAL_COMMUNITY): Payer: Self-pay

## 2022-06-09 DIAGNOSIS — Z0000 Encounter for general adult medical examination without abnormal findings: Secondary | ICD-10-CM | POA: Diagnosis not present

## 2022-06-09 DIAGNOSIS — G4734 Idiopathic sleep related nonobstructive alveolar hypoventilation: Secondary | ICD-10-CM | POA: Diagnosis not present

## 2022-06-09 DIAGNOSIS — M797 Fibromyalgia: Secondary | ICD-10-CM | POA: Diagnosis not present

## 2022-06-09 DIAGNOSIS — F325 Major depressive disorder, single episode, in full remission: Secondary | ICD-10-CM | POA: Diagnosis not present

## 2022-06-09 DIAGNOSIS — E119 Type 2 diabetes mellitus without complications: Secondary | ICD-10-CM | POA: Diagnosis not present

## 2022-06-09 DIAGNOSIS — E559 Vitamin D deficiency, unspecified: Secondary | ICD-10-CM | POA: Diagnosis not present

## 2022-06-09 DIAGNOSIS — M8580 Other specified disorders of bone density and structure, unspecified site: Secondary | ICD-10-CM | POA: Diagnosis not present

## 2022-06-09 DIAGNOSIS — E785 Hyperlipidemia, unspecified: Secondary | ICD-10-CM | POA: Diagnosis not present

## 2022-06-09 DIAGNOSIS — E1169 Type 2 diabetes mellitus with other specified complication: Secondary | ICD-10-CM | POA: Diagnosis not present

## 2022-06-09 DIAGNOSIS — M5136 Other intervertebral disc degeneration, lumbar region: Secondary | ICD-10-CM | POA: Diagnosis not present

## 2022-06-09 DIAGNOSIS — M7989 Other specified soft tissue disorders: Secondary | ICD-10-CM

## 2022-06-09 MED ORDER — AMOXICILLIN-POT CLAVULANATE 875-125 MG PO TABS
1.0000 | ORAL_TABLET | Freq: Two times a day (BID) | ORAL | 0 refills | Status: DC
  Filled 2022-06-09: qty 20, 10d supply, fill #0

## 2022-06-11 ENCOUNTER — Other Ambulatory Visit: Payer: Self-pay | Admitting: Family Medicine

## 2022-06-11 DIAGNOSIS — N6459 Other signs and symptoms in breast: Secondary | ICD-10-CM

## 2022-06-24 ENCOUNTER — Ambulatory Visit
Admission: RE | Admit: 2022-06-24 | Discharge: 2022-06-24 | Disposition: A | Discharge status: Home / Self Care | Payer: Medicare HMO | Source: Ambulatory Visit | Attending: Family Medicine | Admitting: Family Medicine

## 2022-06-24 DIAGNOSIS — N6459 Other signs and symptoms in breast: Secondary | ICD-10-CM

## 2022-06-25 ENCOUNTER — Other Ambulatory Visit (HOSPITAL_COMMUNITY): Payer: Self-pay

## 2022-06-25 DIAGNOSIS — R051 Acute cough: Secondary | ICD-10-CM | POA: Diagnosis not present

## 2022-06-25 DIAGNOSIS — Z03818 Encounter for observation for suspected exposure to other biological agents ruled out: Secondary | ICD-10-CM | POA: Diagnosis not present

## 2022-06-25 MED ORDER — AZITHROMYCIN 250 MG PO TABS
ORAL_TABLET | ORAL | 0 refills | Status: DC
  Filled 2022-06-25: qty 6, 5d supply, fill #0

## 2022-06-27 ENCOUNTER — Other Ambulatory Visit (HOSPITAL_COMMUNITY): Payer: Self-pay

## 2022-06-27 ENCOUNTER — Other Ambulatory Visit: Payer: Self-pay

## 2022-07-05 DIAGNOSIS — J018 Other acute sinusitis: Secondary | ICD-10-CM | POA: Diagnosis not present

## 2022-07-08 ENCOUNTER — Other Ambulatory Visit: Payer: Self-pay | Admitting: Family Medicine

## 2022-07-08 ENCOUNTER — Ambulatory Visit
Admission: RE | Admit: 2022-07-08 | Discharge: 2022-07-08 | Disposition: A | Discharge status: Home / Self Care | Payer: Medicare HMO | Source: Ambulatory Visit | Attending: Family Medicine | Admitting: Family Medicine

## 2022-07-08 DIAGNOSIS — M7989 Other specified soft tissue disorders: Secondary | ICD-10-CM

## 2022-07-08 DIAGNOSIS — R2231 Localized swelling, mass and lump, right upper limb: Secondary | ICD-10-CM | POA: Diagnosis not present

## 2022-07-23 ENCOUNTER — Encounter: Payer: Self-pay | Admitting: Family Medicine

## 2022-07-23 ENCOUNTER — Other Ambulatory Visit: Payer: Self-pay | Admitting: Family Medicine

## 2022-07-23 DIAGNOSIS — M7989 Other specified soft tissue disorders: Secondary | ICD-10-CM

## 2022-07-25 ENCOUNTER — Other Ambulatory Visit (HOSPITAL_COMMUNITY): Payer: Self-pay

## 2022-08-06 ENCOUNTER — Ambulatory Visit
Admission: RE | Admit: 2022-08-06 | Discharge: 2022-08-06 | Disposition: A | Discharge status: Home / Self Care | Payer: Medicare HMO | Source: Ambulatory Visit | Attending: Family Medicine | Admitting: Family Medicine

## 2022-08-06 DIAGNOSIS — M7989 Other specified soft tissue disorders: Secondary | ICD-10-CM

## 2022-08-06 DIAGNOSIS — R2231 Localized swelling, mass and lump, right upper limb: Secondary | ICD-10-CM | POA: Diagnosis not present

## 2022-08-06 MED ORDER — GADOPICLENOL 0.5 MMOL/ML IV SOLN
7.5000 mL | Freq: Once | INTRAVENOUS | Status: AC | PRN
  Administered 2022-08-06: 7.5 mL via INTRAVENOUS

## 2022-08-20 ENCOUNTER — Other Ambulatory Visit (HOSPITAL_COMMUNITY): Payer: Self-pay

## 2022-08-21 ENCOUNTER — Ambulatory Visit (INDEPENDENT_AMBULATORY_CARE_PROVIDER_SITE_OTHER): Payer: Medicare HMO

## 2022-08-21 ENCOUNTER — Encounter: Payer: Self-pay | Admitting: Podiatry

## 2022-08-21 ENCOUNTER — Ambulatory Visit: Payer: Medicare HMO | Admitting: Podiatry

## 2022-08-21 ENCOUNTER — Other Ambulatory Visit (HOSPITAL_COMMUNITY): Payer: Self-pay

## 2022-08-21 DIAGNOSIS — K635 Polyp of colon: Secondary | ICD-10-CM | POA: Insufficient documentation

## 2022-08-21 DIAGNOSIS — M2041 Other hammer toe(s) (acquired), right foot: Secondary | ICD-10-CM | POA: Diagnosis not present

## 2022-08-21 DIAGNOSIS — Z800 Family history of malignant neoplasm of digestive organs: Secondary | ICD-10-CM | POA: Insufficient documentation

## 2022-08-21 DIAGNOSIS — M2011 Hallux valgus (acquired), right foot: Secondary | ICD-10-CM

## 2022-08-21 DIAGNOSIS — Z860100 Personal history of colon polyps, unspecified: Secondary | ICD-10-CM | POA: Insufficient documentation

## 2022-08-21 DIAGNOSIS — K5730 Diverticulosis of large intestine without perforation or abscess without bleeding: Secondary | ICD-10-CM | POA: Insufficient documentation

## 2022-08-21 DIAGNOSIS — K625 Hemorrhage of anus and rectum: Secondary | ICD-10-CM | POA: Insufficient documentation

## 2022-08-21 DIAGNOSIS — K219 Gastro-esophageal reflux disease without esophagitis: Secondary | ICD-10-CM | POA: Insufficient documentation

## 2022-08-21 DIAGNOSIS — K900 Celiac disease: Secondary | ICD-10-CM | POA: Insufficient documentation

## 2022-08-21 DIAGNOSIS — Z1211 Encounter for screening for malignant neoplasm of colon: Secondary | ICD-10-CM | POA: Insufficient documentation

## 2022-08-21 DIAGNOSIS — K52832 Lymphocytic colitis: Secondary | ICD-10-CM | POA: Insufficient documentation

## 2022-08-21 MED ORDER — OZEMPIC (1 MG/DOSE) 4 MG/3ML ~~LOC~~ SOPN
1.0000 mg | PEN_INJECTOR | SUBCUTANEOUS | 1 refills | Status: DC
  Filled 2022-08-21: qty 3, 28d supply, fill #0
  Filled 2022-09-20: qty 3, 28d supply, fill #1
  Filled 2022-10-27: qty 3, 28d supply, fill #2
  Filled 2022-11-20: qty 3, 28d supply, fill #3

## 2022-08-21 NOTE — Progress Notes (Signed)
Subjective:  Patient ID: Mary [name], female    DOB: February 15, 1955,  MRN: [id number] HPI Chief Complaint  Patient presents with   Foot Pain    Plantar forefoot/2nd toe right - toe overlaps big toe x years, starting to get some pain underneath the toe/forefoot, certain shoes get uncomfortable and rub, tried toe separator   New Patient (Initial Visit)    67 y.o. female presents with the above complaint.   ROS:  Denies fever chills nausea vomit muscle aches pains calf pain back pain chest pain shortness of breath.  She has tried anti-inflammatories shoe gear changes physical therapy to help alleviate her symptoms and to get these toes to sit down as she refers to the second digit all to no avail.  Past Medical History:  Diagnosis Date   Anaphylaxis    Chicory root, shellfish   Arthritis    hands, knees, neck - no meds   Bradycardia 01/31/2022   Celiac disease    Depression    Fibromyalgia    GERD (gastroesophageal reflux disease)    no meds   Morbid obesity (HCC) 10/19/2015   PONV (postoperative nausea and vomiting)    SVD (spontaneous vaginal delivery)    x 2   Systolic murmur 10/19/2015   Past Surgical History:  Procedure Laterality Date   BILATERAL SALPINGECTOMY Bilateral 03/30/2013   Procedure: BILATERAL SALPINGECTOMY;  Surgeon: Geryl Rankins, MD;  Location: [facility];  Service: Gynecology;  Laterality: Bilateral;   CARDIAC CATHETERIZATION N/A 12/07/2015   Procedure: Left Heart Cath and Coronary Angiography;  Surgeon: Marykay Lex, MD;  Location: [facility] INVASIVE CV LAB;  Service: Cardiovascular;  Laterality: N/A;   COLONOSCOPY WITH PROPOFOL N/A 02/17/2020   Procedure: COLONOSCOPY WITH PROPOFOL;  Surgeon: Jeani Hawking, MD;  Location: [facility];  Service: Endoscopy;  Laterality: N/A;   dermoid cystect     HYSTEROSCOPY WITH D & C  01/28/2012   Procedure: DILATATION AND CURETTAGE /HYSTEROSCOPY;  Surgeon: Geryl Rankins, MD;  Location: [facility];  Service: Gynecology;  Laterality:  N/A;   LAPAROSCOPIC ASSISTED VAGINAL HYSTERECTOMY N/A 03/30/2013   Procedure: LAPAROSCOPIC ASSISTED VAGINAL HYSTERECTOMY;  Surgeon: Geryl Rankins, MD;  Location: [facility];  Service: Gynecology;  Laterality: N/A;   POLYPECTOMY  02/17/2020   Procedure: POLYPECTOMY;  Surgeon: Jeani Hawking, MD;  Location: [facility];  Service: Endoscopy;;   REPLACEMENT TOTAL KNEE     bilateral   SPINAL FUSION     C3-C4   WISDOM TOOTH EXTRACTION      Current Outpatient Medications:    acetaminophen (TYLENOL) 500 MG tablet, Take 1,000 mg by mouth daily as needed for headache., Disp: , Rfl:    amoxicillin (AMOXIL) 500 MG capsule, Take 4 capsules (2,000 mg total) by mouth 1 hour prior to dental appointment., Disp: 8 capsule, Rfl: 99   amoxicillin-clavulanate (AUGMENTIN) 875-125 MG tablet, Take 1 tablet by mouth every 12 (twelve) hours for 10 days, Disp: 20 tablet, Rfl: 0   Ascorbic Acid (VITAMIN C PO), Take 1 tablet by mouth daily., Disp: , Rfl:    atorvastatin (LIPITOR) 20 MG tablet, Take 20 mg by mouth daily., Disp: , Rfl:    azithromycin (ZITHROMAX Z-PAK) 250 MG tablet, Take 2 tablets on day 1 then take 1 tablet daily for 4 days, Disp: 6 tablet, Rfl: 0   Cholecalciferol (VITAMIN D3 PO), Take 1 capsule by mouth daily., Disp: , Rfl:    Cyanocobalamin (B-12 PO), Take 1 capsule by mouth daily., Disp: , Rfl:    diclofenac  sodium (VOLTAREN) 1 % GEL, Apply 2 g topically daily as needed (for pain)., Disp: , Rfl:    diphenhydrAMINE (BENADRYL) 25 MG tablet, Take 25 mg by mouth 2 (two) times daily as needed for sleep, allergies or itching., Disp: , Rfl:    DULoxetine (CYMBALTA) 30 MG capsule, Take 3 capsules (90 mg total) by mouth daily., Disp: 270 capsule, Rfl: 1   EPINEPHrine 0.3 mg/0.3 mL IJ SOAJ injection, Inject 0.3 mg into the muscle as needed for anaphylaxis., Disp: 2 each, Rfl: 1   glucose blood (FREESTYLE LITE) test strip, Use to test your blood sugar daily, Disp: 100 strip, Rfl: 3   Lancets (FREESTYLE) lancets,  Use to check blood sugar daily, Disp: 100 each, Rfl: 3   metFORMIN (GLUCOPHAGE-XR) 500 MG 24 hr tablet, Take 1,000 mg by mouth daily with breakfast., Disp: , Rfl:    metroNIDAZOLE (METROCREAM) 0.75 % cream, Apply a small amount to skin twice a day., Disp: 45 g, Rfl: 2   Naltrexone HCl, Pain, 4.5 MG CAPS, Take 1 capsule by mouth daily at 12 noon., Disp: , Rfl:    Omega-3 Fatty Acids (FISH OIL PO), Take 2 capsules by mouth daily., Disp: , Rfl:    Semaglutide, 1 MG/DOSE, (OZEMPIC, 1 MG/DOSE,) 4 MG/3ML SOPN, Inject 1 mg into the skin once a week., Disp: 6 mL, Rfl: 1  Allergies  Allergen Reactions   Aspirin Anaphylaxis and Swelling   Chicory Root [Cichorium Intybus] Anaphylaxis, Shortness Of Breath, Itching and Swelling    Swelling of tongue and throat. Scratch throat. cough   Nsaids Anaphylaxis and Swelling   Shellfish Allergy Anaphylaxis and Swelling   Celebrex [Celecoxib] Swelling    Facial swelling   Review of Systems Objective:  There were no vitals filed for this visit.  General: Well developed, nourished, in no acute distress, alert and oriented x3   Dermatological: Skin is warm, dry and supple bilateral. Nails x 10 are well maintained; remaining integument appears unremarkable at this time. There are no open sores, no preulcerative lesions, no rash or signs of infection present.  Vascular: Dorsalis Pedis artery and Posterior Tibial artery pedal pulses are 2/4 bilateral with immedate capillary fill time. Pedal hair growth present. No varicosities and no lower extremity edema present bilateral.   Neruologic: Grossly intact via light touch bilateral. Vibratory intact via tuning fork bilateral. Protective threshold with Semmes Wienstein monofilament intact to all pedal sites bilateral. Patellar and Achilles deep tendon reflexes 2+ bilateral. No Babinski or clonus noted bilateral.   Musculoskeletal: No gross boney pedal deformities bilateral. No pain, crepitus, or limitation noted with  foot and ankle range of motion bilateral. Muscular strength 5/5 in all groups tested bilateral.  Very mild hallux abductovalgus deformity of her bilateral feet she does have pain on palpation of the second metatarsal phalangeal joint bilaterally with a cocked up hammertoe deformity of the second toe which is somewhat flexible on her right foot but her left foot is more rigid.  She also has flexible contracture deformity of toe number 2 3 and 4 and 5.  Gait: Unassisted, Nonantalgic.    Radiographs:  Radiographs taken today demonstrate an osseously mature individual with good mineralization of the bone.  She has mild hallux valgus deformity with and elongated second metatarsal most likely the culprit of her digital deformity.  She does have flexible hammertoe deformities on the right foot appear to be more rigid on the left foot with some osteoarthritic changes.  She has mild edema on lateral  view overlying the second metatarsal phalangeal joint area.  Assessment & Plan:   Assessment: Hallux valgus deformity right greater than left she also has capsulitis plantarflexed elongated second metatarsal hammertoe deformity second right.  Plan: Discussed etiology pathology conservative versus surgical therapies consented her for surgery today considering her conservative therapies have failed.  We will perform a McBride bunion repair and loosen its lateral structures.  This should give [place] enough room to shorten the second metatarsal and dropped the toe down.  We are also going to perform a flexor tenotomy to toe #234 and 5.  We did discuss the possible postop complications which may include but are not limited to postop pain bleeding swell infection recurrence need for further surgery overcorrection under correction loss of digit loss limb loss of life.  We did discuss the surgery center and anesthesia group dispensed all information regarding this and we will follow-up with her in the near future.     Marilou Barnfield  T. [place], [place]

## 2022-08-26 ENCOUNTER — Ambulatory Visit (HOSPITAL_BASED_OUTPATIENT_CLINIC_OR_DEPARTMENT_OTHER): Payer: Medicare HMO | Admitting: Cardiovascular Disease

## 2022-08-26 ENCOUNTER — Encounter (HOSPITAL_BASED_OUTPATIENT_CLINIC_OR_DEPARTMENT_OTHER): Payer: Self-pay | Admitting: Cardiovascular Disease

## 2022-08-26 ENCOUNTER — Telehealth (HOSPITAL_COMMUNITY): Payer: Self-pay | Admitting: Radiology

## 2022-08-26 VITALS — BP 116/76 | HR 68 | Ht 62.0 in | Wt 219.0 lb

## 2022-08-26 DIAGNOSIS — I4729 Other ventricular tachycardia: Secondary | ICD-10-CM | POA: Insufficient documentation

## 2022-08-26 DIAGNOSIS — I471 Supraventricular tachycardia, unspecified: Secondary | ICD-10-CM | POA: Diagnosis not present

## 2022-08-26 DIAGNOSIS — E7800 Pure hypercholesterolemia, unspecified: Secondary | ICD-10-CM | POA: Diagnosis not present

## 2022-08-26 DIAGNOSIS — G4733 Obstructive sleep apnea (adult) (pediatric): Secondary | ICD-10-CM

## 2022-08-26 HISTORY — DX: Other ventricular tachycardia: I47.29

## 2022-08-26 HISTORY — DX: Supraventricular tachycardia, unspecified: I47.10

## 2022-08-26 NOTE — Patient Instructions (Signed)
Medication Instructions:  Your physician recommends that you continue on your current medications as directed. Please refer to the Current Medication list given to you today.   *If you need a refill on your cardiac medications before your next appointment, please call your pharmacy*  Lab Work: NONE  Testing/Procedures: Your physician has requested that you have an echocardiogram. Echocardiography is a painless test that uses sound waves to create images of your heart. It provides your doctor with information about the size and shape of your heart and how well your heart's chambers and valves are working. This procedure takes approximately one hour. There are no restrictions for this procedure. Please do NOT wear cologne, perfume, aftershave, or lotions (deodorant is allowed). Please arrive 15 minutes prior to your appointment time.  Your physician has requested that you have a lexiscan myoview. For further information please visit [email or web address] Please follow instruction sheet, as given.  Follow-Up: At [facility], you and your health needs are our priority.  As part of our continuing mission to provide you with exceptional heart care, we have created designated Provider Care Teams.  These Care Teams include your primary Cardiologist (physician) and Advanced Practice Providers (APPs -  Physician Assistants and Nurse Practitioners) who all work together to provide you with the care you need, when you need it.  We recommend signing up for the patient portal called "MyChart".  Sign up information is provided on this After Visit Summary.  MyChart is used to connect with patients for Virtual Visits (Telemedicine).  Patients are able to view lab/test results, encounter notes, upcoming appointments, etc.  Non-urgent messages can be sent to your provider as well.   To learn more about what you can do with MyChart, go to [email or web address].    Your next appointment:   3  month(s)  Provider:   Gillian Shields, NP    DR [facility] IN 1 YEAR  Other Instructions  Your Physician has requested you have a Myocardial Perfusion Imaging Study.   Please arrive 15 minutes prior to your appointment time for registration and insurance purposes.   The test will take approximately 3 to 4 hours to complete; you may bring reading material. If someone comes with you to your appointment, they will need to remain in the main lobby due to limited testing space in the testing area. **If you are pregnant or breastfeeding, please notify the nuclear lab prior to your appointment**   How to prepare for your test:  - Do not eat or drink 3 hours prior to your test, except you may have water  - Do not consume products containing caffeine ( regular or decaf) 12 hours prior to your test ( coffee, chocolate, sodas or teas)  - Do bring a list of your current medications with you. If not listed below, you may take your medications as normal (Hold beta blocker- 24 hour prior for exercise myoview)   - Do wear comfortable clothes (no dresses or overalls) and walking shoes ( tennis shoes preferred), no heel or open toe shoes are allowed - Do not wear cologne, perfume, aftershave, or lotions ( deodorant is allowed)  - If these instructions are not followed, your test will be rescheduled   If you cannot keep your appointment, please provide 24 hours notice to the Nuclear Lab, to avoid a possible $50 charge to your account!

## 2022-08-26 NOTE — Telephone Encounter (Signed)
Patient given detailed instructions per Myocardial Perfusion Study Information Sheet for the test on 09/01/2022 at 13:00. Patient notified to arrive 15 minutes early and that it is imperative to arrive on time for appointment to keep from having the test rescheduled.  If you need to cancel or reschedule your appointment, please call the office within 24 hours of your appointment. . Patient verbalized understanding.EHK

## 2022-08-26 NOTE — Progress Notes (Signed)
Cardiology Office Note:  .    Date:  08/26/2022  ID:  Mary [name], DOB 1956-01-11, MRN [id number] PCP: Laurann Montana, MD   [facility] Providers Cardiologist:  Chilton Si, MD     History of Present Illness: .    Mary [name] is a 67 y.o. female with a hx of hyperlipidemia, morbid obesity, and OSA. She was initially seen in 2017 for jaw pain. ETT was non-diagnostic. She was referred for an ETT 11/06/15 that was nondiagnostic due to baseline EKG changes. She was then referred for an exercise Myoview that revealed LVEF 68% with a small apical septal defect concerning for ischemia. She reported a remote cath that was negative. She had a repeat cath 11/2015 that showed normal LVEF and no CAD. She saw her PCP 11/2021 and reported increasing sleepiness. She was compliant with her CPAP. She was noted to be bradycardic and was referred to cardiology.   At her visit 01/2022, she confirmed her heart rate was in the 40's at her PCP visit and her EKG had shown multiple PVC's. At home her pulse had been as low as 35-40 bpm with highs of 120 bpm. She complained of frequent dizziness and lightheadedness, and occasional mild chest pain. Orthostatic vital signs were unremarkable. She wore a Zio monitor revealing several runs of SVT and NSVT lasting up to 17 beats. She also had frequent PVC's (5.6%). No bradycardia. Given that she has no symptoms, no medications recommended. Will plan for metoprolol tartrate 25 mg BID if she reports palpitations. She followed up with Wallis Bamberg, NP 02/2022 but her monitor results were not finalized at the time. She was feeling better with occasional dizziness that she thought was due to dehydration. On Ozempic she had lost 30 lbs and reported resolution of her pedal edema. Was also working with PT to help with mobility.  Today, she states she is feeling fine overall. Several times recently she has been aware of fluttering palpitations when lying down at bedtime, but  mostly this has been infrequent and not every night. The episodes have typically been brief and she is able to return to sleep. We reviewed her monitor results together. In the office her blood pressure is 116/76. For exercise she has been going to the pool, assisting with her grandchildren, and staying active around the house. Notes that she isn't completing as much cardio lately. In May she went on a trip to [place], had recurring sinus infection prior to that improved with prednisone. She continues to use her CPAP every night; if she wakes up during the night she may take the mask off after 4-5 hours. She denies any chest pain, shortness of breath, peripheral edema, lightheadedness, headaches, syncope, orthopnea, or PND.  ROS:  Please see the history of present illness. All other systems are reviewed and negative.  (+) Infrequent nocturnal palpitations  Studies Reviewed: .        12 Day Zio Monitor  02/2022: Quality: Fair.  Baseline artifact. Predominant rhythm: sinus rhythm Average heart rate: 74 bpm Max heart rate: 124 bpm Min heart rate: 49 bpm Pauses >2.5 seconds: none   14 beat run of NSVT 24 episodes of SVT.  Maximum rate 146 bpm.  Longest duration 17 beats. Occasional PACs (3.2%)  Frequent PVCs (5.6%)  Risk Assessment/Calculations:             Physical Exam:    VS:  BP 116/76 (BP Location: Left Arm, Patient Position: Sitting, Cuff Size: Large)  Pulse 68   Ht 5\' 2"  (1.575 m)   Wt 219 lb (99.3 kg)   SpO2 95%   BMI 40.06 kg/m  , BMI Body mass index is 40.06 kg/m. GENERAL:  Well appearing HEENT: Pupils equal round and reactive, fundi not visualized, oral mucosa unremarkable NECK:  No jugular venous distention, waveform within normal limits, carotid upstroke brisk and symmetric, no bruits, no thyromegaly LYMPHATICS:  No cervical adenopathy LUNGS:  Clear to auscultation bilaterally HEART:  RRR.  PMI not displaced or sustained,S1 and S2 within normal limits, no S3, no S4, no  clicks, no rubs, no murmurs ABD:  Flat, positive bowel sounds normal in frequency in pitch, no bruits, no rebound, no guarding, no midline pulsatile mass, no hepatomegaly, no splenomegaly EXT:  2 plus pulses throughout, no edema, no cyanosis no clubbing SKIN:  No rashes no nodules NEURO:  Cranial nerves II through XII grossly intact, motor grossly intact throughout PSYCH:  Cognitively intact, oriented to person place and time  Wt Readings from Last 3 Encounters:  08/26/22 219 lb (99.3 kg)  03/03/22 219 lb (99.3 kg)  01/31/22 221 lb 14.4 oz (100.7 kg)     ASSESSMENT AND PLAN: .    # SVT:  #NSVT:  Intermittent sensation of heart fluttering, particularly at night. Previous monitor showed runs of supraventricular tachycardia (SVT) and nonsustained ventricular tachycardia (NSVT). No associated chest pain or shortness of breath. Patient is not significantly bothered by the symptoms and prefers not to start medication at this time. -Order echocardiogram to assess cardiac structure and function. -Order exercise Myoview test to evaluate for any underlying ischemia. -Plan to follow up in a few months or sooner if test results are abnormal.  # Sleep Apnea Patient uses CPAP machine regularly, though occasionally removes it during the night. -Continue CPAP use as directed. -Encourage patient to discuss any issues with CPAP use at upcoming pulmonary appointment.   # Hyperlipidemia: Continue atorvastatin.  # Morbid obesity:  Doing well with weight loss. Encouraged exercise.  Continue Ozempic.   General Health Maintenance -Encourage patient to continue regular exercise, including pool exercises. -Encourage patient to maintain good hydration and balanced diet. -Plan for annual follow-up visit.           Informed Consent   Shared Decision Making/Informed Consent The risks [chest pain, shortness of breath, cardiac arrhythmias, dizziness, blood pressure fluctuations, myocardial infarction,  stroke/transient ischemic attack, nausea, vomiting, allergic reaction, radiation exposure, metallic taste sensation and life-threatening complications (estimated to be 1 in 10,000)], benefits (risk stratification, diagnosing coronary artery disease, treatment guidance) and alternatives of a nuclear stress test were discussed in detail with Mary [name] and she agrees to proceed.     Dispo:  FU with APP in 2 months. FU with Gissell Barra C. Duke Salvia, MD, [facility] in 1 year  I,Mathew Stumpf,acting as a scribe for Chilton Si, MD.,have documented all relevant documentation on the behalf of Chilton Si, MD,as directed by  Chilton Si, MD while in the presence of Chilton Si, MD.  I, Ellinor Test C. Duke Salvia, MD have reviewed all documentation for this visit.  The documentation of the exam, diagnosis, procedures, and orders on 08/26/2022 are all accurate and complete.   Signed, Chilton Si, MD

## 2022-08-28 ENCOUNTER — Telehealth (HOSPITAL_COMMUNITY): Payer: Self-pay | Admitting: *Deleted

## 2022-08-28 NOTE — Telephone Encounter (Signed)
Patient given detailed instructions per Myocardial Perfusion Study Information Sheet for the test on 09/01/2022 at 1:00. Patient notified to arrive 15 minutes early and that it is imperative to arrive on time for appointment to keep from having the test rescheduled.  If you need to cancel or reschedule your appointment, please call the office within 24 hours of your appointment. . Patient verbalized understanding.Mary [name]

## 2022-09-01 ENCOUNTER — Encounter (HOSPITAL_COMMUNITY): Payer: Medicare HMO

## 2022-09-01 ENCOUNTER — Ambulatory Visit (HOSPITAL_COMMUNITY): Payer: Medicare HMO | Attending: Internal Medicine

## 2022-09-01 DIAGNOSIS — I4729 Other ventricular tachycardia: Secondary | ICD-10-CM | POA: Insufficient documentation

## 2022-09-01 LAB — MYOCARDIAL PERFUSION IMAGING
LV dias vol: 85 mL (ref 46–106)
LV sys vol: 39 mL
Nuc Stress EF: 54 %
Peak HR: 90 {beats}/min
Rest HR: 61 {beats}/min
Rest Nuclear Isotope Dose: 9.9 mCi
SDS: 5
SRS: 3
SSS: 8
ST Depression (mm): 0 mm
Stress Nuclear Isotope Dose: 31.2 mCi
TID: 1.11

## 2022-09-01 MED ORDER — REGADENOSON 0.4 MG/5ML IV SOLN
0.4000 mg | Freq: Once | INTRAVENOUS | Status: AC
Start: 2022-09-01 — End: 2022-09-01
  Administered 2022-09-01: 0.4 mg via INTRAVENOUS

## 2022-09-01 MED ORDER — TECHNETIUM TC 99M TETROFOSMIN IV KIT
9.9000 | PACK | Freq: Once | INTRAVENOUS | Status: AC | PRN
  Administered 2022-09-01: 9.9 via INTRAVENOUS

## 2022-09-01 MED ORDER — TECHNETIUM TC 99M TETROFOSMIN IV KIT
31.2000 | PACK | Freq: Once | INTRAVENOUS | Status: AC | PRN
  Administered 2022-09-01: 31.2 via INTRAVENOUS

## 2022-09-16 ENCOUNTER — Other Ambulatory Visit (HOSPITAL_BASED_OUTPATIENT_CLINIC_OR_DEPARTMENT_OTHER): Payer: Medicare HMO

## 2022-09-19 ENCOUNTER — Other Ambulatory Visit: Payer: Self-pay | Admitting: Oncology

## 2022-09-19 DIAGNOSIS — Z006 Encounter for examination for normal comparison and control in clinical research program: Secondary | ICD-10-CM

## 2022-09-20 ENCOUNTER — Other Ambulatory Visit (HOSPITAL_COMMUNITY): Payer: Self-pay

## 2022-09-25 ENCOUNTER — Ambulatory Visit (INDEPENDENT_AMBULATORY_CARE_PROVIDER_SITE_OTHER): Payer: Medicare HMO

## 2022-09-25 DIAGNOSIS — I4729 Other ventricular tachycardia: Secondary | ICD-10-CM | POA: Diagnosis not present

## 2022-09-25 DIAGNOSIS — I358 Other nonrheumatic aortic valve disorders: Secondary | ICD-10-CM

## 2022-09-26 LAB — ECHOCARDIOGRAM COMPLETE
Area-P 1/2: 3.03 cm2
S' Lateral: 2.59 cm

## 2022-10-06 ENCOUNTER — Other Ambulatory Visit (HOSPITAL_COMMUNITY): Payer: Self-pay

## 2022-10-06 DIAGNOSIS — U071 COVID-19: Secondary | ICD-10-CM | POA: Diagnosis not present

## 2022-10-06 MED ORDER — PAXLOVID (300/100) 20 X 150 MG & 10 X 100MG PO TBPK
3.0000 | ORAL_TABLET | Freq: Two times a day (BID) | ORAL | 0 refills | Status: AC
  Filled 2022-10-06: qty 30, 5d supply, fill #0

## 2022-10-28 ENCOUNTER — Other Ambulatory Visit (HOSPITAL_COMMUNITY): Payer: Self-pay

## 2022-10-28 ENCOUNTER — Other Ambulatory Visit: Payer: Self-pay

## 2022-11-06 ENCOUNTER — Encounter: Payer: Medicare HMO | Admitting: Podiatry

## 2022-11-20 ENCOUNTER — Encounter: Payer: Medicare HMO | Admitting: Podiatry

## 2022-11-21 ENCOUNTER — Other Ambulatory Visit (HOSPITAL_COMMUNITY): Payer: Self-pay

## 2022-11-22 ENCOUNTER — Other Ambulatory Visit (HOSPITAL_COMMUNITY): Payer: Self-pay

## 2022-11-24 DIAGNOSIS — M79642 Pain in left hand: Secondary | ICD-10-CM | POA: Diagnosis not present

## 2022-11-24 DIAGNOSIS — M19041 Primary osteoarthritis, right hand: Secondary | ICD-10-CM | POA: Diagnosis not present

## 2022-11-24 DIAGNOSIS — M180 Bilateral primary osteoarthritis of first carpometacarpal joints: Secondary | ICD-10-CM | POA: Diagnosis not present

## 2022-11-25 ENCOUNTER — Ambulatory Visit: Payer: Medicare HMO | Admitting: Podiatry

## 2022-11-25 ENCOUNTER — Encounter: Payer: Self-pay | Admitting: Podiatry

## 2022-11-25 DIAGNOSIS — E785 Hyperlipidemia, unspecified: Secondary | ICD-10-CM | POA: Insufficient documentation

## 2022-11-25 DIAGNOSIS — M2011 Hallux valgus (acquired), right foot: Secondary | ICD-10-CM | POA: Diagnosis not present

## 2022-11-25 DIAGNOSIS — M51369 Other intervertebral disc degeneration, lumbar region without mention of lumbar back pain or lower extremity pain: Secondary | ICD-10-CM | POA: Insufficient documentation

## 2022-11-25 DIAGNOSIS — M2041 Other hammer toe(s) (acquired), right foot: Secondary | ICD-10-CM | POA: Diagnosis not present

## 2022-11-25 DIAGNOSIS — M797 Fibromyalgia: Secondary | ICD-10-CM | POA: Insufficient documentation

## 2022-11-25 DIAGNOSIS — E559 Vitamin D deficiency, unspecified: Secondary | ICD-10-CM | POA: Insufficient documentation

## 2022-11-25 DIAGNOSIS — F325 Major depressive disorder, single episode, in full remission: Secondary | ICD-10-CM | POA: Insufficient documentation

## 2022-11-25 DIAGNOSIS — Z91018 Allergy to other foods: Secondary | ICD-10-CM | POA: Insufficient documentation

## 2022-11-25 DIAGNOSIS — M8580 Other specified disorders of bone density and structure, unspecified site: Secondary | ICD-10-CM | POA: Insufficient documentation

## 2022-11-25 DIAGNOSIS — G4734 Idiopathic sleep related nonobstructive alveolar hypoventilation: Secondary | ICD-10-CM | POA: Insufficient documentation

## 2022-11-25 DIAGNOSIS — E1169 Type 2 diabetes mellitus with other specified complication: Secondary | ICD-10-CM | POA: Insufficient documentation

## 2022-11-25 NOTE — Progress Notes (Addendum)
She presents today for follow-up of her bunion deformity and her painful hammertoe deformity with capsulitis of the right foot.  States that she is having so much more pain and she was happy she was going to have her surgery by this time.  She states that this is been going on for many years it is painful to ambulate daily she states that she dreads getting up and putting shoes on in the morning.  She has worn braces and spacers prescribed by physicians and over-the-counter spacers as well.  She has also had orthotics in the past all to no avail.  She has to augment her shoe gear by stretching or cutting out the sides of the shoes she is also wearing pads and silicone sleeves when not in open toed shoes.  Due to the pain that she experiences on a daily basis she has used steroidal anti-inflammatories but is allergic to nonsteroidal anti-inflammatories.  She states that the majority of her pain is located over the prominence as she points to the first metatarsophalangeal joint the dorsal aspect of the cocked up hammertoe as she points to the PIPJ and underneath as she grabs the ball of her foot.  Objective: Vital signs are stable she is alert and oriented x 3.  Pulses are strongly palpable right foot.  Worsening of her bunion deformity since I saw her last in August exquisitely tender moderately erythematous there is some soft tissue swelling the hammertoe deformity is becoming more rigid and more medially dislocated from previous evaluation.  She has severe pain on palpation of the second metatarsal phalangeal joint and is minimally reducible at this point.  The first metatarsal phalangeal joint is minimally reducible and is angular deformity.  There is a small amount of crepitation with attempted range of motion in a corrected position of the first metatarsal phalangeal joint.  The remainder of the toes #3 4 and 5 are flexible but demonstrating hyperkeratotic tissue to the distal end of the toes.  Radiographs  taken demonstrate osseously mature individual with good bone mineralization elongated second metatarsal with an increase in the first intermetatarsal angle greater than normal value. Hallux abductus angle greater than normal value.  Hallux abductus angle is 9 degrees and intermetatarsal angle is 10 degrees. Rigid appearing hammertoe deformities there is some early osteoarthritic changes of the left foot more so than that of the right. 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  Early hammertoe deformity with dislocation of the second metatarsophalangeal joint and rigid digital change.  Retains flexible hammertoe deformities to toes #3 for follow-up of the right foot.  Plan: Discussed etiology pathology conservative versus surgical therapies at this point we reviewed me consented her today she will continue to utilize appropriate shoe gear stretching exercise ice therapy sugar modifications padding braces orthotics until time of surgery.  We went over the consent form once again today for surgery and we will resubmit this to her insurance company so that we will be able to have this covered.

## 2022-11-28 ENCOUNTER — Telehealth: Payer: Self-pay

## 2022-11-28 ENCOUNTER — Encounter (HOSPITAL_BASED_OUTPATIENT_CLINIC_OR_DEPARTMENT_OTHER): Payer: Self-pay | Admitting: Family

## 2022-11-28 ENCOUNTER — Ambulatory Visit (HOSPITAL_BASED_OUTPATIENT_CLINIC_OR_DEPARTMENT_OTHER): Payer: Medicare HMO | Admitting: Family

## 2022-11-28 VITALS — BP 104/66 | HR 67 | Ht 62.0 in | Wt 227.8 lb

## 2022-11-28 DIAGNOSIS — E782 Mixed hyperlipidemia: Secondary | ICD-10-CM | POA: Diagnosis not present

## 2022-11-28 DIAGNOSIS — I471 Supraventricular tachycardia, unspecified: Secondary | ICD-10-CM | POA: Diagnosis not present

## 2022-11-28 DIAGNOSIS — G4733 Obstructive sleep apnea (adult) (pediatric): Secondary | ICD-10-CM

## 2022-11-28 DIAGNOSIS — I4729 Other ventricular tachycardia: Secondary | ICD-10-CM | POA: Diagnosis not present

## 2022-11-28 NOTE — Patient Instructions (Addendum)
Medication Instructions:  Your physician recommends that you continue on your current medications as directed. Please refer to the Current Medication list given to you today.  *If you need a refill on your cardiac medications before your next appointment, please call your pharmacy*   Follow-Up: At [facility], you and your health needs are our priority.  As part of our continuing mission to provide you with exceptional heart care, we have created designated Provider Care Teams.  These Care Teams include your primary Cardiologist (physician) and Advanced Practice Providers (APPs -  Physician Assistants and Nurse Practitioners) who all work together to provide you with the care you need, when you need it.  We recommend signing up for the patient portal called "MyChart".  Sign up information is provided on this After Visit Summary.  MyChart is used to connect with patients for Virtual Visits (Telemedicine).  Patients are able to view lab/test results, encounter notes, upcoming appointments, etc.  Non-urgent messages can be sent to your provider as well.   To learn more about what you can do with MyChart, go to [email or web address].    Your next appointment:   August 2025  Provider:   Gillian Shields, NP    Other Instructions

## 2022-11-28 NOTE — Progress Notes (Unsigned)
  Cardiology Office Note:  .   Date:  11/28/2022  ID:  Mary [name], DOB [birth year], MRN [id number] PCP: Laurann Montana, MD  [facility] Providers Cardiologist:  Chilton Si, MD { Click to update primary MD,subspecialty MD or APP then REFRESH:1}   History of Present Illness: .   Mary [name] is a 67 y.o. female ***  Palpitations have been overall similar and most noticeable when she lays down to go to sleep.   ROS: Please see the history of present illness.    All other systems reviewed and are negative.   Studies Reviewed: .        *** Risk Assessment/Calculations:             Physical Exam:   VS:  BP 104/66   Pulse 67   Ht 5\' 2"  (1.575 m)   Wt 227 lb 12.8 oz (103.3 kg)   SpO2 100%   BMI 41.67 kg/m    Wt Readings from Last 3 Encounters:  11/28/22 227 lb 12.8 oz (103.3 kg)  09/01/22 219 lb (99.3 kg)  08/26/22 219 lb (99.3 kg)    GEN: Well nourished, well developed in no acute distress NECK: No JVD; No carotid bruits CARDIAC: ***RRR, no murmurs, rubs, gallops RESPIRATORY:  Clear to auscultation without rales, wheezing or rhonchi  ABDOMEN: Soft, non-tender, non-distended EXTREMITIES:  No edema; No deformity   ASSESSMENT AND PLAN: .   ***       Dispo: follow up in *** months  Signed, Alver Sorrow, NP

## 2022-11-28 NOTE — Telephone Encounter (Signed)
VMT pt requesting call back reference PREP referral  Left cell and office numbers

## 2022-11-30 ENCOUNTER — Encounter (HOSPITAL_BASED_OUTPATIENT_CLINIC_OR_DEPARTMENT_OTHER): Payer: Self-pay | Admitting: Family

## 2022-12-02 ENCOUNTER — Other Ambulatory Visit: Payer: Self-pay | Admitting: Family Medicine

## 2022-12-02 ENCOUNTER — Other Ambulatory Visit (HOSPITAL_COMMUNITY)
Admission: RE | Admit: 2022-12-02 | Discharge: 2022-12-02 | Disposition: A | Discharge status: Home / Self Care | Payer: Medicare HMO | Source: Ambulatory Visit | Attending: Oncology | Admitting: Oncology

## 2022-12-02 DIAGNOSIS — Z1231 Encounter for screening mammogram for malignant neoplasm of breast: Secondary | ICD-10-CM

## 2022-12-02 DIAGNOSIS — Z006 Encounter for examination for normal comparison and control in clinical research program: Secondary | ICD-10-CM | POA: Insufficient documentation

## 2022-12-08 DIAGNOSIS — E119 Type 2 diabetes mellitus without complications: Secondary | ICD-10-CM | POA: Diagnosis not present

## 2022-12-09 LAB — GENECONNECT MOLECULAR SCREEN

## 2022-12-09 LAB — HELIX MOLECULAR SCREEN: Genetic Analysis Overall Interpretation: NEGATIVE

## 2022-12-10 DIAGNOSIS — M4316 Spondylolisthesis, lumbar region: Secondary | ICD-10-CM | POA: Diagnosis not present

## 2022-12-10 DIAGNOSIS — M4722 Other spondylosis with radiculopathy, cervical region: Secondary | ICD-10-CM | POA: Diagnosis not present

## 2022-12-10 DIAGNOSIS — Z6841 Body Mass Index (BMI) 40.0 and over, adult: Secondary | ICD-10-CM | POA: Diagnosis not present

## 2022-12-11 ENCOUNTER — Other Ambulatory Visit (HOSPITAL_COMMUNITY): Payer: Self-pay

## 2022-12-11 ENCOUNTER — Other Ambulatory Visit: Payer: Self-pay

## 2022-12-11 ENCOUNTER — Encounter: Payer: Medicare HMO | Admitting: Podiatry

## 2022-12-11 DIAGNOSIS — E119 Type 2 diabetes mellitus without complications: Secondary | ICD-10-CM | POA: Diagnosis not present

## 2022-12-11 DIAGNOSIS — Z6841 Body Mass Index (BMI) 40.0 and over, adult: Secondary | ICD-10-CM | POA: Diagnosis not present

## 2022-12-11 DIAGNOSIS — E538 Deficiency of other specified B group vitamins: Secondary | ICD-10-CM | POA: Diagnosis not present

## 2022-12-11 DIAGNOSIS — E785 Hyperlipidemia, unspecified: Secondary | ICD-10-CM | POA: Diagnosis not present

## 2022-12-11 DIAGNOSIS — J9611 Chronic respiratory failure with hypoxia: Secondary | ICD-10-CM | POA: Diagnosis not present

## 2022-12-11 DIAGNOSIS — F325 Major depressive disorder, single episode, in full remission: Secondary | ICD-10-CM | POA: Diagnosis not present

## 2022-12-11 DIAGNOSIS — E559 Vitamin D deficiency, unspecified: Secondary | ICD-10-CM | POA: Diagnosis not present

## 2022-12-11 DIAGNOSIS — K625 Hemorrhage of anus and rectum: Secondary | ICD-10-CM | POA: Diagnosis not present

## 2022-12-11 DIAGNOSIS — E1169 Type 2 diabetes mellitus with other specified complication: Secondary | ICD-10-CM | POA: Diagnosis not present

## 2022-12-11 DIAGNOSIS — G4733 Obstructive sleep apnea (adult) (pediatric): Secondary | ICD-10-CM | POA: Diagnosis not present

## 2022-12-11 MED ORDER — MUPIROCIN 2 % EX OINT
1.0000 | TOPICAL_OINTMENT | Freq: Two times a day (BID) | CUTANEOUS | 3 refills | Status: AC | PRN
  Filled 2022-12-11: qty 22, 7d supply, fill #0
  Filled 2023-07-15: qty 22, 7d supply, fill #1
  Filled 2023-11-03: qty 22, 7d supply, fill #2

## 2022-12-11 MED ORDER — OZEMPIC (2 MG/DOSE) 8 MG/3ML ~~LOC~~ SOPN
2.0000 mg | PEN_INJECTOR | SUBCUTANEOUS | 1 refills | Status: DC
  Filled 2022-12-11: qty 3, 28d supply, fill #0
  Filled 2023-01-29: qty 3, 28d supply, fill #1

## 2022-12-11 MED ORDER — HYDROCORTISONE (PERIANAL) 2.5 % EX CREA
1.0000 | TOPICAL_CREAM | Freq: Two times a day (BID) | CUTANEOUS | 0 refills | Status: AC | PRN
  Filled 2022-12-11: qty 30, 12d supply, fill #0

## 2022-12-26 ENCOUNTER — Ambulatory Visit: Payer: Medicare HMO | Admitting: Adult Health

## 2022-12-26 ENCOUNTER — Ambulatory Visit
Admission: RE | Admit: 2022-12-26 | Discharge: 2022-12-26 | Disposition: A | Discharge status: Home / Self Care | Payer: Medicare HMO | Source: Ambulatory Visit

## 2022-12-26 ENCOUNTER — Encounter: Payer: Self-pay | Admitting: Adult Health

## 2022-12-26 VITALS — BP 106/66 | HR 87 | Ht 62.0 in | Wt 227.2 lb

## 2022-12-26 DIAGNOSIS — G4733 Obstructive sleep apnea (adult) (pediatric): Secondary | ICD-10-CM | POA: Diagnosis not present

## 2022-12-26 DIAGNOSIS — J9611 Chronic respiratory failure with hypoxia: Secondary | ICD-10-CM

## 2022-12-26 DIAGNOSIS — Z1231 Encounter for screening mammogram for malignant neoplasm of breast: Secondary | ICD-10-CM | POA: Diagnosis not present

## 2022-12-26 NOTE — Patient Instructions (Addendum)
Continue on CPAP At bedtime with Oxygen 2l/m .  Work on healthy weight (keep up good work) .  Do not drive if sleepy  Follow with Ednah Hammock NP in 1 year and As needed

## 2022-12-26 NOTE — Addendum Note (Signed)
Addended by: Delrae Rend on: 12/26/2022 09:44 AM   Modules accepted: Orders

## 2022-12-26 NOTE — Assessment & Plan Note (Signed)
Continue on Oxygen 2l/m with CPAP At bedtime    Plan  Patient Instructions  Continue on CPAP At bedtime with Oxygen 2l/m .  Work on healthy weight (keep up good work) .  Do not drive if sleepy  Follow with Mary Marschner NP in 1 year and As needed

## 2022-12-26 NOTE — Assessment & Plan Note (Signed)
Excellent control and compliance on CPAP with Oxygen 2l/m   Plan  Patient Instructions  Continue on CPAP At bedtime with Oxygen 2l/m .  Work on healthy weight (keep up good work) .  Do not drive if sleepy  Follow with Mary Miedema NP in 1 year and As needed

## 2022-12-26 NOTE — Assessment & Plan Note (Signed)
Continue with healthy weight loss 

## 2022-12-26 NOTE — Progress Notes (Signed)
@Patient  ID: Mary [name], female    DOB: Aug 30, 1955, 67 y.o.   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  TEST/EVENTS :  PSG 08/17/15 >> AHI 6.1, SpO2 low 89% ONO with 2 liters 12/20/15 >> test time 5 hrs 34 min.  Basal SpO2 94%, low SpO2 83%.  Spent 8 min with SpO2 < 88%. CPAP titration 05/15/16 >> CPAP 10 cm H2O >> AHI 1.2.  Did not need supplemental oxygen. HST 06/12/16 >> 17.2, SaO2 low 82% CPAP 05/17/17 to 06/15/17 >> used on 30 of 30 nights with average 8 hrs 11 min.  Average AHI 3.6 with CPAP 10 cm H2O  ONO with CPAP and 2 liters 10/23/16 >> test time 3 hrs 25 min.  Average SpO2 92%, low SpO2 89%.     Cardiac tests Echo 11/06/15 >> EF 55 to 60%, grade 2 DD  12/26/2022 Follow up: OSA/OHS, O2 RF  Patient returns for a 1 year follow-up.  Patient has obstructive sleep apnea and obesity hypoventilation syndrome on CPAP with oxygen at bedtime.  Patient says she is doing well.  Wears her CPAP with oxygen every single night.  Cannot sleep without it.  Feels that she benefits from CPAP with decreased daytime sleepiness.  CPAP download shows excellent compliance with daily average usage at 7 hours.  Patient is on CPAP 10 cm H2O.  AHI 1.9/hour. Uses a full face mask.  Continues with weight loss.       Allergies  Allergen Reactions   Aspirin Anaphylaxis and Swelling   Chicory Root [Cichorium Intybus] Anaphylaxis, Shortness Of Breath, Itching and Swelling    Swelling of tongue and throat. Scratch throat. cough   Nsaids Anaphylaxis and Swelling   Shellfish Allergy Anaphylaxis and Swelling   Celebrex [Celecoxib] Swelling    Facial swelling    Immunization History  Administered Date(s) Administered   Fluad Quad(high Dose 65+)  10/19/2020, 09/26/2021, 09/22/2022   Fluzone Influenza virus vaccine,trivalent (IIV3), split virus 10/22/2015, 10/27/2016, 10/27/2017, 10/25/2019, 09/27/2020, 10/03/2021   Influenza Split 10/25/2015, 10/18/2016, 10/19/2017   Influenza,inj,quad, With Preservative 10/22/2016   Moderna Sars-Covid-2 Vaccination 01/29/2019, 02/18/2019, 10/30/2021   PFIZER(Purple Top)SARS-COV-2 Vaccination 02/28/2019, 03/21/2019   Pfizer Covid-19 Vaccine Bivalent Booster 65yrs & up 09/27/2020   Pfizer(Comirnaty)Fall Seasonal Vaccine 12 years and older 09/29/2019, 05/22/2020, 06/03/2021   Pneumococcal Conjugate-13 09/13/2018   Pneumococcal Polysaccharide-23 04/15/2016   Respiratory Syncytial Virus Vaccine,Recomb Aduvanted(Arexvy) 09/26/2021   Td (Adult) 06/04/2004   Tdap 08/13/2010, 06/06/2020   Zoster Recombinant(Shingrix) 02/26/2018, 09/02/2018   Zoster, Live 10/16/2015, 02/26/2018, 09/02/2018    Past Medical History:  Diagnosis Date   Anaphylaxis    Chicory root, shellfish   Arthritis    hands, knees, neck - no meds   Bradycardia 01/31/2022   Celiac disease    Depression    Fibromyalgia    GERD (gastroesophageal reflux disease)    no meds   Morbid obesity (HCC) 10/19/2015   NSVT (nonsustained ventricular tachycardia) (HCC) 08/26/2022   PONV (postoperative nausea and vomiting)    SVD (spontaneous vaginal delivery)    x 2   SVT (supraventricular tachycardia) (HCC) 08/26/2022   Systolic murmur 10/19/2015    Tobacco History: Social History   Tobacco Use  Smoking Status Never  Smokeless Tobacco  Never   Counseling given: Not Answered   Outpatient Medications Prior to Visit  Medication Sig Dispense Refill   acetaminophen (TYLENOL) 500 MG tablet Take 1,000 mg by mouth daily as needed for headache.     amoxicillin (AMOXIL) 500 MG capsule Take 4 capsules (2,000 mg total) by mouth 1 hour prior to dental appointment. 8 capsule 99   Ascorbic Acid (VITAMIN C PO) Take 1 tablet by mouth daily.      atorvastatin (LIPITOR) 20 MG tablet Take 20 mg by mouth daily.     Cholecalciferol (VITAMIN D3 PO) Take 1 capsule by mouth daily.     Cyanocobalamin (B-12 PO) Take 1 capsule by mouth daily.     diclofenac sodium (VOLTAREN) 1 % GEL Apply 2 g topically daily as needed (for pain).     diphenhydrAMINE (BENADRYL) 25 MG tablet Take 25 mg by mouth 2 (two) times daily as needed for sleep, allergies or itching.     DULoxetine (CYMBALTA) 30 MG capsule Take 3 capsules (90 mg total) by mouth daily. 270 capsule 1   EPINEPHrine 0.3 mg/0.3 mL IJ SOAJ injection Inject 0.3 mg into the muscle as needed for anaphylaxis. 2 each 1   glucose blood (FREESTYLE LITE) test strip Use to test your blood sugar daily 100 strip 3   hydrocortisone (ANUSOL-HC) 2.5 % rectal cream Place 1 Application rectally 2 (two) times daily as needed for rectal irritation for up to 1 week. 30 g 0   Lancets (FREESTYLE) lancets Use to check blood sugar daily 100 each 3   metFORMIN (GLUCOPHAGE-XR) 500 MG 24 hr tablet Take 1,000 mg by mouth daily with breakfast.     metroNIDAZOLE (METROCREAM) 0.75 % cream Apply a small amount to skin twice a day. 45 g 2   mupirocin ointment (BACTROBAN) 2 % Apply 1 Application topically 2 (two) times daily as needed. 22 g 3   Omega-3 Fatty Acids (FISH OIL PO) Take 2 capsules by mouth daily.     Semaglutide, 2 MG/DOSE, (OZEMPIC, 2 MG/DOSE,) 8 MG/3ML SOPN Inject 2 mg into the skin once a week. 3 mL 1   Semaglutide, 1 MG/DOSE, (OZEMPIC, 1 MG/DOSE,) 4 MG/3ML SOPN Inject 1 mg into the skin once a week. (Patient not taking: Reported on 12/26/2022) 6 mL 1   No facility-administered medications prior to visit.     Review of Systems:   Constitutional:   No  weight loss, night sweats,  Fevers, chills, fatigue, or  lassitude.  HEENT:   No headaches,  Difficulty swallowing,  Tooth/dental problems, or  Sore throat,                No sneezing, itching, ear ache, nasal congestion, [name] nasal drip,   CV:  No chest pain,   Orthopnea, PND, swelling in lower extremities, anasarca, dizziness, palpitations, syncope.   GI  No heartburn, indigestion, abdominal pain, nausea, vomiting, diarrhea, change in bowel habits, loss of appetite, bloody stools.   Resp: No shortness of breath with exertion or at rest.  No excess mucus, no productive cough,  No non-productive cough,  No coughing up of blood.  No change in color of mucus.  No wheezing.  No chest wall deformity  Skin: no rash or lesions.  GU: no dysuria, change in color of urine, no urgency or frequency.  No flank pain, no hematuria   MS:  No joint pain or swelling.  No decreased range of motion.  No back pain.    Physical Exam  BP 106/66 (BP Location: Left Arm, Patient Position: Sitting, Cuff Size: Normal)   Pulse 87   Ht 5\' 2"  (1.575 m)   Wt 227 lb 3.2 oz (103.1 kg)   SpO2 97%   BMI 41.56 kg/m   GEN: A/Ox3; pleasant , NAD, well nourished    HEENT:  [place]/AT,  NOSE-clear, THROAT-clear, no lesions, no postnasal drip or exudate noted.   NECK:  Supple w/ fair ROM; no JVD; normal carotid impulses w/o bruits; no thyromegaly or nodules palpated; no lymphadenopathy.    RESP  Clear  P & A; w/o, wheezes/ rales/ or rhonchi. no accessory muscle use, no dullness to percussion  CARD:  RRR, no m/r/g, no peripheral edema, pulses intact, no cyanosis or clubbing.  GI:   Soft & nt; nml bowel sounds; no organomegaly or masses detected.   Musco: Warm bil, no deformities or joint swelling noted.   Neuro: alert, no focal deficits noted.    Skin: Warm, no lesions or rashes    Lab Results:  CBC     BNP No results found for: "BNP"  ProBNP No results found for: "PROBNP"  Imaging: No results found.  Administration History     None           No data to display          No results found for: "NITRICOXIDE"      Assessment & Plan:   OSA (obstructive sleep apnea) Excellent control and compliance on CPAP with Oxygen 2l/m   Plan  Patient  Instructions  Continue on CPAP At bedtime with Oxygen 2l/m .  Work on healthy weight (keep up good work) .  Do not drive if sleepy  Follow with Alvester Eads NP in 1 year and As needed      Chronic respiratory failure with hypoxia (HCC) Continue on Oxygen 2l/m with CPAP At bedtime    Plan  Patient Instructions  Continue on CPAP At bedtime with Oxygen 2l/m .  Work on healthy weight (keep up good work) .  Do not drive if sleepy  Follow with Vanessa Kampf NP in 1 year and As needed      Morbid obesity (HCC) Continue with healthy weight loss     Rubye Oaks, NP 12/26/2022

## 2023-01-01 ENCOUNTER — Telehealth: Payer: Self-pay | Admitting: Podiatry

## 2023-01-01 NOTE — Telephone Encounter (Signed)
DOS-01/16/23  KELLER MCBRIDE BUNION [id number] METATARSAL OSTEOTOMY 2ND [id number] TENOTOMY SINGLE 3RD, 4TH AND 5TH RT-28010  [employer] EFFECTIVE DATE- 01/20/21  OOP-$3600.00 WITH REMAINING $2,657.08  COINSURANCE- 0%  PER THE [facility] WEBSITE PORTAL, PRIOR AUTH IS NOT REQUIRED FOR CPT CODE 45409. ADDITIONALLY PER THE [facility] [employer]ITE PORTAL, PRIOR AUTH HAS BEEN APPROVED FOR CPT CODES 81191 AND [phone]. AUTH GOOD FOR 01/16/23  Authorization #[id number]  Tracking #[id number]

## 2023-01-15 ENCOUNTER — Other Ambulatory Visit: Payer: Self-pay | Admitting: Podiatry

## 2023-01-15 ENCOUNTER — Other Ambulatory Visit (HOSPITAL_COMMUNITY): Payer: Self-pay

## 2023-01-15 MED ORDER — HYDROCODONE-ACETAMINOPHEN 10-325 MG PO TABS
1.0000 | ORAL_TABLET | Freq: Four times a day (QID) | ORAL | 0 refills | Status: AC | PRN
  Filled 2023-01-15: qty 28, 7d supply, fill #0

## 2023-01-15 MED ORDER — ONDANSETRON HCL 4 MG PO TABS
4.0000 mg | ORAL_TABLET | Freq: Three times a day (TID) | ORAL | 0 refills | Status: AC | PRN
  Filled 2023-01-15: qty 20, 7d supply, fill #0

## 2023-01-15 MED ORDER — CEPHALEXIN 500 MG PO CAPS
500.0000 mg | ORAL_CAPSULE | Freq: Three times a day (TID) | ORAL | 0 refills | Status: DC
  Filled 2023-01-15: qty 30, 10d supply, fill #0

## 2023-01-16 DIAGNOSIS — G8918 Other acute postprocedural pain: Secondary | ICD-10-CM | POA: Diagnosis not present

## 2023-01-16 DIAGNOSIS — M21541 Acquired clubfoot, right foot: Secondary | ICD-10-CM | POA: Diagnosis not present

## 2023-01-16 DIAGNOSIS — M2011 Hallux valgus (acquired), right foot: Secondary | ICD-10-CM | POA: Diagnosis not present

## 2023-01-16 DIAGNOSIS — M2041 Other hammer toe(s) (acquired), right foot: Secondary | ICD-10-CM | POA: Diagnosis not present

## 2023-01-16 DIAGNOSIS — M216X1 Other acquired deformities of right foot: Secondary | ICD-10-CM | POA: Diagnosis not present

## 2023-01-22 ENCOUNTER — Ambulatory Visit (INDEPENDENT_AMBULATORY_CARE_PROVIDER_SITE_OTHER): Payer: Medicare HMO | Admitting: Podiatry

## 2023-01-22 ENCOUNTER — Ambulatory Visit (INDEPENDENT_AMBULATORY_CARE_PROVIDER_SITE_OTHER): Payer: Self-pay

## 2023-01-22 ENCOUNTER — Encounter: Payer: Self-pay | Admitting: Podiatry

## 2023-01-22 DIAGNOSIS — M2041 Other hammer toe(s) (acquired), right foot: Secondary | ICD-10-CM

## 2023-01-22 DIAGNOSIS — M2011 Hallux valgus (acquired), right foot: Secondary | ICD-10-CM

## 2023-01-22 DIAGNOSIS — Z9889 Other specified postprocedural states: Secondary | ICD-10-CM

## 2023-01-22 NOTE — Progress Notes (Signed)
 She presents today for her first postop visit date of surgery was 01/16/2023.  She denies fever chills nausea right muscle aches pains calf pain back pain chest pain shortness of breath.  She is status post McBride bunion repair flexor tenotomy's 345 and a pinning of the second toe with the second metatarsal osteotomy.  Objective: Vital signs are stable oriented x 3 presents today cam boot and cane.  Ambulating normally.  Once the cam boot was removed Restyl dressing was removed demonstrates mild edema sutures are intact margins well coapted K wires intact to the second toe radiographically everything appears to be in good position second metatarsal osteotomy with screw fixation is in good position K wires in the second toe only for purposes of correcting the angular deformity.  Otherwise the digits 3 4 and 5 are rectus.  Assessment: Well-healing surgical foot.  Plan: Discussed etiology pathology conservative surgical therapies I will follow-up with her in about 2 weeks.  We redressed her today dressed a compressive dressing reapplied her cam boot I will follow-up with her with any questions or concerns otherwise postop visit for suture removal next.  K wire will remain in for at least 6 weeks.

## 2023-01-30 ENCOUNTER — Other Ambulatory Visit (HOSPITAL_COMMUNITY): Payer: Self-pay

## 2023-02-03 ENCOUNTER — Encounter: Payer: Medicare HMO | Admitting: Podiatry

## 2023-02-05 ENCOUNTER — Encounter: Payer: Self-pay | Admitting: Podiatry

## 2023-02-05 ENCOUNTER — Ambulatory Visit (INDEPENDENT_AMBULATORY_CARE_PROVIDER_SITE_OTHER): Payer: Self-pay | Admitting: Podiatry

## 2023-02-05 VITALS — Ht 62.0 in | Wt 227.2 lb

## 2023-02-05 DIAGNOSIS — M2041 Other hammer toe(s) (acquired), right foot: Secondary | ICD-10-CM

## 2023-02-05 DIAGNOSIS — M2011 Hallux valgus (acquired), right foot: Secondary | ICD-10-CM

## 2023-02-05 DIAGNOSIS — Z9889 Other specified postprocedural states: Secondary | ICD-10-CM

## 2023-02-05 NOTE — Progress Notes (Signed)
She presents today for her first postop visit date of surgery was 01/16/2023.  She denies fever chills nausea right muscle aches pains calf pain back pain chest pain shortness of breath.  She is status post McBride bunion repair flexor tenotomy's 345 and a pinning of the second toe with the second metatarsal osteotomy.  Objective: Vital signs are stable oriented x 3 presents today cam boot and cane.  Ambulating normally.  Once the cam boot was removed Restyl dressing was removed demonstrates mild edema sutures are intact margins well coapted K wires intact to the second toe radiographically everything appears to be in good position second metatarsal osteotomy with screw fixation is in good position K wires in the second toe only for purposes of correcting the angular deformity.  Otherwise the digits 3 4 and 5 are rectus.  Assessment: Well-healing surgical foot.  Plan: Discussed etiology pathology conservative surgical therapies sutures were removed.  No dehiscence noted.  Pin is intact.  She will see Dr. Al Corpus back again in 3 weeks for pin removal.  Transition to surgical shoe.  Surgical shoe was dispensed

## 2023-02-26 ENCOUNTER — Encounter: Payer: Self-pay | Admitting: Podiatry

## 2023-02-26 ENCOUNTER — Ambulatory Visit (INDEPENDENT_AMBULATORY_CARE_PROVIDER_SITE_OTHER): Payer: PPO | Admitting: Podiatry

## 2023-02-26 ENCOUNTER — Ambulatory Visit (INDEPENDENT_AMBULATORY_CARE_PROVIDER_SITE_OTHER): Payer: PPO

## 2023-02-26 ENCOUNTER — Other Ambulatory Visit (HOSPITAL_COMMUNITY): Payer: Self-pay

## 2023-02-26 DIAGNOSIS — M2011 Hallux valgus (acquired), right foot: Secondary | ICD-10-CM

## 2023-02-26 DIAGNOSIS — M4316 Spondylolisthesis, lumbar region: Secondary | ICD-10-CM | POA: Insufficient documentation

## 2023-02-26 DIAGNOSIS — E538 Deficiency of other specified B group vitamins: Secondary | ICD-10-CM | POA: Insufficient documentation

## 2023-02-26 DIAGNOSIS — Z9889 Other specified postprocedural states: Secondary | ICD-10-CM

## 2023-02-26 DIAGNOSIS — M2041 Other hammer toe(s) (acquired), right foot: Secondary | ICD-10-CM

## 2023-02-26 MED ORDER — OZEMPIC (2 MG/DOSE) 8 MG/3ML ~~LOC~~ SOPN
PEN_INJECTOR | SUBCUTANEOUS | 1 refills | Status: DC
  Filled 2023-02-26: qty 3, 28d supply, fill #0
  Filled 2023-03-27: qty 3, 28d supply, fill #1

## 2023-02-26 NOTE — Progress Notes (Signed)
 She presents today for follow-up of McBride bunion repair and a second metatarsal osteotomy with pinning of the toe and flexor tenotomy's to the rest of the toes.  She is done very well with this and is very happy with the outcome ready to have the pin removed and get back to her regular life.  Objective: Vital signs are stable alert and oriented x 3.  Pulses are palpable.  Incision sites: Healing very nicely K wire is intact to the second toe.  There is a single flexor tenotomy suture intact to the fifth digit which I removed today.  But the K wire is tender for her and so I went ahead and put a little bit of local anesthetic around the toe.  Radiographs taken today demonstrate an osseously mature individual K wires intact into the second metatarsal screws appear to be in good position and retained.  Assessment: Well-healing surgical foot remove K wire today with local anesthetic and allow her to get into a Darco shoe and hopefully back into her regular shoe gear within the next few weeks.  I like to follow-up with her in about 3 weeks for another set of x-rays

## 2023-02-27 ENCOUNTER — Other Ambulatory Visit (HOSPITAL_COMMUNITY): Payer: Self-pay

## 2023-03-03 ENCOUNTER — Other Ambulatory Visit (HOSPITAL_COMMUNITY): Payer: Self-pay

## 2023-03-03 MED ORDER — AMOXICILLIN 500 MG PO CAPS
2000.0000 mg | ORAL_CAPSULE | ORAL | 99 refills | Status: AC
  Filled 2023-03-03: qty 8, 2d supply, fill #0

## 2023-03-24 ENCOUNTER — Ambulatory Visit (INDEPENDENT_AMBULATORY_CARE_PROVIDER_SITE_OTHER)

## 2023-03-24 ENCOUNTER — Encounter: Payer: Self-pay | Admitting: Podiatry

## 2023-03-24 ENCOUNTER — Ambulatory Visit: Payer: PPO | Admitting: Podiatry

## 2023-03-24 DIAGNOSIS — M2011 Hallux valgus (acquired), right foot: Secondary | ICD-10-CM | POA: Diagnosis not present

## 2023-03-24 DIAGNOSIS — M2041 Other hammer toe(s) (acquired), right foot: Secondary | ICD-10-CM

## 2023-03-24 DIAGNOSIS — Z9889 Other specified postprocedural states: Secondary | ICD-10-CM

## 2023-03-24 NOTE — Progress Notes (Signed)
 She presents today for postop visit right foot.  She states that seems to be doing pretty well with still swells on occasion.  Date of surgery 01/16/2023.  Objective: Vital signs stable alert oriented x 3.  First metatarsal phalangeal joint is perfectly straight with good range of motion no crepitation.  Second toe slightly elevated due to contracture at the second metatarsophalangeal joint due to scar tissue.  Otherwise it has good range of motion with medial deviation of toes 345 medially.  Assessment well-healing surgical foot.  Plan: I did dispense a hammertoe corrector for her today we will follow-up with her on an as-needed basis.

## 2023-04-14 ENCOUNTER — Other Ambulatory Visit (HOSPITAL_COMMUNITY): Payer: Self-pay

## 2023-04-14 MED ORDER — METFORMIN HCL ER 500 MG PO TB24
1000.0000 mg | ORAL_TABLET | Freq: Every evening | ORAL | 0 refills | Status: DC
  Filled 2023-04-14: qty 180, 90d supply, fill #0

## 2023-04-14 MED ORDER — ATORVASTATIN CALCIUM 20 MG PO TABS
20.0000 mg | ORAL_TABLET | Freq: Every day | ORAL | 0 refills | Status: DC
  Filled 2023-04-14: qty 90, 90d supply, fill #0

## 2023-04-14 MED ORDER — DULOXETINE HCL 30 MG PO CPEP
90.0000 mg | ORAL_CAPSULE | Freq: Every day | ORAL | 0 refills | Status: AC
  Filled 2023-04-14: qty 270, 90d supply, fill #0

## 2023-04-24 ENCOUNTER — Other Ambulatory Visit (HOSPITAL_COMMUNITY): Payer: Self-pay

## 2023-04-29 ENCOUNTER — Other Ambulatory Visit (HOSPITAL_COMMUNITY): Payer: Self-pay

## 2023-04-29 MED ORDER — OZEMPIC (2 MG/DOSE) 8 MG/3ML ~~LOC~~ SOPN
2.0000 mg | PEN_INJECTOR | SUBCUTANEOUS | 1 refills | Status: DC
Start: 2023-04-29 — End: 2023-06-19
  Filled 2023-04-29: qty 3, 28d supply, fill #0
  Filled 2023-05-28: qty 3, 28d supply, fill #1

## 2023-06-19 ENCOUNTER — Other Ambulatory Visit (HOSPITAL_COMMUNITY): Payer: Self-pay

## 2023-06-19 DIAGNOSIS — F322 Major depressive disorder, single episode, severe without psychotic features: Secondary | ICD-10-CM | POA: Diagnosis not present

## 2023-06-19 DIAGNOSIS — M5136 Other intervertebral disc degeneration, lumbar region with discogenic back pain only: Secondary | ICD-10-CM | POA: Diagnosis not present

## 2023-06-19 DIAGNOSIS — M8580 Other specified disorders of bone density and structure, unspecified site: Secondary | ICD-10-CM | POA: Diagnosis not present

## 2023-06-19 DIAGNOSIS — M797 Fibromyalgia: Secondary | ICD-10-CM | POA: Diagnosis not present

## 2023-06-19 DIAGNOSIS — E559 Vitamin D deficiency, unspecified: Secondary | ICD-10-CM | POA: Diagnosis not present

## 2023-06-19 DIAGNOSIS — E785 Hyperlipidemia, unspecified: Secondary | ICD-10-CM | POA: Diagnosis not present

## 2023-06-19 DIAGNOSIS — Z0000 Encounter for general adult medical examination without abnormal findings: Secondary | ICD-10-CM | POA: Diagnosis not present

## 2023-06-19 DIAGNOSIS — M25511 Pain in right shoulder: Secondary | ICD-10-CM | POA: Diagnosis not present

## 2023-06-19 DIAGNOSIS — J9611 Chronic respiratory failure with hypoxia: Secondary | ICD-10-CM | POA: Diagnosis not present

## 2023-06-19 DIAGNOSIS — E1169 Type 2 diabetes mellitus with other specified complication: Secondary | ICD-10-CM | POA: Diagnosis not present

## 2023-06-19 DIAGNOSIS — E538 Deficiency of other specified B group vitamins: Secondary | ICD-10-CM | POA: Diagnosis not present

## 2023-06-19 MED ORDER — DULOXETINE HCL 60 MG PO CPEP
120.0000 mg | ORAL_CAPSULE | Freq: Every day | ORAL | 1 refills | Status: DC
  Filled 2023-06-19: qty 180, 90d supply, fill #0
  Filled 2023-10-20: qty 180, 90d supply, fill #1

## 2023-06-19 MED ORDER — FREESTYLE LITE TEST VI STRP
ORAL_STRIP | 3 refills | Status: AC
  Filled 2023-06-19: qty 100, 90d supply, fill #0

## 2023-06-19 MED ORDER — OZEMPIC (2 MG/DOSE) 8 MG/3ML ~~LOC~~ SOPN
2.0000 mg | PEN_INJECTOR | SUBCUTANEOUS | 1 refills | Status: DC
  Filled 2023-06-19: qty 3, 28d supply, fill #0
  Filled 2023-07-21: qty 3, 28d supply, fill #1

## 2023-06-19 MED ORDER — METFORMIN HCL ER 500 MG PO TB24
1000.0000 mg | ORAL_TABLET | Freq: Every evening | ORAL | 1 refills | Status: DC
  Filled 2023-07-14: qty 180, 90d supply, fill #0
  Filled 2023-10-20: qty 180, 90d supply, fill #1

## 2023-06-19 MED ORDER — ATORVASTATIN CALCIUM 20 MG PO TABS
20.0000 mg | ORAL_TABLET | Freq: Every day | ORAL | 3 refills | Status: AC
  Filled 2023-11-12: qty 90, 90d supply, fill #0
  Filled 2024-02-11: qty 90, 90d supply, fill #1

## 2023-06-22 DIAGNOSIS — G4734 Idiopathic sleep related nonobstructive alveolar hypoventilation: Secondary | ICD-10-CM | POA: Diagnosis not present

## 2023-06-22 DIAGNOSIS — G4733 Obstructive sleep apnea (adult) (pediatric): Secondary | ICD-10-CM | POA: Diagnosis not present

## 2023-07-02 ENCOUNTER — Other Ambulatory Visit (HOSPITAL_COMMUNITY): Payer: Self-pay

## 2023-07-14 DIAGNOSIS — M791 Myalgia, unspecified site: Secondary | ICD-10-CM | POA: Diagnosis not present

## 2023-07-14 DIAGNOSIS — M25511 Pain in right shoulder: Secondary | ICD-10-CM | POA: Diagnosis not present

## 2023-07-14 DIAGNOSIS — M797 Fibromyalgia: Secondary | ICD-10-CM | POA: Diagnosis not present

## 2023-07-15 ENCOUNTER — Other Ambulatory Visit (HOSPITAL_COMMUNITY): Payer: Self-pay

## 2023-07-16 ENCOUNTER — Other Ambulatory Visit (HOSPITAL_COMMUNITY): Payer: Self-pay

## 2023-07-16 ENCOUNTER — Other Ambulatory Visit: Payer: Self-pay | Admitting: Allergy

## 2023-07-16 MED ORDER — METRONIDAZOLE 0.75 % EX CREA
TOPICAL_CREAM | CUTANEOUS | 3 refills | Status: AC
  Filled 2023-07-16: qty 45, 30d supply, fill #0

## 2023-07-20 NOTE — Telephone Encounter (Signed)
 Refill denied for EpiPen  0.3 mg/mL. Patient has not been seen since 2023. Patient was called to see if she could make an appointment. No answer, message left for return call.

## 2023-07-21 IMAGING — MG MM DIGITAL SCREENING BILAT W/ TOMO AND CAD
8 series · 8 of 24 positions shown · non-contrast
Comparison: Previous exam(s).

CLINICAL DATA: Screening.

EXAM:
DIGITAL SCREENING BILATERAL MAMMOGRAM WITH TOMOSYNTHESIS AND CAD
TECHNIQUE: Bilateral screening digital craniocaudal and mediolateral oblique
mammograms were obtained. Bilateral screening digital breast
tomosynthesis was performed. The images were evaluated with
computer-aided detection.

[L MLO synth-2D]
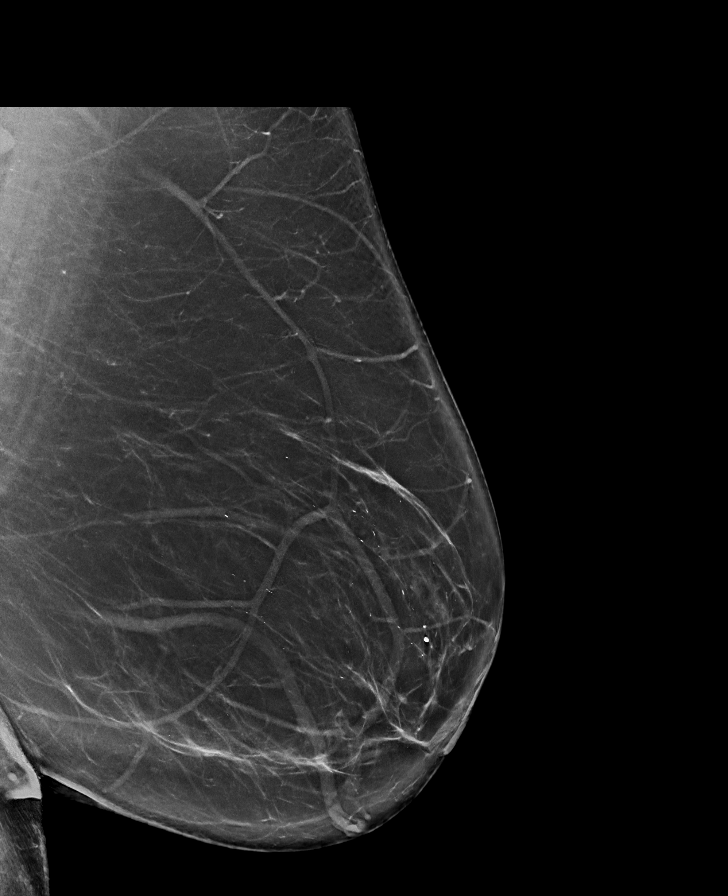

[R MLO synth-2D]
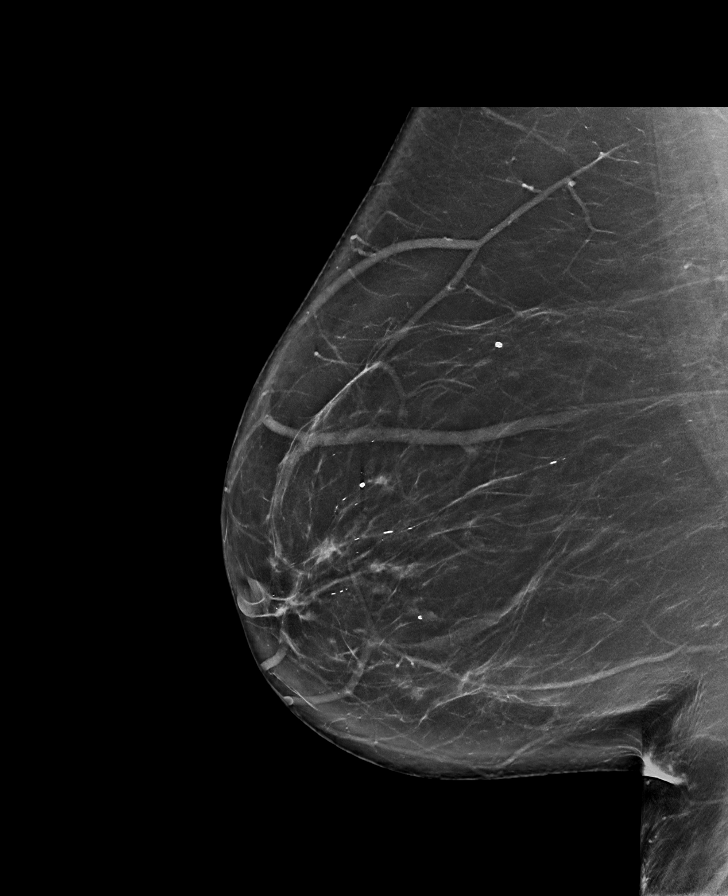

[R CC synth-2D]
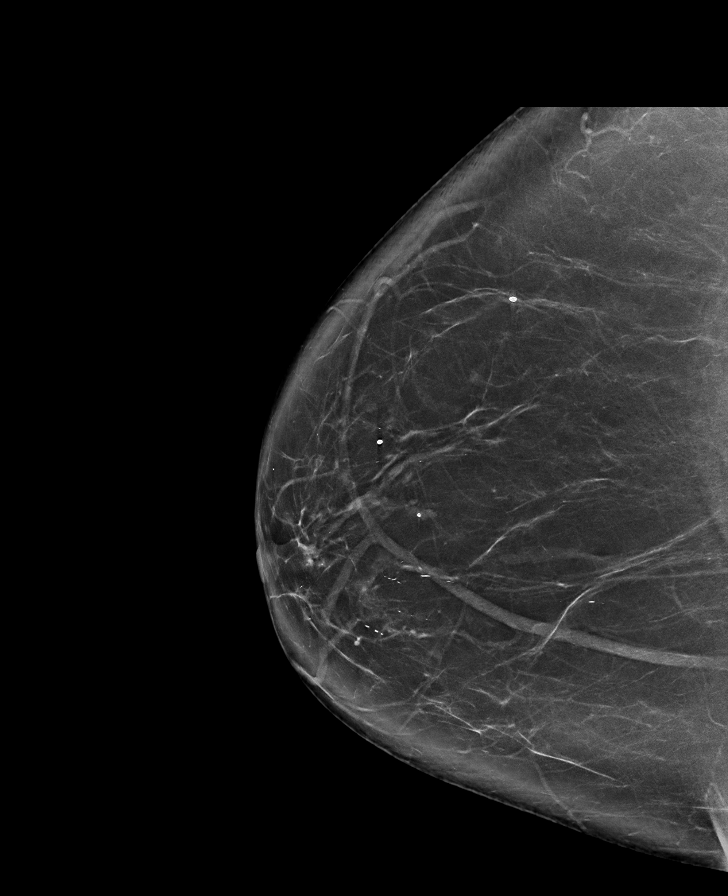

[L CC synth-2D]
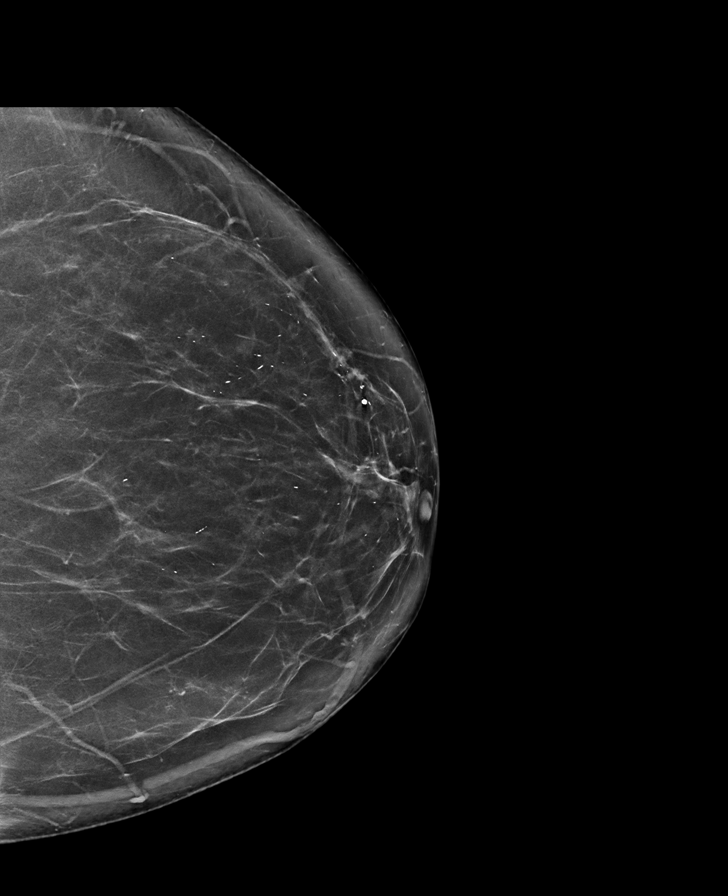

[R MLO tomo · tomo slice 46/91.0]
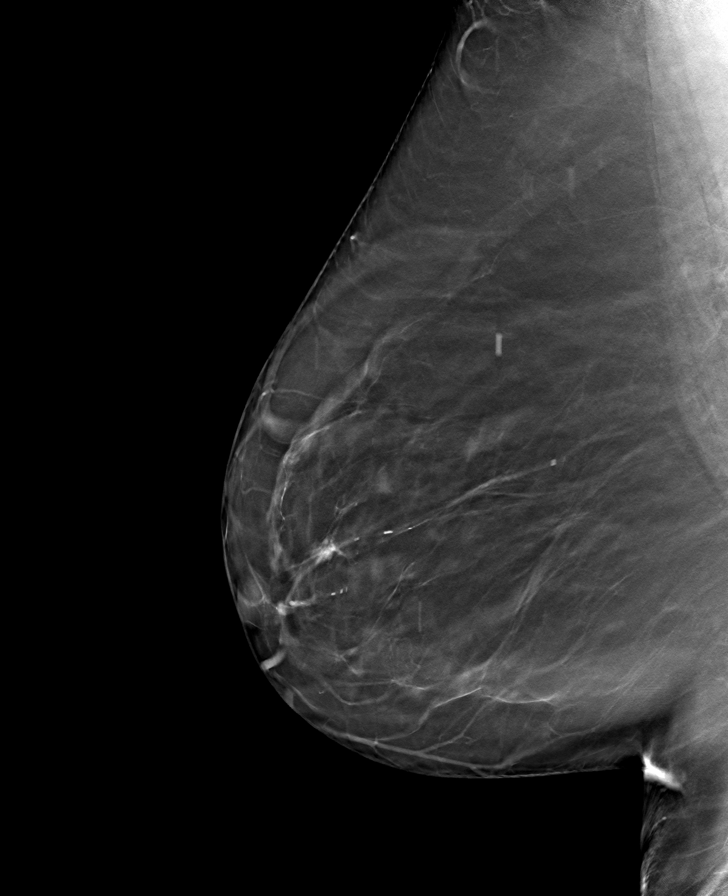

[R CC tomo · tomo slice 46/91.0]
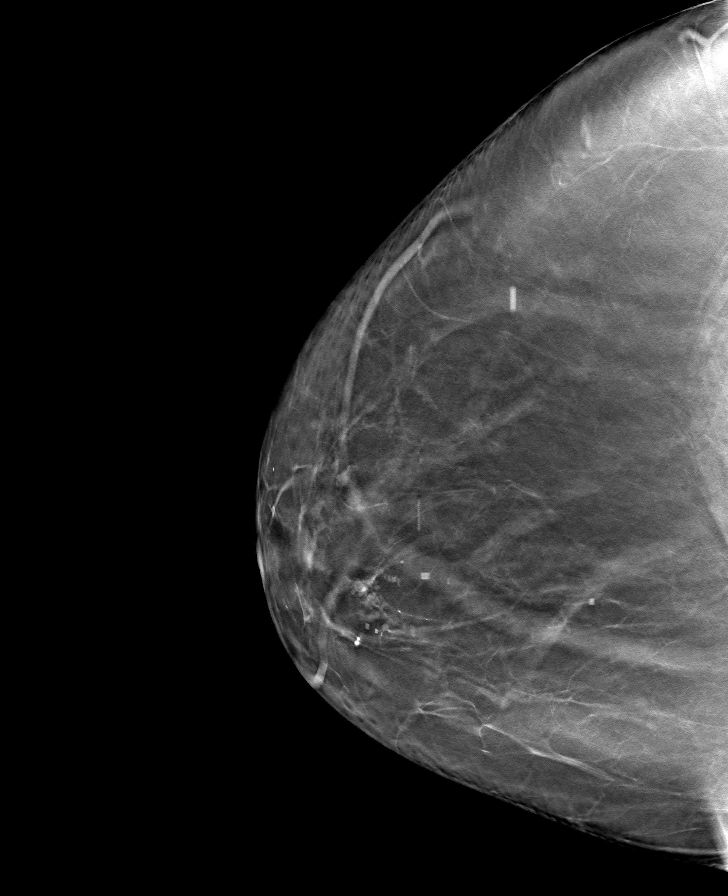

[L MLO tomo · tomo slice 49/96.0]
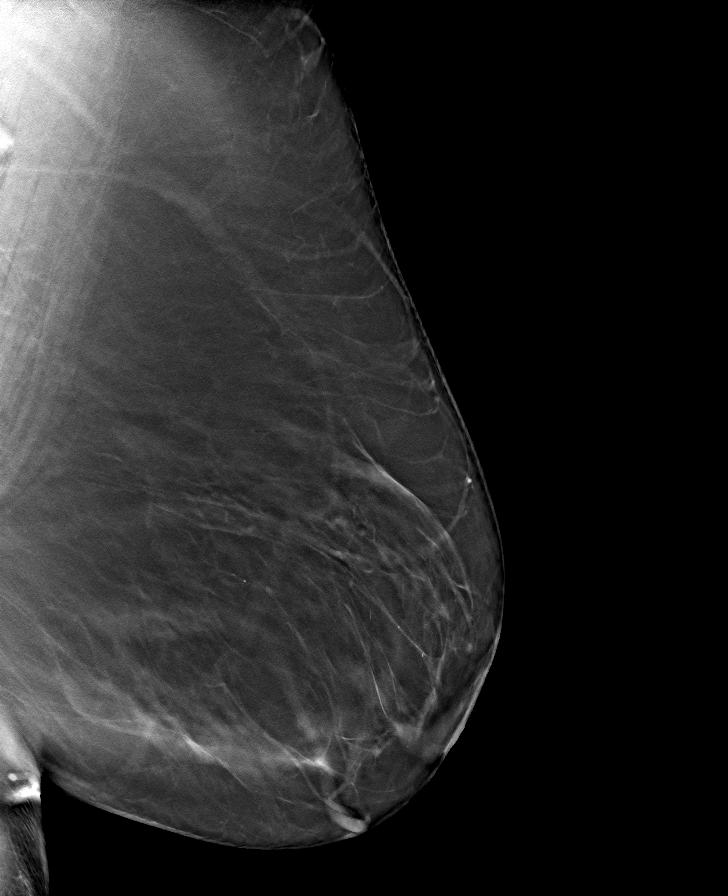

[L CC tomo · tomo slice 43/84.0]
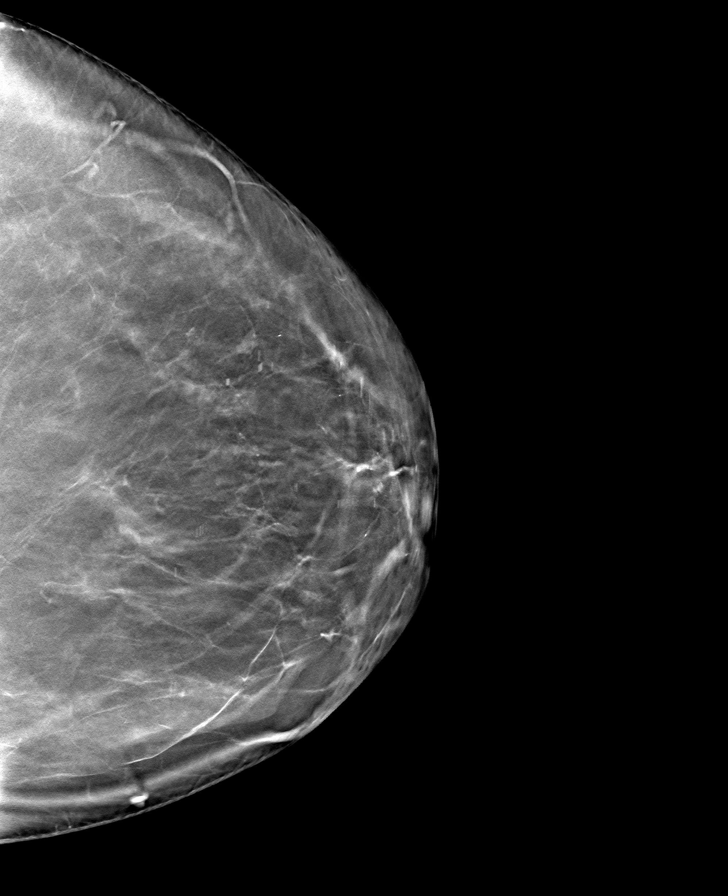

[8 of 24 positions shown; findings below may reference images not displayed]

ACR Breast Density Category b: There are scattered areas of
fibroglandular density.
FINDINGS: There are no findings suspicious for malignancy.
IMPRESSION: No mammographic evidence of malignancy. A result letter of this
screening mammogram will be mailed directly to the patient.

RECOMMENDATION:
Screening mammogram in one year. (Code:51-O-LD2)

BI-RADS CATEGORY  1: Negative.

## 2023-07-22 ENCOUNTER — Other Ambulatory Visit (HOSPITAL_COMMUNITY): Payer: Self-pay

## 2023-08-22 ENCOUNTER — Other Ambulatory Visit (HOSPITAL_COMMUNITY): Payer: Self-pay

## 2023-08-24 NOTE — Patient Instructions (Incomplete)
 We have discussed the following in regards to foods:   Allergy : food allergy  is when you have eaten a food, developed an allergic reaction after eating the food and have IgE to the food (positive food testing either by skin testing or blood testing).  Food allergy  could lead to life threatening symptoms  Sensitivity: occurs when you have IgE to a food (positive food testing either by skin testing or blood testing) but is a food you eat without any issues.  This is not an allergy  and we recommend keeping the food in the diet  Intolerance: this is when you have negative testing by either skin testing or blood testing thus not allergic but the food causes symptoms (like belly pain, bloating, diarrhea etc) with ingestion.  These foods should be avoided to prevent symptoms.     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  Will call you with results and will be available via MyChart - should significant symptoms recur or new symptoms occur, a journal is to be kept recording any foods eaten, beverages consumed, medications taken, activities performed, and environmental conditions within a 6 hour time period prior to the onset of symptoms.    Food allergy  - testing to shellfish is positive to crab - we do not have skin or blood testing for chicory root - continue avoidance of shellfish and chicory root  - have access to self-injectable epinephrine  (Epipen  or AuviQ) 0.3mg  at all times - follow emergency action plan in case of allergic reaction  Follow-up 1 year or sooner if needed

## 2023-08-25 ENCOUNTER — Ambulatory Visit: Admitting: Family

## 2023-08-25 ENCOUNTER — Encounter: Payer: Self-pay | Admitting: Family

## 2023-08-25 ENCOUNTER — Other Ambulatory Visit: Payer: Self-pay

## 2023-08-25 ENCOUNTER — Other Ambulatory Visit (HOSPITAL_COMMUNITY): Payer: Self-pay

## 2023-08-25 VITALS — BP 118/80 | HR 81 | Temp 98.4°F | Wt 222.2 lb

## 2023-08-25 DIAGNOSIS — R21 Rash and other nonspecific skin eruption: Secondary | ICD-10-CM

## 2023-08-25 DIAGNOSIS — Z888 Allergy status to other drugs, medicaments and biological substances status: Secondary | ICD-10-CM | POA: Diagnosis not present

## 2023-08-25 DIAGNOSIS — T7800XD Anaphylactic reaction due to unspecified food, subsequent encounter: Secondary | ICD-10-CM | POA: Diagnosis not present

## 2023-08-25 DIAGNOSIS — T50905D Adverse effect of unspecified drugs, medicaments and biological substances, subsequent encounter: Secondary | ICD-10-CM

## 2023-08-25 DIAGNOSIS — T7800XA Anaphylactic reaction due to unspecified food, initial encounter: Secondary | ICD-10-CM

## 2023-08-25 MED ORDER — OZEMPIC (2 MG/DOSE) 8 MG/3ML ~~LOC~~ SOPN
2.0000 mg | PEN_INJECTOR | SUBCUTANEOUS | 1 refills | Status: DC
  Filled 2023-08-25: qty 3, 28d supply, fill #0
  Filled 2023-09-22: qty 3, 28d supply, fill #1

## 2023-08-25 MED ORDER — EPINEPHRINE 0.3 MG/0.3ML IJ SOAJ
0.3000 mg | INTRAMUSCULAR | 1 refills | Status: AC | PRN
  Filled 2023-08-25: qty 2, 7d supply, fill #0

## 2023-08-25 MED ORDER — DESONIDE 0.05 % EX CREA
TOPICAL_CREAM | CUTANEOUS | 0 refills | Status: AC
  Filled 2023-08-25: qty 30, 30d supply, fill #0

## 2023-08-25 NOTE — Progress Notes (Signed)
 [address] [place] [address] Dept: [phone]  FOLLOW UP NOTE  Patient ID: Mary [name], female    DOB: Mar 09, 1955  Age: 68 y.o. MRN: [id number] Date of Office Visit: 08/25/2023  Assessment  Chief Complaint: Medication Refill and Follow-up (No concerns)  HPI Mary [name] is a 68 year old female who presents today for follow-up of food intolerance, food allergy , adverse food medications, and contact dermatitis.  She was last seen on August 29, 2021 by Dr. Jeneal.  She reports since her last office visit in 2024 she had surgery on her right foot for hammertoe and bunion.  Food intolerance: She reports that after avoiding all caffeine and coffee for a while she did not notice it making any difference in her abdominal symptoms.  Food allergy : She continues to avoid shellfish without any accidental ingestion. She also tries to avoid chicory root,but it is harder.  She did have incidence since her last office visit where she was eating vanilla ice cream at [employer] and immediately started having symptoms.  She took Benadryl  and did not use her epinephrine  autoinjector device.  She reports that she did feel bad and was swollen.  She called the next day and was told that the ice cream did have chicory root in it.  She reports that both shellfish and chicory root cause her to have first feel like she is getting a cold with a runny nose, but within 5 minutes the inside of her jaws swell, and her tongue and her throat swell.  Then sometimes it will get into her eyes the next day her eyes and face will be swollen.  She reports her reaction generally stays above her shoulders.  Adverse medication effects: She continues to avoid all NSAIDs and aspirin.  She reports lip swelling with Advil .  Contact dermatitis: She reports that she cannot remember the rash she was having at the time she saw Dr. Jeneal so it must be gone.  She does mention however that since Saturday she has had an  itchy rash on her neck.  It is not itchy all the time, but some of the time.  It does not burn.  She has been putting an antibiotic ointment on it that she does not know the name of.  She denies any concomitant cardiorespiratory gastrointestinal symptoms.  She denies any new products, foods, or medications.  She lives by herself, but does have a cat.  She mention she is not allergic to the cat.  She denies any fevers or chills.  She has her usual joint pain.  She is up-to-date on her vaccinations.  She mentions that she is sensitive to bug bites such as mosquitoes.  She did get a bug bite the middle of last week on her back in her arm, but mentions this does not normally cause her to have a rash.  She does have an upcoming appointment this Thursday with her dermatologist, Dr. Bonney.  She does not normally wear necklaces in the summer and reports that she has not worn one recently.  She denies any history of eczema.   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 Scratch throat. cough   Inulin Anaphylaxis   Nsaids Anaphylaxis and Swelling   Shellfish Allergy  Anaphylaxis and Swelling   Celebrex [Celecoxib] Swelling    Facial swelling  Iodine     Review of Systems: Negative except as per HPI   Physical Exam: BP 118/80   Pulse 81   Temp 98.4 F (36.9 C)   Wt 222 lb 3.2 oz (100.8 kg)   SpO2 97%   BMI 40.64 kg/m    Physical Exam Constitutional:      Appearance: Normal appearance.  HENT:     Head: Normocephalic and atraumatic.     Comments: Pharynx normal, eyes normal, ears normal, nose normal    Right Ear: Tympanic membrane, ear canal and external ear normal.     Left Ear: Tympanic membrane, ear canal and external ear normal.     Nose: Nose normal.     Mouth/Throat:     Mouth: Mucous membranes are moist.     Pharynx:  Oropharynx is clear.  Eyes:     Conjunctiva/sclera: Conjunctivae normal.  Cardiovascular:     Rate and Rhythm: Regular rhythm.     Heart sounds: Normal heart sounds.  Pulmonary:     Effort: Pulmonary effort is normal.     Breath sounds: Normal breath sounds.     Comments: Lungs clear to auscultation Musculoskeletal:     Cervical back: Neck supple.  Skin:    General: Skin is warm.     Comments: Erythema with flaky skin noted on anterior portion of neck and both sides.  Neurological:     Mental Status: She is alert and oriented to person, place, and time.  Psychiatric:        Mood and Affect: Mood normal.        Behavior: Behavior normal.        Thought Content: Thought content normal.        Judgment: Judgment normal.     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 Adverse effect of drug, subsequent encounter   3. Rash and other nonspecific skin eruption     Meds ordered this encounter  Medications   EPINEPHrine  0.3 mg/0.3 mL IJ SOAJ injection    Sig: Inject 0.3 mg into the muscle as needed for anaphylaxis.    Dispense:  2 each    Refill:  1   desonide  (DESOWEN ) 0.05 % cream    Sig: Use 1 application sparingly twice a day as needed to area on neck.  Do not use longer than 7 to days in a row    Dispense:  30 g    Refill:  0    Patient Instructions  Food intolerance  - you have previously diagnosed intolerance to gluten with celiac disease - IgE mediated food allergy  testing previously was negative to concerning foods -  IgE to coffee was negative as we do not have this as a skin testing extract.  - should significant symptoms recur or new symptoms occur, a journal is to be kept recording any foods eaten, beverages consumed, medications taken, activities performed, and environmental conditions within a 6 hour time period prior to the onset of symptoms.    Food allergy  -  previous testing to shellfish was positive to crab - we do  not have skin or blood testing for chicory root - continue avoidance of shellfish and chicory root  - have access to self-injectable epinephrine  (Epipen  or AuviQ) 0.3mg  at all times - follow emergency action plan in case of allergic reaction   Rash/possible contact dermatitis -Take photos of the rash on your neck so your dermatologist can see  it at your upcoming appointment this Thursday - Start desonide  0.05% cream using 1 application sparingly twice a day as needed to red itchy areas on the neck.  Do not use longer than 7 to 10 days in a row - Let us  know if the area does not get better - Consider patch testing in the future if needed  Follow-up 1 year or sooner if needed  Return in about 1 year (around 08/24/2024), or if symptoms worsen or fail to improve.    Thank you for the opportunity to care for this patient.  Please do not hesitate to contact me with questions.  Wanda Craze, FNP [facility] 

## 2023-08-26 ENCOUNTER — Other Ambulatory Visit: Payer: Self-pay

## 2023-08-27 ENCOUNTER — Other Ambulatory Visit (HOSPITAL_COMMUNITY): Payer: Self-pay

## 2023-08-27 ENCOUNTER — Ambulatory Visit (INDEPENDENT_AMBULATORY_CARE_PROVIDER_SITE_OTHER)

## 2023-08-27 ENCOUNTER — Ambulatory Visit: Admitting: Podiatry

## 2023-08-27 ENCOUNTER — Encounter: Payer: Self-pay | Admitting: Podiatry

## 2023-08-27 DIAGNOSIS — M2041 Other hammer toe(s) (acquired), right foot: Secondary | ICD-10-CM | POA: Diagnosis not present

## 2023-08-27 DIAGNOSIS — M2011 Hallux valgus (acquired), right foot: Secondary | ICD-10-CM

## 2023-08-27 DIAGNOSIS — Z9889 Other specified postprocedural states: Secondary | ICD-10-CM | POA: Diagnosis not present

## 2023-08-27 DIAGNOSIS — L821 Other seborrheic keratosis: Secondary | ICD-10-CM | POA: Diagnosis not present

## 2023-08-27 DIAGNOSIS — L309 Dermatitis, unspecified: Secondary | ICD-10-CM | POA: Diagnosis not present

## 2023-08-27 MED ORDER — HYDROCORTISONE 2.5 % EX OINT
1.0000 | TOPICAL_OINTMENT | Freq: Two times a day (BID) | CUTANEOUS | 3 refills | Status: AC
  Filled 2023-08-27: qty 28.35, 30d supply, fill #0

## 2023-08-27 NOTE — Progress Notes (Signed)
 She presents today concerned about a weird sensation that she has in her right toe after bunion surgery last year in December as well as hammertoe surgery she states that the toe feels funny is sitting up and has sensations and feels like is trying to lift and curl again.  Objective: Vital signs are stable is alert oriented x 3 I think that she does have odd sensations possibly associated with scar tissue development or the fact that the toe is actually trying to contract at the metatarsal phalangeal joint again.  I do think this is capsular contraction and scar contracture.  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  Plan: I think the best thing to do is try to perform a tenotomy capsulotomy on this we will schedule her back in the later hours of the day and perform this in the near future.

## 2023-09-08 DIAGNOSIS — M25511 Pain in right shoulder: Secondary | ICD-10-CM | POA: Diagnosis not present

## 2023-09-08 DIAGNOSIS — M549 Dorsalgia, unspecified: Secondary | ICD-10-CM | POA: Diagnosis not present

## 2023-09-08 DIAGNOSIS — M797 Fibromyalgia: Secondary | ICD-10-CM | POA: Diagnosis not present

## 2023-09-10 DIAGNOSIS — G4734 Idiopathic sleep related nonobstructive alveolar hypoventilation: Secondary | ICD-10-CM | POA: Diagnosis not present

## 2023-09-10 DIAGNOSIS — G4733 Obstructive sleep apnea (adult) (pediatric): Secondary | ICD-10-CM | POA: Diagnosis not present

## 2023-10-13 ENCOUNTER — Ambulatory Visit (INDEPENDENT_AMBULATORY_CARE_PROVIDER_SITE_OTHER): Admitting: Podiatry

## 2023-10-13 DIAGNOSIS — M2041 Other hammer toe(s) (acquired), right foot: Secondary | ICD-10-CM | POA: Diagnosis not present

## 2023-10-13 NOTE — Progress Notes (Signed)
 She presents today for a tenotomy second digit of the right foot.  States that the toe sits up and rubs the top of my shoe.  She is status post second metatarsal osteotomy.  We did this in December of last year.  Objective: Vital signs are stable alert oriented x 3.  There is no erythema edema cellulitis drainage or odor she has contraction dorsally at the second metatarsal phalangeal joint though minimal.  Assessment contracted second metatarsophalangeal joint.  Plan: After local anesthetic was administered today around the second metatarsal diaphyseal region the foot was prepped and draped in his normal sterile fashion.  Utilizing an 18-gauge needle I was able to transect the long extensor proximal to the metatarsophalangeal joint.  The toe sat rectus.  The area was copiously lavaged normal sterile saline and then closure was made with a Dermabond and a dry sterile compressive dressing with a Darco shoe that she brought with her today follow-up with her in 2 weeks.  She was given care instructions.

## 2023-10-15 ENCOUNTER — Encounter (HOSPITAL_BASED_OUTPATIENT_CLINIC_OR_DEPARTMENT_OTHER): Payer: Self-pay | Admitting: Physical Therapy

## 2023-10-15 ENCOUNTER — Other Ambulatory Visit: Payer: Self-pay

## 2023-10-15 ENCOUNTER — Ambulatory Visit (HOSPITAL_BASED_OUTPATIENT_CLINIC_OR_DEPARTMENT_OTHER): Payer: Self-pay | Attending: Sports Medicine | Admitting: Physical Therapy

## 2023-10-15 DIAGNOSIS — M6281 Muscle weakness (generalized): Secondary | ICD-10-CM | POA: Diagnosis not present

## 2023-10-15 DIAGNOSIS — M25511 Pain in right shoulder: Secondary | ICD-10-CM | POA: Diagnosis not present

## 2023-10-15 NOTE — Therapy (Signed)
 OUTPATIENT PHYSICAL THERAPY UPPER EXTREMITY EVALUATION   Patient Name: Mary [name] MRN: [id number] DOB:[birth year], 68 y.o., female Today's Date: 10/15/2023  END OF SESSION:  PT End of Session - 10/15/23 1005     Visit Number 1    Date for Recertification  12/11/23    Authorization Type HTA Mcr    PT Start Time [phone]    PT Stop Time 1004    PT Time Calculation (min) 33 min    Activity Tolerance Patient tolerated treatment well    Behavior During Therapy [facility] for tasks assessed/performed          Past Medical History:  Diagnosis Date   Anaphylaxis    Chicory root, shellfish   Arthritis    hands, knees, neck - no meds   Bradycardia 01/31/2022   Celiac disease    Depression    Fibromyalgia    GERD (gastroesophageal reflux disease)    no meds   Morbid obesity (HCC) 10/19/2015   NSVT (nonsustained ventricular tachycardia) (HCC) 08/26/2022   PONV (postoperative nausea and vomiting)    SVD (spontaneous vaginal delivery)    x 2   SVT (supraventricular tachycardia) 08/26/2022   Systolic murmur 10/19/2015   Past Surgical History:  Procedure Laterality Date   BILATERAL SALPINGECTOMY Bilateral 03/30/2013   Procedure: BILATERAL SALPINGECTOMY;  Surgeon: Hargis Paradise, MD;  Location: [facility];  Service: Gynecology;  Laterality: Bilateral;   CARDIAC CATHETERIZATION N/A 12/07/2015   Procedure: Left Heart Cath and Coronary Angiography;  Surgeon: Alm LELON Clay, MD;  Location: [facility] INVASIVE CV LAB;  Service: Cardiovascular;  Laterality: N/A;   COLONOSCOPY WITH PROPOFOL  N/A 02/17/2020   Procedure: COLONOSCOPY WITH PROPOFOL ;  Surgeon: Rollin Dover, MD;  Location: [facility];  Service: Endoscopy;  Laterality: N/A;   dermoid cystect     HYSTEROSCOPY WITH D & C  01/28/2012   Procedure: DILATATION AND CURETTAGE /HYSTEROSCOPY;  Surgeon: Hargis Paradise, MD;  Location: [facility];  Service: Gynecology;  Laterality: N/A;   LAPAROSCOPIC ASSISTED VAGINAL HYSTERECTOMY N/A 03/30/2013   Procedure:  LAPAROSCOPIC ASSISTED VAGINAL HYSTERECTOMY;  Surgeon: Hargis Paradise, MD;  Location: [facility];  Service: Gynecology;  Laterality: N/A;   POLYPECTOMY  02/17/2020   Procedure: POLYPECTOMY;  Surgeon: Rollin Dover, MD;  Location: [facility];  Service: Endoscopy;;   REPLACEMENT TOTAL KNEE     bilateral   SPINAL FUSION     C3-C4   WISDOM TOOTH EXTRACTION     Patient Active Problem List   Diagnosis Date Noted   Spondylolisthesis of lumbar region 02/26/2023   Vitamin B12 deficiency 02/26/2023   DDD (degenerative disc disease), lumbar 11/25/2022   Dyslipidemia 11/25/2022   Fibromyalgia 11/25/2022   Food allergy  11/25/2022   Major depression in complete remission 11/25/2022   Nocturnal hypoxia 11/25/2022   Osteopenia 11/25/2022   Type 2 diabetes mellitus with other specified complication (HCC) 11/25/2022   Vitamin D deficiency 11/25/2022   NSVT (nonsustained ventricular tachycardia) (HCC) 08/26/2022   SVT (supraventricular tachycardia) 08/26/2022   Celiac disease 08/21/2022   Colon cancer screening 08/21/2022   Diverticular disease of colon 08/21/2022   Family history of malignant neoplasm of digestive organs 08/21/2022   Gastroesophageal reflux disease 08/21/2022   Lymphocytic colitis 08/21/2022   History of colonic polyps 08/21/2022   Polyp of colon 08/21/2022   Rectal bleeding 08/21/2022   Bradycardia 01/31/2022   Chronic respiratory failure with hypoxia (HCC) 07/02/2020   Osteoarthritis 01/30/2017   Pain in right hand 01/30/2017   OSA (obstructive sleep apnea) 06/25/2016  Abnormal nuclear stress test - Low Risk - Apical Septal Defect 12/04/2015   Hyperlipidemia 10/19/2015   Morbid obesity (HCC) 10/19/2015   Systolic murmur 10/19/2015   Distal interphalangeal nodule 12/13/2014   Fusion of spine of cervical region 12/06/2014   Anaphylaxis 03/02/2014   S/P laparoscopic assisted vaginal hysterectomy (LAVH) 03/30/2013    PCP: Montie Pizza  REFERRING PROVIDER: Aleck Dawn  MD  REFERRING DIAG: M25.511 (ICD-10-CM) - Pain in right shoulder   THERAPY DIAG:  Acute pain of right shoulder  Muscle weakness (generalized)  Rationale for Evaluation and Treatment: Rehabilitation  ONSET DATE: exacerbated 4 months ago  SUBJECTIVE:                                                                                                                                                                                      SUBJECTIVE STATEMENT: Member here: swim and do the machines up front 2 x a week. For years my right shoulder was funny when I was swimming. Over the last few months it has gotten much worse. I got a shot a few months ago and it helped.   Pain seems like a nerve pain. It wakes me up at night no matter what position I am sleeping in. Pain not all the time but at least 50% of day. Can crochet but typing will bother it.  Hand dominance: Right  PERTINENT HISTORY: Fibromyalgia R shoulder impingement  PAIN:  Are you having pain? Yes: NPRS scale: current 5/10; worst 7/10; least 0/10 Pain location: right shoulder  Pain description: ache; radiating Aggravating factors: sleeping all position; otherwise unknown; typing on computer Relieving factors: unknown ignoring  PRECAUTIONS: None  RED FLAGS: None   WEIGHT BEARING RESTRICTIONS: No  FALLS:  Has patient fallen in last 6 months? No  OCCUPATION: retired  PLOF: Independent  PATIENT GOALS: reduce pain, be able to sleep and swim  NEXT MD VISIT: next week Radial Shockwave Therapy Dr Dawn 4 sessions  OBJECTIVE:  Note: Objective measures were completed at Evaluation unless otherwise noted.  DIAGNOSTIC FINDINGS:  none  PATIENT SURVEYS :  Spadi: 28/130=21%  COGNITION: Overall cognitive status: Within functional limits for tasks assessed     SENSATION: WFL  POSTURE: Slightly forward head and shoulders  UPPER EXTREMITY ROM:   Full with slight discomfort at end range shoulder abd and shoulder  flex  UPPER EXTREMITY MMT:  MMT Right eval Left eval  Shoulder flexion 5 5  Shoulder extension 4 4  Shoulder abduction 4 4  Shoulder adduction 5 5  Shoulder internal rotation 5 5  Shoulder external rotation 5 5  Middle trapezius 5 5  Lower trapezius 5 5  Elbow flexion    Elbow extension    Wrist flexion    Wrist extension    Wrist ulnar deviation    Wrist radial deviation    Wrist pronation    Wrist supination    Grip strength (lbs)    (Blank rows = not tested)  SHOULDER SPECIAL TESTS: Impingement tests:  Hawkins/Kennedy : positive  Painful arc test: negative Yocum's test: positive at 90d and > emptycan: neg   PALPATION:  TTP rot cuff insertions, supraspinatus tendon; supraspinatus; anterior deltoid                                                                                                                             TREATMENT  Eval Self care:Posture and body mechanic instruction; Sleeping positions; ice pack usage    PATIENT EDUCATION: Education details: Discussed eval findings, rehab rationale, aquatic program progression/POC and pools in area. Patient is in agreement  Person educated: Patient Education method: Explanation Education comprehension: verbalized understanding  HOME EXERCISE PROGRAM: TBA  ASSESSMENT:  CLINICAL IMPRESSION: Patient is a 68 y.o. f who was seen today for physical therapy evaluation and treatment for right shoulder pain. Patient presents with pain limited deficits in right shoulder with activity tolerance and UE function with ADL's completion. She has TTP throughout shoulder area especially supraspinatus tendon; insertion of all rotator cuff, bursa and scapula. Her ROM is WFL with some minor discomfort at end ranges of shoulder abd and flex. Strength limitation with shoulder abd and extension. She is scheduled to begin the 1st of 4 shockwave treatments next week.  She has curbed her normal exercise regimen due to limiting pain.   She is a good candidate for skilled PT intervention and will benefit optimally by both land and aquatic settings to improve shoulder to full ROM without pain, improve strength and reduce pain to return to PLOF      OBJECTIVE IMPAIRMENTS: decreased activity tolerance, impaired UE functional use, and pain.   ACTIVITY LIMITATIONS: carrying, dressing, and reach over head  PARTICIPATION LIMITATIONS: meal prep, cleaning, and yard work  [facility] POTENTIAL: Good  CLINICAL DECISION MAKING: Stable/uncomplicated  EVALUATION COMPLEXITY: Low  GOALS: Goals reviewed with patient? Yes  SHORT TERM GOALS: Target date: 10/15  Pt will be indep with initial HEP Baseline: Goal status: INITIAL  2.  Pt will report a reduction in right shoulder pain by 2 NPRS Baseline:  Goal status: INITIAL    LONG TERM GOALS: Target date: 12/11/23  Pt to improve on SPADI by 10 % to demonstrate statistically significant Improvement in function. (10-20pts clinical significant improvement) Baseline: Spadi: 28/130=21% Goal status: INITIAL  2.  Return to swimming without pain in right shoulder Baseline:  Goal status: INITIAL  3.  Pt will report decrease in pain by at least 50% for improved toleration to activity/quality of life and to demonstrate improved management of pain. Baseline: see chart Goal status: INITIAL  4.  Pt will return to full right  shoulder ROM with pain Baseline: pain end range Goal status: INITIAL  5.  Pt will improve in right shoulder strength abd and ext to 5/5 Baseline: see chart Goal status: INITIAL  6.  Pt will be indep with final HEP's (land and aquatic as appropriate) for continued management of condition Baseline:  Goal status: INITIAL  PLAN: PT FREQUENCY: 1-2x/week  PT DURATION: 8 weeksalternating aquatics and land likely 12 visits  PLANNED INTERVENTIONS: 97164- PT Re-evaluation, 97750- Physical Performance Testing, 97110-Therapeutic exercises, 97530- Therapeutic activity,  [id number]- Neuromuscular re-education, 97535- Self Care, [id number]- Manual therapy, [phone]- Gait training, [phone]- Aquatic Therapy, [phone]- Electrical stimulation (unattended), [phone]- Ionotophoresis 4mg /ml Dexamethasone , 79439 (1-2 muscles), [phone] (3+ muscles)- Dry Needling, Patient/Family education, Balance training, Stair training, Taping, Joint mobilization, DME instructions, Cryotherapy, and Moist heat  PLAN FOR NEXT SESSION: UE strengthening and ROM; pain management   Ronal Foots) Karesha Trzcinski MPT 10/15/23 4:08 PM [facility] [address] [place], [place], [address] Phone: [phone]   Fax:  [phone]

## 2023-10-19 ENCOUNTER — Other Ambulatory Visit (HOSPITAL_COMMUNITY): Payer: Self-pay | Admitting: Sports Medicine

## 2023-10-19 DIAGNOSIS — M25511 Pain in right shoulder: Secondary | ICD-10-CM

## 2023-10-20 ENCOUNTER — Other Ambulatory Visit (HOSPITAL_COMMUNITY): Payer: Self-pay

## 2023-10-21 ENCOUNTER — Ambulatory Visit (HOSPITAL_COMMUNITY)
Admission: RE | Admit: 2023-10-21 | Discharge: 2023-10-21 | Disposition: A | Discharge status: Home / Self Care | Source: Ambulatory Visit | Attending: Sports Medicine | Admitting: Sports Medicine

## 2023-10-21 ENCOUNTER — Other Ambulatory Visit (HOSPITAL_COMMUNITY): Payer: Self-pay

## 2023-10-21 DIAGNOSIS — M67813 Other specified disorders of tendon, right shoulder: Secondary | ICD-10-CM | POA: Diagnosis not present

## 2023-10-21 DIAGNOSIS — M25511 Pain in right shoulder: Secondary | ICD-10-CM | POA: Insufficient documentation

## 2023-10-21 DIAGNOSIS — M75111 Incomplete rotator cuff tear or rupture of right shoulder, not specified as traumatic: Secondary | ICD-10-CM | POA: Diagnosis not present

## 2023-10-21 MED ORDER — OZEMPIC (2 MG/DOSE) 8 MG/3ML ~~LOC~~ SOPN
2.0000 mg | PEN_INJECTOR | SUBCUTANEOUS | 0 refills | Status: DC
  Filled 2023-10-21: qty 3, 28d supply, fill #0

## 2023-10-27 ENCOUNTER — Encounter: Admitting: Podiatry

## 2023-10-29 ENCOUNTER — Ambulatory Visit (HOSPITAL_BASED_OUTPATIENT_CLINIC_OR_DEPARTMENT_OTHER): Payer: Self-pay | Attending: Sports Medicine | Admitting: Physical Therapy

## 2023-10-29 DIAGNOSIS — M6281 Muscle weakness (generalized): Secondary | ICD-10-CM | POA: Insufficient documentation

## 2023-10-29 DIAGNOSIS — M25511 Pain in right shoulder: Secondary | ICD-10-CM | POA: Insufficient documentation

## 2023-10-29 NOTE — Therapy (Signed)
 OUTPATIENT PHYSICAL THERAPY UPPER EXTREMITY TREATMENT   Patient Name: Mary [name] MRN: [id number] DOB:[birth year], 68 y.o., female Today's Date: 10/29/2023  END OF SESSION:  PT End of Session - 10/29/23 1022     Visit Number 2    Date for Recertification  12/11/23    Authorization Type HTA Mcr    PT Start Time 0930    PT Stop Time 1012    PT Time Calculation (min) 42 min    Activity Tolerance Patient tolerated treatment well    Behavior During Therapy [facility] for tasks assessed/performed           Past Medical History:  Diagnosis Date   Anaphylaxis    Chicory root, shellfish   Arthritis    hands, knees, neck - no meds   Bradycardia 01/31/2022   Celiac disease    Depression    Fibromyalgia    GERD (gastroesophageal reflux disease)    no meds   Morbid obesity (HCC) 10/19/2015   NSVT (nonsustained ventricular tachycardia) (HCC) 08/26/2022   PONV (postoperative nausea and vomiting)    SVD (spontaneous vaginal delivery)    x 2   SVT (supraventricular tachycardia) 08/26/2022   Systolic murmur 10/19/2015   Past Surgical History:  Procedure Laterality Date   BILATERAL SALPINGECTOMY Bilateral 03/30/2013   Procedure: BILATERAL SALPINGECTOMY;  Surgeon: Hargis Paradise, MD;  Location: [facility];  Service: Gynecology;  Laterality: Bilateral;   CARDIAC CATHETERIZATION N/A 12/07/2015   Procedure: Left Heart Cath and Coronary Angiography;  Surgeon: Alm LELON Clay, MD;  Location: [facility] INVASIVE CV LAB;  Service: Cardiovascular;  Laterality: N/A;   COLONOSCOPY WITH PROPOFOL  N/A 02/17/2020   Procedure: COLONOSCOPY WITH PROPOFOL ;  Surgeon: Rollin Dover, MD;  Location: [facility];  Service: Endoscopy;  Laterality: N/A;   dermoid cystect     HYSTEROSCOPY WITH D & C  01/28/2012   Procedure: DILATATION AND CURETTAGE /HYSTEROSCOPY;  Surgeon: Hargis Paradise, MD;  Location: [facility];  Service: Gynecology;  Laterality: N/A;   LAPAROSCOPIC ASSISTED VAGINAL HYSTERECTOMY N/A 03/30/2013   Procedure:  LAPAROSCOPIC ASSISTED VAGINAL HYSTERECTOMY;  Surgeon: Hargis Paradise, MD;  Location: [facility];  Service: Gynecology;  Laterality: N/A;   POLYPECTOMY  02/17/2020   Procedure: POLYPECTOMY;  Surgeon: Rollin Dover, MD;  Location: [facility];  Service: Endoscopy;;   REPLACEMENT TOTAL KNEE     bilateral   SPINAL FUSION     C3-C4   WISDOM TOOTH EXTRACTION     Patient Active Problem List   Diagnosis Date Noted   Spondylolisthesis of lumbar region 02/26/2023   Vitamin B12 deficiency 02/26/2023   DDD (degenerative disc disease), lumbar 11/25/2022   Dyslipidemia 11/25/2022   Fibromyalgia 11/25/2022   Food allergy  11/25/2022   Major depression in complete remission 11/25/2022   Nocturnal hypoxia 11/25/2022   Osteopenia 11/25/2022   Type 2 diabetes mellitus with other specified complication (HCC) 11/25/2022   Vitamin D deficiency 11/25/2022   NSVT (nonsustained ventricular tachycardia) (HCC) 08/26/2022   SVT (supraventricular tachycardia) 08/26/2022   Celiac disease 08/21/2022   Colon cancer screening 08/21/2022   Diverticular disease of colon 08/21/2022   Family history of malignant neoplasm of digestive organs 08/21/2022   Gastroesophageal reflux disease 08/21/2022   Lymphocytic colitis 08/21/2022   History of colonic polyps 08/21/2022   Polyp of colon 08/21/2022   Rectal bleeding 08/21/2022   Bradycardia 01/31/2022   Chronic respiratory failure with hypoxia (HCC) 07/02/2020   Osteoarthritis 01/30/2017   Pain in right hand 01/30/2017   OSA (obstructive sleep apnea)  06/25/2016   Abnormal nuclear stress test - Low Risk - Apical Septal Defect 12/04/2015   Hyperlipidemia 10/19/2015   Morbid obesity (HCC) 10/19/2015   Systolic murmur 10/19/2015   Distal interphalangeal nodule 12/13/2014   Fusion of spine of cervical region 12/06/2014   Anaphylaxis 03/02/2014   S/P laparoscopic assisted vaginal hysterectomy (LAVH) 03/30/2013    PCP: Montie Pizza  REFERRING PROVIDER: Aleck Dawn  MD  REFERRING DIAG: M25.511 (ICD-10-CM) - Pain in right shoulder   THERAPY DIAG:  Acute pain of right shoulder  Muscle weakness (generalized)  Rationale for Evaluation and Treatment: Rehabilitation  ONSET DATE: exacerbated 4 months ago  SUBJECTIVE:                                                                                                                                                                                      SUBJECTIVE STATEMENT: Pt reports she did some swimming, free style, yesterday, and had no pain during - but had some in Rt posterior shoulder and tricep area afterwards.  She reports that using computer usually bothers her Rt shoulder as well.    From initial evaluation:  Member here: swim and do the machines up front 2 x a week. For years my right shoulder was funny when I was swimming. Over the last few months it has gotten much worse. I got a shot a few months ago and it helped.   Pain seems like a nerve pain. It wakes me up at night no matter what position I am sleeping in. Pain not all the time but at least 50% of day. Can crochet but typing will bother it.  Hand dominance: Right  PERTINENT HISTORY: Fibromyalgia R shoulder impingement  PAIN:  Are you having pain? no: NPRS scale: current 0/10 Pain location: right shoulder; posterior arm by deltoid tuberosity Pain description: ache; radiating Aggravating factors: sleeping all position; otherwise unknown; typing on computer Relieving factors: unknown ignoring  PRECAUTIONS: None  RED FLAGS: None   WEIGHT BEARING RESTRICTIONS: No  FALLS:  Has patient fallen in last 6 months? No  OCCUPATION: retired  PLOF: Independent  PATIENT GOALS: reduce pain, be able to sleep and swim  NEXT MD VISIT: next week Radial Shockwave Therapy Dr Dawn 4 sessions  OBJECTIVE:  Note: Objective measures were completed at Evaluation unless otherwise noted.  DIAGNOSTIC FINDINGS:  none  PATIENT SURVEYS :  Spadi:  28/130=21%  COGNITION: Overall cognitive status: Within functional limits for tasks assessed     SENSATION: WFL  POSTURE: Slightly forward head and shoulders  UPPER EXTREMITY ROM:   Full with slight discomfort at end range shoulder abd and shoulder flex  UPPER EXTREMITY  MMT:  MMT Right eval Left eval  Shoulder flexion 5 5  Shoulder extension 4 4  Shoulder abduction 4 4  Shoulder adduction 5 5  Shoulder internal rotation 5 5  Shoulder external rotation 5 5  Middle trapezius 5 5  Lower trapezius 5 5  Elbow flexion    Elbow extension    Wrist flexion    Wrist extension    Wrist ulnar deviation    Wrist radial deviation    Wrist pronation    Wrist supination    Grip strength (lbs)    (Blank rows = not tested)  SHOULDER SPECIAL TESTS: Impingement tests:  Hawkins/Kennedy : positive  Painful arc test: negative Yocum's test: positive at 90d and > emptycan: neg   PALPATION:  TTP rot cuff insertions, supraspinatus tendon; supraspinatus; anterior deltoid                                                                                                                             TREATMENT  [facility] Adult PT Treatment:                                                DATE: 10/29/23  On deck prior to pool entry-- Therapeutic Exercise: Holding solid noodle -Standing bil shoulder ext x 10 reps,  Standing bil shoulder IR x 10 reps Low doorway pec stretch x 15s each Mid level (unilateral) pec stretch 2 x 15 sec each arm  High level (unilateral) pec stretch 1 x 15 sec each arm  L stretch at lifeguard stand 20s x 2  Aquatic Therapy: Pt seen for aquatic therapy today.  Treatment took place in water 3.5 ft in depth at the [facility] pool. Temp of water was 85 (lap pool).  Pt entered/exited the pool via stairs independently with bil rail. Free style 3/4 length (no pain) Elementary breast stroke in back float 1/4 length Seated on pool noodle, back against wall -> scap  squeeze with arm pulses at side; bil shoulder IR/ER (range to tolerance) - increased pain in Rt should upon completion L stretch at rails in pool    Issue land exercise HEP  PATIENT EDUCATION: Education details: HEP Person educated: Patient Education method: Explanation, demo, hand out Education comprehension: verbalized understanding  HOME EXERCISE PROGRAM: Access Code: [email or web address] URL: [email or web address] Date: 10/29/2023 Prepared by: [facility]  Exercises - Standing Bilateral Shoulder Internal Rotation AAROM with Dowel  - 1 x daily - 7 x weekly - 1 sets - 10 reps - Standing Shoulder Extension with Dowel  - 1 x daily - 7 x weekly - 1 sets - 10 reps - Single Arm Doorway Pec Stretch at 60 Elevation  - 1-2 x daily - 7 x weekly - 2-3 reps - 10-20 hold - Standing Fold Over Stretch at  Counter  - 1-2 x daily - 7 x weekly - 2 reps - 10 seconds hold - Standing Scapular Retraction  - 1 x daily - 7 x weekly - 1 sets - 10 reps - 5 hold  ASSESSMENT:  CLINICAL IMPRESSION: 50% of session was completed on land prior to entry in water.  She tolerated land and water stretches for shoulder well, with no increase in pain. She needed minor cues on form.  She did report increased in Rt arm pain upon completion of resisted IR/ER in water; eased with change in exercise and return to shoulder stretches. Also, pt reported increase in shoulder pain when standing in place doing breast stroke arms (empty can position of bil hands)-- pt advised to avoid the thumb down impingement position at this time.  Upon changing arm position to thumbs up, pain resolved.  Goals are ongoing.   From initial evaluation:  Patient is a 68 y.o. f who was seen today for physical therapy evaluation and treatment for right shoulder pain. Patient presents with pain limited deficits in right shoulder with activity tolerance and UE function with ADL's completion. She has TTP throughout shoulder  area especially supraspinatus tendon; insertion of all rotator cuff, bursa and scapula. Her ROM is WFL with some minor discomfort at end ranges of shoulder abd and flex. Strength limitation with shoulder abd and extension. She is scheduled to begin the 1st of 4 shockwave treatments next week.  She has curbed her normal exercise regimen due to limiting pain.  She is a good candidate for skilled PT intervention and will benefit optimally by both land and aquatic settings to improve shoulder to full ROM without pain, improve strength and reduce pain to return to PLOF      OBJECTIVE IMPAIRMENTS: decreased activity tolerance, impaired UE functional use, and pain.   ACTIVITY LIMITATIONS: carrying, dressing, and reach over head  PARTICIPATION LIMITATIONS: meal prep, cleaning, and yard work  [facility] POTENTIAL: Good  CLINICAL DECISION MAKING: Stable/uncomplicated  EVALUATION COMPLEXITY: Low  GOALS: Goals reviewed with patient? Yes  SHORT TERM GOALS: Target date: 10/15  Pt will be indep with initial HEP Baseline: Goal status: INITIAL  2.  Pt will report a reduction in right shoulder pain by 2 NPRS Baseline:  Goal status: INITIAL    LONG TERM GOALS: Target date: 12/11/23  Pt to improve on SPADI by 10 % to demonstrate statistically significant Improvement in function. (10-20pts clinical significant improvement) Baseline: Spadi: 28/130=21% Goal status: INITIAL  2.  Return to swimming without pain in right shoulder Baseline:  Goal status: INITIAL  3.  Pt will report decrease in pain by at least 50% for improved toleration to activity/quality of life and to demonstrate improved management of pain. Baseline: see chart Goal status: INITIAL  4.  Pt will return to full right shoulder ROM with pain Baseline: pain end range Goal status: INITIAL  5.  Pt will improve in right shoulder strength abd and ext to 5/5 Baseline: see chart Goal status: INITIAL  6.  Pt will be indep with final  HEP's (land and aquatic as appropriate) for continued management of condition Baseline:  Goal status: INITIAL  PLAN: PT FREQUENCY: 1-2x/week  PT DURATION: 8 weeksalternating aquatics and land likely 12 visits  PLANNED INTERVENTIONS: 97164- PT Re-evaluation, 97750- Physical Performance Testing, 97110-Therapeutic exercises, 97530- Therapeutic activity, [id number]- Neuromuscular re-education, 97535- Self Care, 02859- Manual therapy, [id number]- Gait training, [phone]- Aquatic Therapy, [phone]- Electrical stimulation (unattended), 97033- Ionotophoresis 4mg /ml Dexamethasone , 79439 (1-2 muscles), 79438 (  3+ muscles)- Dry Needling, Patient/Family education, Balance training, Stair training, Taping, Joint mobilization, DME instructions, Cryotherapy, and Moist heat  PLAN FOR NEXT SESSION: UE strengthening and ROM; pain management  Delon Aquas, PTA 10/29/23 10:40 AM [facility] [address] [place], [place], [address] Phone: [phone]   Fax:  [phone]

## 2023-11-02 ENCOUNTER — Ambulatory Visit (HOSPITAL_BASED_OUTPATIENT_CLINIC_OR_DEPARTMENT_OTHER): Payer: Self-pay | Admitting: Physical Therapy

## 2023-11-03 ENCOUNTER — Ambulatory Visit: Admitting: Podiatry

## 2023-11-03 ENCOUNTER — Encounter: Payer: Self-pay | Admitting: Podiatry

## 2023-11-03 DIAGNOSIS — M2041 Other hammer toe(s) (acquired), right foot: Secondary | ICD-10-CM

## 2023-11-03 NOTE — Progress Notes (Signed)
 She presents today for follow-up of her tenotomy second toe we did about 2 weeks ago.  She states that he feels a little funny but it did track the toe sitting down and it looks much better.  Objective: Vital signs are stable alert and oriented x 3.  Pulses are palpable.  There is no erythema edema salines drainage or odor the incision sites gone on to heal 100% can even see it.  She still has a rigid flexion contracture at the PIPJ which is due to osteoarthritic change.  She understood that this was only stayed the same and the cyst not her problem that she is having today.  She states that it just feels weird because she is walking on the end of the tire and it is arthritic in the mail.  Otherwise she has no pain and she is very happy with the outcome.  Assessment: Well-healing surgical toe second digit right foot.  Plan: Follow-up with me on an as-needed basis.

## 2023-11-04 ENCOUNTER — Ambulatory Visit (HOSPITAL_BASED_OUTPATIENT_CLINIC_OR_DEPARTMENT_OTHER): Payer: Self-pay | Admitting: Physical Therapy

## 2023-11-05 ENCOUNTER — Other Ambulatory Visit (HOSPITAL_COMMUNITY): Payer: Self-pay

## 2023-11-06 ENCOUNTER — Encounter (HOSPITAL_BASED_OUTPATIENT_CLINIC_OR_DEPARTMENT_OTHER): Payer: Self-pay | Admitting: Physical Therapy

## 2023-11-06 ENCOUNTER — Ambulatory Visit (HOSPITAL_BASED_OUTPATIENT_CLINIC_OR_DEPARTMENT_OTHER): Payer: Self-pay | Admitting: Physical Therapy

## 2023-11-06 ENCOUNTER — Other Ambulatory Visit (HOSPITAL_COMMUNITY): Payer: Self-pay

## 2023-11-06 DIAGNOSIS — M25511 Pain in right shoulder: Secondary | ICD-10-CM

## 2023-11-06 DIAGNOSIS — M6281 Muscle weakness (generalized): Secondary | ICD-10-CM

## 2023-11-06 MED ORDER — FREESTYLE LITE TEST VI STRP
ORAL_STRIP | 3 refills | Status: AC
  Filled 2023-11-06: qty 100, 90d supply, fill #0

## 2023-11-06 NOTE — Therapy (Signed)
 OUTPATIENT PHYSICAL THERAPY UPPER EXTREMITY TREATMENT   Patient Name: Mary [name] MRN: [id number] DOB:Feb 27, 1955, 68 y.o., female Today's Date: 11/06/2023  END OF SESSION:  PT End of Session - 11/06/23 1356     Visit Number 3    Date for Recertification  12/11/23    Authorization Type HTA Mcr    PT Start Time 1346    PT Stop Time 1425    PT Time Calculation (min) 39 min    Activity Tolerance Patient tolerated treatment well    Behavior During Therapy [facility] for tasks assessed/performed           Past Medical History:  Diagnosis Date   Anaphylaxis    Chicory root, shellfish   Arthritis    hands, knees, neck - no meds   Bradycardia 01/31/2022   Celiac disease    Depression    Fibromyalgia    GERD (gastroesophageal reflux disease)    no meds   Morbid obesity (HCC) 10/19/2015   NSVT (nonsustained ventricular tachycardia) (HCC) 08/26/2022   PONV (postoperative nausea and vomiting)    SVD (spontaneous vaginal delivery)    x 2   SVT (supraventricular tachycardia) 08/26/2022   Systolic murmur 10/19/2015   Past Surgical History:  Procedure Laterality Date   BILATERAL SALPINGECTOMY Bilateral 03/30/2013   Procedure: BILATERAL SALPINGECTOMY;  Surgeon: Hargis Paradise, MD;  Location: [facility];  Service: Gynecology;  Laterality: Bilateral;   CARDIAC CATHETERIZATION N/A 12/07/2015   Procedure: Left Heart Cath and Coronary Angiography;  Surgeon: Alm LELON Clay, MD;  Location: [facility] INVASIVE CV LAB;  Service: Cardiovascular;  Laterality: N/A;   COLONOSCOPY WITH PROPOFOL  N/A 02/17/2020   Procedure: COLONOSCOPY WITH PROPOFOL ;  Surgeon: Rollin Dover, MD;  Location: [facility];  Service: Endoscopy;  Laterality: N/A;   dermoid cystect     HYSTEROSCOPY WITH D & C  01/28/2012   Procedure: DILATATION AND CURETTAGE /HYSTEROSCOPY;  Surgeon: Hargis Paradise, MD;  Location: [facility];  Service: Gynecology;  Laterality: N/A;   LAPAROSCOPIC ASSISTED VAGINAL HYSTERECTOMY N/A 03/30/2013   Procedure:  LAPAROSCOPIC ASSISTED VAGINAL HYSTERECTOMY;  Surgeon: Hargis Paradise, MD;  Location: [facility];  Service: Gynecology;  Laterality: N/A;   POLYPECTOMY  02/17/2020   Procedure: POLYPECTOMY;  Surgeon: Rollin Dover, MD;  Location: [facility];  Service: Endoscopy;;   REPLACEMENT TOTAL KNEE     bilateral   SPINAL FUSION     C3-C4   WISDOM TOOTH EXTRACTION     Patient Active Problem List   Diagnosis Date Noted   Spondylolisthesis of lumbar region 02/26/2023   Vitamin B12 deficiency 02/26/2023   DDD (degenerative disc disease), lumbar 11/25/2022   Dyslipidemia 11/25/2022   Fibromyalgia 11/25/2022   Food allergy  11/25/2022   Major depression in complete remission 11/25/2022   Nocturnal hypoxia 11/25/2022   Osteopenia 11/25/2022   Type 2 diabetes mellitus with other specified complication (HCC) 11/25/2022   Vitamin D deficiency 11/25/2022   NSVT (nonsustained ventricular tachycardia) (HCC) 08/26/2022   SVT (supraventricular tachycardia) 08/26/2022   Celiac disease 08/21/2022   Colon cancer screening 08/21/2022   Diverticular disease of colon 08/21/2022   Family history of malignant neoplasm of digestive organs 08/21/2022   Gastroesophageal reflux disease 08/21/2022   Lymphocytic colitis 08/21/2022   History of colonic polyps 08/21/2022   Polyp of colon 08/21/2022   Rectal bleeding 08/21/2022   Bradycardia 01/31/2022   Chronic respiratory failure with hypoxia (HCC) 07/02/2020   Osteoarthritis 01/30/2017   Pain in right hand 01/30/2017   OSA (obstructive sleep apnea)  06/25/2016   Abnormal nuclear stress test - Low Risk - Apical Septal Defect 12/04/2015   Hyperlipidemia 10/19/2015   Morbid obesity (HCC) 10/19/2015   Systolic murmur 10/19/2015   Distal interphalangeal nodule 12/13/2014   Fusion of spine of cervical region 12/06/2014   Anaphylaxis 03/02/2014   S/P laparoscopic assisted vaginal hysterectomy (LAVH) 03/30/2013    PCP: Montie Pizza  REFERRING PROVIDER: Aleck Dawn  MD  REFERRING DIAG: M25.511 (ICD-10-CM) - Pain in right shoulder   THERAPY DIAG:  Acute pain of right shoulder  Muscle weakness (generalized)  Rationale for Evaluation and Treatment: Rehabilitation  ONSET DATE: exacerbated 4 months ago  SUBJECTIVE:                                                                                                                                                                                      SUBJECTIVE STATEMENT: Pt reports she has been performing some of the HEP exercises.  Has not been in the pool this week.  Increased pain in Rt neck, shoulder, and forearm noticed with a small amount of gardening today.     From initial evaluation:  Member here: swim and do the machines up front 2 x a week. For years my right shoulder was funny when I was swimming. Over the last few months it has gotten much worse. I got a shot a few months ago and it helped.   Pain seems like a nerve pain. It wakes me up at night no matter what position I am sleeping in. Pain not all the time but at least 50% of day. Can crochet but typing will bother it.  Hand dominance: Right  PERTINENT HISTORY: Fibromyalgia R shoulder impingement  PAIN:  Are you having pain? yes: NPRS scale: current 2/10 Pain location: right shoulder; posterior arm by deltoid tuberosity, forearm, upper trap area Pain description: ache; radiating Aggravating factors: sleeping all position; otherwise unknown; typing on computer Relieving factors: unknown ignoring  PRECAUTIONS: None  RED FLAGS: None   WEIGHT BEARING RESTRICTIONS: No  FALLS:  Has patient fallen in last 6 months? No  OCCUPATION: retired  PLOF: Independent  PATIENT GOALS: reduce pain, be able to sleep and swim  NEXT MD VISIT: next week Radial Shockwave Therapy Dr Dawn 4 sessions  OBJECTIVE:  Note: Objective measures were completed at Evaluation unless otherwise noted.  DIAGNOSTIC FINDINGS:  none  PATIENT SURVEYS :   Spadi: 28/130=21%  COGNITION: Overall cognitive status: Within functional limits for tasks assessed     SENSATION: WFL  POSTURE: Slightly forward head and shoulders  UPPER EXTREMITY ROM:   Full with slight discomfort at end range shoulder abd and shoulder flex  UPPER EXTREMITY MMT:  MMT Right eval Left eval  Shoulder flexion 5 5  Shoulder extension 4 4  Shoulder abduction 4 4  Shoulder adduction 5 5  Shoulder internal rotation 5 5  Shoulder external rotation 5 5  Middle trapezius 5 5  Lower trapezius 5 5  Elbow flexion    Elbow extension    Wrist flexion    Wrist extension    Wrist ulnar deviation    Wrist radial deviation    Wrist pronation    Wrist supination    Grip strength (lbs)    (Blank rows = not tested)  SHOULDER SPECIAL TESTS: Impingement tests:  Hawkins/Kennedy : positive  Painful arc test: negative Yocum's test: positive at 90d and > emptycan: neg   PALPATION:  TTP rot cuff insertions, supraspinatus tendon; supraspinatus; anterior deltoid                                                                                                                             TREATMENT  [facility] Adult PT Treatment:                                                DATE: 11/06/23 Aquatic Therapy: Pt seen for aquatic therapy today.  Treatment took place in water 3.5 ft in depth at the [facility] pool. Temp of water was 85 (lap pool).  Pt entered/exited the pool via stairs independently with bil rail. * walking forward/ backward with arm swing * side stepping with arm add/abdct -> with rainbow hand floats * bow and arrow with step back x 8 each side, UE on rainbow hand floats, cues for technique  * open book x 8 each side, cues to relax shoulder down * staggered stance with bil horiz abdct/ add with UE On rainbow hand floats x 8-10 each LE forward  * RUE nerve flossing  * elementary back stroke  * self care: pt instructed on self massage with ball to pec  and periscapular musculature to decrease fascial tightness  [facility] Adult PT Treatment:                                                DATE: 10/29/23  On deck prior to pool entry-- Therapeutic Exercise: Holding solid noodle -Standing bil shoulder ext x 10 reps,  Standing bil shoulder IR x 10 reps Low doorway pec stretch x 15s each Mid level (unilateral) pec stretch 2 x 15 sec each arm  High level (unilateral) pec stretch 1 x 15 sec each arm  L stretch at lifeguard stand 20s x 2  Aquatic Therapy: Pt seen for aquatic therapy today.  Treatment took place in water 3.5 ft in  depth at the [facility] pool. Temp of water was 85 (lap pool).  Pt entered/exited the pool via stairs independently with bil rail. Free style 3/4 length (no pain) Elementary breast stroke in back float 1/4 length Seated on pool noodle, back against wall -> scap squeeze with arm pulses at side; bil shoulder IR/ER (range to tolerance) - increased pain in Rt should upon completion L stretch at rails in pool    Issue land exercise HEP  PATIENT EDUCATION: Education details: HEP, exercise progression/ modification  Person educated: Patient Education method: Explanation, demo, Education comprehension: verbalized understanding  HOME EXERCISE PROGRAM: Access Code: [email or web address] URL: [email or web address] Date: 10/29/2023 Prepared by: [facility]  Exercises - Standing Bilateral Shoulder Internal Rotation AAROM with Dowel  - 1 x daily - 7 x weekly - 1 sets - 10 reps - Standing Shoulder Extension with Dowel  - 1 x daily - 7 x weekly - 1 sets - 10 reps - Single Arm Doorway Pec Stretch at 60 Elevation  - 1-2 x daily - 7 x weekly - 2-3 reps - 10-20 hold - Standing Fold Over Stretch at Counter  - 1-2 x daily - 7 x weekly - 2 reps - 10 seconds hold - Standing Scapular Retraction  - 1 x daily - 7 x weekly - 1 sets - 10 reps - 5 hold  ASSESSMENT:  CLINICAL IMPRESSION: Pt reported  increased Rt radicular symptoms ( back of Rt upper arm, Rt wrist extensors) with bow and arrow with Rt cervical rotation; reduced/resolved with change in exercise. Pt demonstrated decreased flexibility in Rt pec muscle compared to LUE. Pain waxed and waned throughout session but remained low.  Pt may benefit from some manual therapy to R scalenes and pec minor.   Goals are ongoing.   From initial evaluation:  Patient is a 68 y.o. f who was seen today for physical therapy evaluation and treatment for right shoulder pain. Patient presents with pain limited deficits in right shoulder with activity tolerance and UE function with ADL's completion. She has TTP throughout shoulder area especially supraspinatus tendon; insertion of all rotator cuff, bursa and scapula. Her ROM is WFL with some minor discomfort at end ranges of shoulder abd and flex. Strength limitation with shoulder abd and extension. She is scheduled to begin the 1st of 4 shockwave treatments next week.  She has curbed her normal exercise regimen due to limiting pain.  She is a good candidate for skilled PT intervention and will benefit optimally by both land and aquatic settings to improve shoulder to full ROM without pain, improve strength and reduce pain to return to PLOF      OBJECTIVE IMPAIRMENTS: decreased activity tolerance, impaired UE functional use, and pain.   ACTIVITY LIMITATIONS: carrying, dressing, and reach over head  PARTICIPATION LIMITATIONS: meal prep, cleaning, and yard work  [facility] POTENTIAL: Good  CLINICAL DECISION MAKING: Stable/uncomplicated  EVALUATION COMPLEXITY: Low  GOALS: Goals reviewed with patient? Yes  SHORT TERM GOALS: Target date: 10/15  Pt will be indep with initial HEP Baseline: Goal status: INITIAL  2.  Pt will report a reduction in right shoulder pain by 2 NPRS Baseline:  Goal status: INITIAL    LONG TERM GOALS: Target date: 12/11/23  Pt to improve on SPADI by 10 % to demonstrate  statistically significant Improvement in function. (10-20pts clinical significant improvement) Baseline: Spadi: 28/130=21% Goal status: INITIAL  2.  Return to swimming without pain in right shoulder Baseline:  Goal  status: INITIAL  3.  Pt will report decrease in pain by at least 50% for improved toleration to activity/quality of life and to demonstrate improved management of pain. Baseline: see chart Goal status: INITIAL  4.  Pt will return to full right shoulder ROM with pain Baseline: pain end range Goal status: INITIAL  5.  Pt will improve in right shoulder strength abd and ext to 5/5 Baseline: see chart Goal status: INITIAL  6.  Pt will be indep with final HEP's (land and aquatic as appropriate) for continued management of condition Baseline:  Goal status: INITIAL  PLAN: PT FREQUENCY: 1-2x/week  PT DURATION: 8 weeksalternating aquatics and land likely 12 visits  PLANNED INTERVENTIONS: [id number]- PT Re-evaluation, 97750- Physical Performance Testing, 97110-Therapeutic exercises, 97530- Therapeutic activity, [id number]- Neuromuscular re-education, 97535- Self Care, [id number]- Manual therapy, [id number]- Gait training, [phone]- Aquatic Therapy, [phone]- Electrical stimulation (unattended), [phone]- Ionotophoresis 4mg /ml Dexamethasone , 79439 (1-2 muscles), [id number] (3+ muscles)- Dry Needling, Patient/Family education, Balance training, Stair training, Taping, Joint mobilization, DME instructions, Cryotherapy, and Moist heat  PLAN FOR NEXT SESSION: UE strengthening and ROM; pain management; assess response to initial HEP.   Delon Aquas, PTA 11/06/23 2:26 PM [facility] [address] [place], [place], [address] Phone: [phone]   Fax:  [phone]

## 2023-11-09 NOTE — Therapy (Unsigned)
 OUTPATIENT PHYSICAL THERAPY UPPER EXTREMITY TREATMENT   Patient Name: Mary [name] MRN: [id number] DOB:November 04, 1955, 68 y.o., female Today's Date: 11/10/2023  END OF SESSION:  PT End of Session - 11/10/23 1423     Visit Number 4    Date for Recertification  12/11/23    Authorization Type HTA Mcr    PT Start Time 1320    PT Stop Time 1405    PT Time Calculation (min) 45 min    Activity Tolerance Patient tolerated treatment well    Behavior During Therapy [facility] for tasks assessed/performed            Past Medical History:  Diagnosis Date   Anaphylaxis    Chicory root, shellfish   Arthritis    hands, knees, neck - no meds   Bradycardia 01/31/2022   Celiac disease    Depression    Fibromyalgia    GERD (gastroesophageal reflux disease)    no meds   Morbid obesity (HCC) 10/19/2015   NSVT (nonsustained ventricular tachycardia) (HCC) 08/26/2022   PONV (postoperative nausea and vomiting)    SVD (spontaneous vaginal delivery)    x 2   SVT (supraventricular tachycardia) 08/26/2022   Systolic murmur 10/19/2015   Past Surgical History:  Procedure Laterality Date   BILATERAL SALPINGECTOMY Bilateral 03/30/2013   Procedure: BILATERAL SALPINGECTOMY;  Surgeon: Hargis Paradise, MD;  Location: [facility];  Service: Gynecology;  Laterality: Bilateral;   CARDIAC CATHETERIZATION N/A 12/07/2015   Procedure: Left Heart Cath and Coronary Angiography;  Surgeon: Alm LELON Clay, MD;  Location: [facility] INVASIVE CV LAB;  Service: Cardiovascular;  Laterality: N/A;   COLONOSCOPY WITH PROPOFOL  N/A 02/17/2020   Procedure: COLONOSCOPY WITH PROPOFOL ;  Surgeon: Rollin Dover, MD;  Location: [facility];  Service: Endoscopy;  Laterality: N/A;   dermoid cystect     HYSTEROSCOPY WITH D & C  01/28/2012   Procedure: DILATATION AND CURETTAGE /HYSTEROSCOPY;  Surgeon: Hargis Paradise, MD;  Location: [facility];  Service: Gynecology;  Laterality: N/A;   LAPAROSCOPIC ASSISTED VAGINAL HYSTERECTOMY N/A 03/30/2013   Procedure:  LAPAROSCOPIC ASSISTED VAGINAL HYSTERECTOMY;  Surgeon: Hargis Paradise, MD;  Location: [facility];  Service: Gynecology;  Laterality: N/A;   POLYPECTOMY  02/17/2020   Procedure: POLYPECTOMY;  Surgeon: Rollin Dover, MD;  Location: [facility];  Service: Endoscopy;;   REPLACEMENT TOTAL KNEE     bilateral   SPINAL FUSION     C3-C4   WISDOM TOOTH EXTRACTION     Patient Active Problem List   Diagnosis Date Noted   Spondylolisthesis of lumbar region 02/26/2023   Vitamin B12 deficiency 02/26/2023   DDD (degenerative disc disease), lumbar 11/25/2022   Dyslipidemia 11/25/2022   Fibromyalgia 11/25/2022   Food allergy  11/25/2022   Major depression in complete remission 11/25/2022   Nocturnal hypoxia 11/25/2022   Osteopenia 11/25/2022   Type 2 diabetes mellitus with other specified complication (HCC) 11/25/2022   Vitamin D deficiency 11/25/2022   NSVT (nonsustained ventricular tachycardia) (HCC) 08/26/2022   SVT (supraventricular tachycardia) 08/26/2022   Celiac disease 08/21/2022   Colon cancer screening 08/21/2022   Diverticular disease of colon 08/21/2022   Family history of malignant neoplasm of digestive organs 08/21/2022   Gastroesophageal reflux disease 08/21/2022   Lymphocytic colitis 08/21/2022   History of colonic polyps 08/21/2022   Polyp of colon 08/21/2022   Rectal bleeding 08/21/2022   Bradycardia 01/31/2022   Chronic respiratory failure with hypoxia (HCC) 07/02/2020   Osteoarthritis 01/30/2017   Pain in right hand 01/30/2017   OSA (obstructive sleep  apnea) 06/25/2016   Abnormal nuclear stress test - Low Risk - Apical Septal Defect 12/04/2015   Hyperlipidemia 10/19/2015   Morbid obesity (HCC) 10/19/2015   Systolic murmur 10/19/2015   Distal interphalangeal nodule 12/13/2014   Fusion of spine of cervical region 12/06/2014   Anaphylaxis 03/02/2014   S/P laparoscopic assisted vaginal hysterectomy (LAVH) 03/30/2013    PCP: Montie Pizza  REFERRING PROVIDER: Aleck Dawn  MD  REFERRING DIAG: M25.511 (ICD-10-CM) - Pain in right shoulder   THERAPY DIAG:  Acute pain of right shoulder  Muscle weakness (generalized)  Rationale for Evaluation and Treatment: Rehabilitation  ONSET DATE: exacerbated 4 months ago  SUBJECTIVE:                                                                                                                                                                                      SUBJECTIVE STATEMENT: I feel good, I have some pain here and there and it is never consistent.    From initial evaluation:  Member here: swim and do the machines up front 2 x a week. For years my right shoulder was funny when I was swimming. Over the last few months it has gotten much worse. I got a shot a few months ago and it helped.   Pain seems like a nerve pain. It wakes me up at night no matter what position I am sleeping in. Pain not all the time but at least 50% of day. Can crochet but typing will bother it.  Hand dominance: Right  PERTINENT HISTORY: Fibromyalgia R shoulder impingement  PAIN:  Are you having pain? yes: NPRS scale: current 2/10 Pain location: right shoulder; posterior arm by deltoid tuberosity, forearm, upper trap area Pain description: ache; radiating Aggravating factors: sleeping all position; otherwise unknown; typing on computer Relieving factors: unknown ignoring  PRECAUTIONS: None  RED FLAGS: None   WEIGHT BEARING RESTRICTIONS: No  FALLS:  Has patient fallen in last 6 months? No  OCCUPATION: retired  PLOF: Independent  PATIENT GOALS: reduce pain, be able to sleep and swim  NEXT MD VISIT: next week Radial Shockwave Therapy Dr Dawn 4 sessions  OBJECTIVE:  Note: Objective measures were completed at Evaluation unless otherwise noted.  DIAGNOSTIC FINDINGS:  none  PATIENT SURVEYS :  Spadi: 28/130=21%  COGNITION: Overall cognitive status: Within functional limits for tasks  assessed     SENSATION: WFL  POSTURE: Slightly forward head and shoulders  UPPER EXTREMITY ROM:   Full with slight discomfort at end range shoulder abd and shoulder flex  UPPER EXTREMITY MMT:  MMT Right eval Left eval  Shoulder flexion 5 5  Shoulder extension 4 4  Shoulder  abduction 4 4  Shoulder adduction 5 5  Shoulder internal rotation 5 5  Shoulder external rotation 5 5  Middle trapezius 5 5  Lower trapezius 5 5  Elbow flexion    Elbow extension    Wrist flexion    Wrist extension    Wrist ulnar deviation    Wrist radial deviation    Wrist pronation    Wrist supination    Grip strength (lbs)    (Blank rows = not tested)  SHOULDER SPECIAL TESTS: Impingement tests:  Hawkins/Kennedy : positive  Painful arc test: negative Yocum's test: positive at 90d and > emptycan: neg   PALPATION:  TTP rot cuff insertions, supraspinatus tendon; supraspinatus; anterior deltoid                                                                                                                             TREATMENT  [facility] Adult PT Treatment:                                                DATE: 11/06/23 Aquatic Therapy: Pt seen for aquatic therapy today.  Treatment took place in water 3.5 ft in depth at the [facility] pool. Temp of water was 85 (lap pool).  Pt entered/exited the pool via stairs independently with bil rail. * walking forward/ backward with arm swing * side stepping with arm add/abdct -> with rainbow hand floats * bow and arrow with step back x 8 each side, UE on rainbow hand floats, cues for technique  * open book x 8 each side, cues to relax shoulder down * staggered stance with bil horiz abdct/ add with UE On rainbow hand floats x 8-10 each LE forward  * RUE nerve flossing  * elementary back stroke  * self care: pt instructed on self massage with ball to pec and periscapular musculature to decrease fascial tightness  [facility] Adult PT Treatment:                                                  11/10/23 Trigger Point Dry Needling  Initial Treatment: Pt instructed on Dry Needling rational, procedures, and possible side effects. Pt instructed to expect mild to moderate muscle soreness later in the day and/or into the next day.  Pt instructed in methods to reduce muscle soreness. Pt instructed to continue prescribed HEP. Because Dry Needling was performed over or adjacent to a lung field, pt was educated on S/S of pneumothorax and to seek immediate medical attention should they occur.  Patient was educated on signs and symptoms of infection and other risk factors and advised to seek medical attention should they occur.   Patient  Verbal Consent Given: Yes Education Handout Provided: Yes Muscles Treated: Sub scap  Electrical Stimulation Performed: No Treatment Response/Outcome: Decreased pain with test/ retest with open books  - open books - STM to rhomboids, subscap and UT on R side.  - scap retraction  - education on anatomy and assessment of OH reaching and movement.  DATE: 10/29/23  On deck prior to pool entry-- Therapeutic Exercise: Holding solid noodle -Standing bil shoulder ext x 10 reps,  Standing bil shoulder IR x 10 reps Low doorway pec stretch x 15s each Mid level (unilateral) pec stretch 2 x 15 sec each arm  High level (unilateral) pec stretch 1 x 15 sec each arm  L stretch at lifeguard stand 20s x 2  Aquatic Therapy: Pt seen for aquatic therapy today.  Treatment took place in water 3.5 ft in depth at the [facility] pool. Temp of water was 85 (lap pool).  Pt entered/exited the pool via stairs independently with bil rail. Free style 3/4 length (no pain) Elementary breast stroke in back float 1/4 length Seated on pool noodle, back against wall -> scap squeeze with arm pulses at side; bil shoulder IR/ER (range to tolerance) - increased pain in Rt should upon completion L stretch at rails in pool    Issue land exercise  HEP  PATIENT EDUCATION: Education details: HEP, exercise progression/ modification  Person educated: Patient Education method: Explanation, demo, Education comprehension: verbalized understanding  HOME EXERCISE PROGRAM: Access Code: [email or web address] URL: [email or web address] Date: 10/29/2023 Prepared by: [facility] - Outpatient Rehab - [address]  Exercises - Standing Bilateral Shoulder Internal Rotation AAROM with Dowel  - 1 x daily - 7 x weekly - 1 sets - 10 reps - Standing Shoulder Extension with Dowel  - 1 x daily - 7 x weekly - 1 sets - 10 reps - Single Arm Doorway Pec Stretch at 60 Elevation  - 1-2 x daily - 7 x weekly - 2-3 reps - 10-20 hold - Standing Fold Over Stretch at Counter  - 1-2 x daily - 7 x weekly - 2 reps - 10 seconds hold - Standing Scapular Retraction  - 1 x daily - 7 x weekly - 1 sets - 10 reps - 5 hold  ASSESSMENT:  CLINICAL IMPRESSION: Pt tolerated session well. Pt responded well during and after dry needling reporting an ache in the location of the needle but no other symptoms. She had no adverse effects or bleeding noted. Pt educated on scapular movements and limited mobility of hers. She has trigger points in her subscap. She would benefit from continued scap re-education and strengthening of RTC muscles. Pt encouraged to use heat today. Provided new HEP for land exercises. Pt will continue to benefit from skilled PT to address continued deficits.    From initial evaluation:  Patient is a 67 y.o. f who was seen today for physical therapy evaluation and treatment for right shoulder pain. Patient presents with pain limited deficits in right shoulder with activity tolerance and UE function with ADL's completion. She has TTP throughout shoulder area especially supraspinatus tendon; insertion of all rotator cuff, bursa and scapula. Her ROM is WFL with some minor discomfort at end ranges of shoulder abd and flex. Strength limitation with shoulder abd and  extension. She is scheduled to begin the 1st of 4 shockwave treatments next week.  She has curbed her normal exercise regimen due to limiting pain.  She is a good candidate for skilled PT intervention and will benefit optimally by both  land and aquatic settings to improve shoulder to full ROM without pain, improve strength and reduce pain to return to PLOF      OBJECTIVE IMPAIRMENTS: decreased activity tolerance, impaired UE functional use, and pain.   ACTIVITY LIMITATIONS: carrying, dressing, and reach over head  PARTICIPATION LIMITATIONS: meal prep, cleaning, and yard work  [facility] POTENTIAL: Good  CLINICAL DECISION MAKING: Stable/uncomplicated  EVALUATION COMPLEXITY: Low  GOALS: Goals reviewed with patient? Yes  SHORT TERM GOALS: Target date: 10/15  Pt will be indep with initial HEP Baseline: Goal status: INITIAL  2.  Pt will report a reduction in right shoulder pain by 2 NPRS Baseline:  Goal status: INITIAL    LONG TERM GOALS: Target date: 12/11/23  Pt to improve on SPADI by 10 % to demonstrate statistically significant Improvement in function. (10-20pts clinical significant improvement) Baseline: Spadi: 28/130=21% Goal status: INITIAL  2.  Return to swimming without pain in right shoulder Baseline:  Goal status: INITIAL  3.  Pt will report decrease in pain by at least 50% for improved toleration to activity/quality of life and to demonstrate improved management of pain. Baseline: see chart Goal status: INITIAL  4.  Pt will return to full right shoulder ROM with pain Baseline: pain end range Goal status: INITIAL  5.  Pt will improve in right shoulder strength abd and ext to 5/5 Baseline: see chart Goal status: INITIAL  6.  Pt will be indep with final HEP's (land and aquatic as appropriate) for continued management of condition Baseline:  Goal status: INITIAL  PLAN: PT FREQUENCY: 1-2x/week  PT DURATION: 8 weeksalternating aquatics and land likely 12  visits  PLANNED INTERVENTIONS: [id number]- PT Re-evaluation, 97750- Physical Performance Testing, 97110-Therapeutic exercises, 97530- Therapeutic activity, [id number]- Neuromuscular re-education, 97535- Self Care, [id number]- Manual therapy, [id number]- Gait training, [phone]- Aquatic Therapy, [phone]- Electrical stimulation (unattended), [phone]- Ionotophoresis 4mg /ml Dexamethasone , 79439 (1-2 muscles), [id number] (3+ muscles)- Dry Needling, Patient/Family education, Balance training, Stair training, Taping, Joint mobilization, DME instructions, Cryotherapy, and Moist heat  PLAN FOR NEXT SESSION: UE strengthening and ROM; pain management; assess response to initial HEP. Add in farmers carry.  Rojean Batten PT, DPT 11/10/23  2:24 PM

## 2023-11-10 ENCOUNTER — Encounter (HOSPITAL_BASED_OUTPATIENT_CLINIC_OR_DEPARTMENT_OTHER): Payer: Self-pay | Admitting: Physical Therapy

## 2023-11-10 ENCOUNTER — Ambulatory Visit (HOSPITAL_BASED_OUTPATIENT_CLINIC_OR_DEPARTMENT_OTHER): Admitting: Physical Therapy

## 2023-11-10 DIAGNOSIS — M6281 Muscle weakness (generalized): Secondary | ICD-10-CM

## 2023-11-10 DIAGNOSIS — M25511 Pain in right shoulder: Secondary | ICD-10-CM | POA: Diagnosis not present

## 2023-11-10 NOTE — Patient Instructions (Addendum)

## 2023-11-12 ENCOUNTER — Encounter (HOSPITAL_BASED_OUTPATIENT_CLINIC_OR_DEPARTMENT_OTHER): Payer: Self-pay | Admitting: Physical Therapy

## 2023-11-12 ENCOUNTER — Ambulatory Visit (HOSPITAL_BASED_OUTPATIENT_CLINIC_OR_DEPARTMENT_OTHER): Admitting: Physical Therapy

## 2023-11-12 ENCOUNTER — Other Ambulatory Visit (HOSPITAL_COMMUNITY): Payer: Self-pay

## 2023-11-12 DIAGNOSIS — M25511 Pain in right shoulder: Secondary | ICD-10-CM

## 2023-11-12 DIAGNOSIS — M6281 Muscle weakness (generalized): Secondary | ICD-10-CM

## 2023-11-12 NOTE — Therapy (Signed)
 OUTPATIENT PHYSICAL THERAPY UPPER EXTREMITY TREATMENT   Patient Name: KARMAN BISWELL MRN: [id number] DOB:1955/12/04, 68 y.o., female Today's Date: 11/12/2023  END OF SESSION:  PT End of Session - 11/12/23 0809     Visit Number 5    Date for Recertification  12/11/23    Authorization Type HTA Mcr    PT Start Time 0800    PT Stop Time 0839    PT Time Calculation (min) 39 min    Behavior During Therapy [facility] for tasks assessed/performed            Past Medical History:  Diagnosis Date   Anaphylaxis    Chicory root, shellfish   Arthritis    hands, knees, neck - no meds   Bradycardia 01/31/2022   Celiac disease    Depression    Fibromyalgia    GERD (gastroesophageal reflux disease)    no meds   Morbid obesity (HCC) 10/19/2015   NSVT (nonsustained ventricular tachycardia) (HCC) 08/26/2022   PONV (postoperative nausea and vomiting)    SVD (spontaneous vaginal delivery)    x 2   SVT (supraventricular tachycardia) 08/26/2022   Systolic murmur 10/19/2015   Past Surgical History:  Procedure Laterality Date   BILATERAL SALPINGECTOMY Bilateral 03/30/2013   Procedure: BILATERAL SALPINGECTOMY;  Surgeon: Hargis Paradise, MD;  Location: [facility];  Service: Gynecology;  Laterality: Bilateral;   CARDIAC CATHETERIZATION N/A 12/07/2015   Procedure: Left Heart Cath and Coronary Angiography;  Surgeon: Alm LELON Clay, MD;  Location: [facility] INVASIVE CV LAB;  Service: Cardiovascular;  Laterality: N/A;   COLONOSCOPY WITH PROPOFOL  N/A 02/17/2020   Procedure: COLONOSCOPY WITH PROPOFOL ;  Surgeon: Rollin Dover, MD;  Location: [facility];  Service: Endoscopy;  Laterality: N/A;   dermoid cystect     HYSTEROSCOPY WITH D & C  01/28/2012   Procedure: DILATATION AND CURETTAGE /HYSTEROSCOPY;  Surgeon: Hargis Paradise, MD;  Location: [facility];  Service: Gynecology;  Laterality: N/A;   LAPAROSCOPIC ASSISTED VAGINAL HYSTERECTOMY N/A 03/30/2013   Procedure: LAPAROSCOPIC ASSISTED VAGINAL HYSTERECTOMY;  Surgeon:  Hargis Paradise, MD;  Location: [facility];  Service: Gynecology;  Laterality: N/A;   POLYPECTOMY  02/17/2020   Procedure: POLYPECTOMY;  Surgeon: Rollin Dover, MD;  Location: [facility];  Service: Endoscopy;;   REPLACEMENT TOTAL KNEE     bilateral   SPINAL FUSION     C3-C4   WISDOM TOOTH EXTRACTION     Patient Active Problem List   Diagnosis Date Noted   Spondylolisthesis of lumbar region 02/26/2023   Vitamin B12 deficiency 02/26/2023   DDD (degenerative disc disease), lumbar 11/25/2022   Dyslipidemia 11/25/2022   Fibromyalgia 11/25/2022   Food allergy  11/25/2022   Major depression in complete remission 11/25/2022   Nocturnal hypoxia 11/25/2022   Osteopenia 11/25/2022   Type 2 diabetes mellitus with other specified complication (HCC) 11/25/2022   Vitamin D deficiency 11/25/2022   NSVT (nonsustained ventricular tachycardia) (HCC) 08/26/2022   SVT (supraventricular tachycardia) 08/26/2022   Celiac disease 08/21/2022   Colon cancer screening 08/21/2022   Diverticular disease of colon 08/21/2022   Family history of malignant neoplasm of digestive organs 08/21/2022   Gastroesophageal reflux disease 08/21/2022   Lymphocytic colitis 08/21/2022   History of colonic polyps 08/21/2022   Polyp of colon 08/21/2022   Rectal bleeding 08/21/2022   Bradycardia 01/31/2022   Chronic respiratory failure with hypoxia (HCC) 07/02/2020   Osteoarthritis 01/30/2017   Pain in right hand 01/30/2017   OSA (obstructive sleep apnea) 06/25/2016   Abnormal nuclear stress test -  Low Risk - Apical Septal Defect 12/04/2015   Hyperlipidemia 10/19/2015   Morbid obesity (HCC) 10/19/2015   Systolic murmur 10/19/2015   Distal interphalangeal nodule 12/13/2014   Fusion of spine of cervical region 12/06/2014   Anaphylaxis 03/02/2014   S/P laparoscopic assisted vaginal hysterectomy (LAVH) 03/30/2013    PCP: Montie Pizza  REFERRING PROVIDER: Aleck Dawn MD  REFERRING DIAG: M25.511 (ICD-10-CM) - Pain in  right shoulder   THERAPY DIAG:  Acute pain of right shoulder  Muscle weakness (generalized)  Rationale for Evaluation and Treatment: Rehabilitation  ONSET DATE: exacerbated 4 months ago  SUBJECTIVE:                                                                                                                                                                                      SUBJECTIVE STATEMENT: Pt reports no soreness after DN.  Improved pain level with sleep.  I woke up with no pain in my (R) shoulder yesterday.  She plans to borrow her son's thera-cane.    From initial evaluation:  Member here: swim and do the machines up front 2 x a week. For years my right shoulder was funny when I was swimming. Over the last few months it has gotten much worse. I got a shot a few months ago and it helped.   Pain seems like a nerve pain. It wakes me up at night no matter what position I am sleeping in. Pain not all the time but at least 50% of day. Can crochet but typing will bother it.  Hand dominance: Right  PERTINENT HISTORY: Fibromyalgia R shoulder impingement  PAIN:  Are you having pain? yes: NPRS scale: current 2/10 Pain location: right shoulder; posterior arm by deltoid tuberosity, forearm, upper trap area Pain description: ache; the pain bounces around  Aggravating factors: sleeping all position; otherwise unknown; typing on computer Relieving factors: unknown ignoring  PRECAUTIONS: None  RED FLAGS: None   WEIGHT BEARING RESTRICTIONS: No  FALLS:  Has patient fallen in last 6 months? No  OCCUPATION: retired  PLOF: Independent  PATIENT GOALS: reduce pain, be able to sleep and swim  NEXT MD VISIT: next week Radial Shockwave Therapy Dr Dawn 4 sessions  OBJECTIVE:  Note: Objective measures were completed at Evaluation unless otherwise noted.  DIAGNOSTIC FINDINGS:  none  PATIENT SURVEYS :  Spadi: 28/130=21%  COGNITION: Overall cognitive status: Within  functional limits for tasks assessed     SENSATION: WFL  POSTURE: Slightly forward head and shoulders  UPPER EXTREMITY ROM:   Full with slight discomfort at end range shoulder abd and shoulder flex  UPPER EXTREMITY MMT:  MMT Right eval Left eval  Shoulder flexion 5 5  Shoulder extension 4 4  Shoulder abduction 4 4  Shoulder adduction 5 5  Shoulder internal rotation 5 5  Shoulder external rotation 5 5  Middle trapezius 5 5  Lower trapezius 5 5  Elbow flexion    Elbow extension    Wrist flexion    Wrist extension    Wrist ulnar deviation    Wrist radial deviation    Wrist pronation    Wrist supination    Grip strength (lbs)    (Blank rows = not tested)  SHOULDER SPECIAL TESTS: Impingement tests:  Hawkins/Kennedy : positive  Painful arc test: negative Yocum's test: positive at 90d and > emptycan: neg   PALPATION:  TTP rot cuff insertions, supraspinatus tendon; supraspinatus; anterior deltoid                                                                                                                             TREATMENT  [facility] Adult PT Treatment:                                                DATE: 11/12/23 Aquatic Therapy: Pt seen for aquatic therapy today.  Treatment took place in water 3.5 ft in depth at the [facility] pool. Temp of water was 85 (lap pool).  Pt entered/exited the pool via stairs independently with bil rail. * suitcase carry - bil rainbow hand floats at side under water -> single yellow hand float-walking forward/ backward * walking with reciprocal row motion with rainbow hand float * bow and arrow with step back x 10 each side, cues for technique  * open book 2x5 each side, cues to relax shoulder down  * pec stretch at wall   * bil shoulder ext holding hollow noodle x 10, bil IR x 5 * side stepping with arm add/abdct -> with rainbow hand floats *side to side pendulum for side stretch and core * Opposite arm leg lift at 4th  stair in pool x 5 each    [facility] Adult PT Treatment:                                                 11/10/23 Trigger Point Dry Needling  Initial Treatment: Pt instructed on Dry Needling rational, procedures, and possible side effects. Pt instructed to expect mild to moderate muscle soreness later in the day and/or into the next day.  Pt instructed in methods to reduce muscle soreness. Pt instructed to continue prescribed HEP. Because Dry Needling was performed over or adjacent to a lung field, pt was educated on S/S of pneumothorax and to seek immediate medical attention should they occur.  Patient was  educated on signs and symptoms of infection and other risk factors and advised to seek medical attention should they occur.   Patient Verbal Consent Given: Yes Education Handout Provided: Yes Muscles Treated: Sub scap  Electrical Stimulation Performed: No Treatment Response/Outcome: Decreased pain with test/ retest with open books  - open books - STM to rhomboids, subscap and UT on R side.  - scap retraction  - education on anatomy and assessment of OH reaching and movement.   PATIENT EDUCATION: Education details: HEP, exercise progression/ modification  Person educated: Patient Education method: Programmer, multimedia, demo, Education comprehension: verbalized understanding  HOME EXERCISE PROGRAM: Land Access Code: [email or web address] URL: [email or web address]   ASSESSMENT:  CLINICAL IMPRESSION: Pt reported positive response to dry needling that was performed last session with reduction in symptoms and improvement in sleep. She reported elimination of pain when in water, until completing Rt head rotation with bow and arrow, and Opposite arm/leg lift at stairs;  resolved with change in exercise and position.  Pt is near meeting max potential in aquatic therapy setting; will have one final aquatic therapy appt to finalize aquatic HEP and complete remainder of  appts on land.  Pt will continue to benefit from skilled PT to address continued deficits. Goals are ongoing.    From initial evaluation:  Patient is a 68 y.o. f who was seen today for physical therapy evaluation and treatment for right shoulder pain. Patient presents with pain limited deficits in right shoulder with activity tolerance and UE function with ADL's completion. She has TTP throughout shoulder area especially supraspinatus tendon; insertion of all rotator cuff, bursa and scapula. Her ROM is WFL with some minor discomfort at end ranges of shoulder abd and flex. Strength limitation with shoulder abd and extension. She is scheduled to begin the 1st of 4 shockwave treatments next week.  She has curbed her normal exercise regimen due to limiting pain.  She is a good candidate for skilled PT intervention and will benefit optimally by both land and aquatic settings to improve shoulder to full ROM without pain, improve strength and reduce pain to return to PLOF      OBJECTIVE IMPAIRMENTS: decreased activity tolerance, impaired UE functional use, and pain.   ACTIVITY LIMITATIONS: carrying, dressing, and reach over head  PARTICIPATION LIMITATIONS: meal prep, cleaning, and yard work  [facility] POTENTIAL: Good  CLINICAL DECISION MAKING: Stable/uncomplicated  EVALUATION COMPLEXITY: Low  GOALS: Goals reviewed with patient? Yes  SHORT TERM GOALS: Target date: 10/15  Pt will be indep with initial HEP Baseline: Goal status: In progress - 11/12/23  2.  Pt will report a reduction in right shoulder pain by 2 NPRS Baseline:  Goal status: In progress - 11/12/23    LONG TERM GOALS: Target date: 12/11/23  Pt to improve on SPADI by 10 % to demonstrate statistically significant Improvement in function. (10-20pts clinical significant improvement) Baseline: Spadi: 28/130=21% Goal status: INITIAL  2.  Return to swimming without pain in right shoulder Baseline:  Goal status: INITIAL  3.  Pt  will report decrease in pain by at least 50% for improved toleration to activity/quality of life and to demonstrate improved management of pain. Baseline: see chart Goal status: INITIAL  4.  Pt will return to full right shoulder ROM with pain Baseline: pain end range Goal status: INITIAL  5.  Pt will improve in right shoulder strength abd and ext to 5/5 Baseline: see chart Goal status: INITIAL  6.  Pt will be indep with  final HEP's (land and aquatic as appropriate) for continued management of condition Baseline:  Goal status: INITIAL  PLAN: PT FREQUENCY: 1-2x/week  PT DURATION: 8 weeksalternating aquatics and land likely 12 visits  PLANNED INTERVENTIONS: [id number]- PT Re-evaluation, 97750- Physical Performance Testing, 97110-Therapeutic exercises, 97530- Therapeutic activity, [id number]- Neuromuscular re-education, 97535- Self Care, [id number]- Manual therapy, [phone]- Gait training, [phone]- Aquatic Therapy, [phone]- Electrical stimulation (unattended), [phone]- Ionotophoresis 4mg /ml Dexamethasone , 79439 (1-2 muscles), 20561 (3+ muscles)- Dry Needling, Patient/Family education, Balance training, Stair training, Taping, Joint mobilization, DME instructions, Cryotherapy, and Moist heat  PLAN FOR NEXT SESSION: UE strengthening and ROM; pain management; assess response to initial HEP. Add in farmers carry.   Delon Aquas, PTA 11/12/23 8:49 AM [facility] [address] [place], [place], [address] Phone: [phone]   Fax:  [phone]

## 2023-11-16 ENCOUNTER — Ambulatory Visit (HOSPITAL_BASED_OUTPATIENT_CLINIC_OR_DEPARTMENT_OTHER): Admitting: Physical Therapy

## 2023-11-16 DIAGNOSIS — M25511 Pain in right shoulder: Secondary | ICD-10-CM | POA: Diagnosis not present

## 2023-11-17 NOTE — Therapy (Unsigned)
 OUTPATIENT PHYSICAL THERAPY UPPER EXTREMITY TREATMENT   Patient Name: Mary [name] MRN: [id number] DOB:12-Mar-1955, 68 y.o., female Today's Date: 11/18/2023  END OF SESSION:  PT End of Session - 11/18/23 1305     Visit Number 6    Date for Recertification  12/11/23    Authorization Type HTA Mcr    PT Start Time 1145    PT Stop Time 1228    PT Time Calculation (min) 43 min    Activity Tolerance Patient tolerated treatment well    Behavior During Therapy [facility] for tasks assessed/performed             Past Medical History:  Diagnosis Date   Anaphylaxis    Chicory root, shellfish   Arthritis    hands, knees, neck - no meds   Bradycardia 01/31/2022   Celiac disease    Depression    Fibromyalgia    GERD (gastroesophageal reflux disease)    no meds   Morbid obesity (HCC) 10/19/2015   NSVT (nonsustained ventricular tachycardia) (HCC) 08/26/2022   PONV (postoperative nausea and vomiting)    SVD (spontaneous vaginal delivery)    x 2   SVT (supraventricular tachycardia) 08/26/2022   Systolic murmur 10/19/2015   Past Surgical History:  Procedure Laterality Date   BILATERAL SALPINGECTOMY Bilateral 03/30/2013   Procedure: BILATERAL SALPINGECTOMY;  Surgeon: Hargis Paradise, MD;  Location: [facility];  Service: Gynecology;  Laterality: Bilateral;   CARDIAC CATHETERIZATION N/A 12/07/2015   Procedure: Left Heart Cath and Coronary Angiography;  Surgeon: Alm LELON Clay, MD;  Location: [facility] INVASIVE CV LAB;  Service: Cardiovascular;  Laterality: N/A;   COLONOSCOPY WITH PROPOFOL  N/A 02/17/2020   Procedure: COLONOSCOPY WITH PROPOFOL ;  Surgeon: Rollin Dover, MD;  Location: [facility];  Service: Endoscopy;  Laterality: N/A;   dermoid cystect     HYSTEROSCOPY WITH D & C  01/28/2012   Procedure: DILATATION AND CURETTAGE /HYSTEROSCOPY;  Surgeon: Hargis Paradise, MD;  Location: [facility];  Service: Gynecology;  Laterality: N/A;   LAPAROSCOPIC ASSISTED VAGINAL HYSTERECTOMY N/A 03/30/2013   Procedure:  LAPAROSCOPIC ASSISTED VAGINAL HYSTERECTOMY;  Surgeon: Hargis Paradise, MD;  Location: [facility];  Service: Gynecology;  Laterality: N/A;   POLYPECTOMY  02/17/2020   Procedure: POLYPECTOMY;  Surgeon: Rollin Dover, MD;  Location: [facility];  Service: Endoscopy;;   REPLACEMENT TOTAL KNEE     bilateral   SPINAL FUSION     C3-C4   WISDOM TOOTH EXTRACTION     Patient Active Problem List   Diagnosis Date Noted   Spondylolisthesis of lumbar region 02/26/2023   Vitamin B12 deficiency 02/26/2023   DDD (degenerative disc disease), lumbar 11/25/2022   Dyslipidemia 11/25/2022   Fibromyalgia 11/25/2022   Food allergy  11/25/2022   Major depression in complete remission 11/25/2022   Nocturnal hypoxia 11/25/2022   Osteopenia 11/25/2022   Type 2 diabetes mellitus with other specified complication (HCC) 11/25/2022   Vitamin D deficiency 11/25/2022   NSVT (nonsustained ventricular tachycardia) (HCC) 08/26/2022   SVT (supraventricular tachycardia) 08/26/2022   Celiac disease 08/21/2022   Colon cancer screening 08/21/2022   Diverticular disease of colon 08/21/2022   Family history of malignant neoplasm of digestive organs 08/21/2022   Gastroesophageal reflux disease 08/21/2022   Lymphocytic colitis 08/21/2022   History of colonic polyps 08/21/2022   Polyp of colon 08/21/2022   Rectal bleeding 08/21/2022   Bradycardia 01/31/2022   Chronic respiratory failure with hypoxia (HCC) 07/02/2020   Osteoarthritis 01/30/2017   Pain in right hand 01/30/2017   OSA (obstructive  sleep apnea) 06/25/2016   Abnormal nuclear stress test - Low Risk - Apical Septal Defect 12/04/2015   Hyperlipidemia 10/19/2015   Morbid obesity (HCC) 10/19/2015   Systolic murmur 10/19/2015   Distal interphalangeal nodule 12/13/2014   Fusion of spine of cervical region 12/06/2014   Anaphylaxis 03/02/2014   S/P laparoscopic assisted vaginal hysterectomy (LAVH) 03/30/2013    PCP: Montie Pizza  REFERRING PROVIDER: Aleck Dawn  MD  REFERRING DIAG: M25.511 (ICD-10-CM) - Pain in right shoulder   THERAPY DIAG:  Acute pain of right shoulder  Muscle weakness (generalized)  Rationale for Evaluation and Treatment: Rehabilitation  ONSET DATE: exacerbated 4 months ago  SUBJECTIVE:                                                                                                                                                                                      SUBJECTIVE STATEMENT: I have been feeling good. I have been using my theracane. I have never felt better since that dry needling.    From initial evaluation:  Member here: swim and do the machines up front 2 x a week. For years my right shoulder was funny when I was swimming. Over the last few months it has gotten much worse. I got a shot a few months ago and it helped.   Pain seems like a nerve pain. It wakes me up at night no matter what position I am sleeping in. Pain not all the time but at least 50% of day. Can crochet but typing will bother it.  Hand dominance: Right  PERTINENT HISTORY: Fibromyalgia R shoulder impingement  PAIN:  Are you having pain? yes: NPRS scale: current 2/10 Pain location: right shoulder; posterior arm by deltoid tuberosity, forearm, upper trap area Pain description: ache; the pain bounces around  Aggravating factors: sleeping all position; otherwise unknown; typing on computer Relieving factors: unknown ignoring  PRECAUTIONS: None  RED FLAGS: None   WEIGHT BEARING RESTRICTIONS: No  FALLS:  Has patient fallen in last 6 months? No  OCCUPATION: retired  PLOF: Independent  PATIENT GOALS: reduce pain, be able to sleep and swim  NEXT MD VISIT: next week Radial Shockwave Therapy Dr Dawn 4 sessions  OBJECTIVE:  Note: Objective measures were completed at Evaluation unless otherwise noted.  DIAGNOSTIC FINDINGS:  none  PATIENT SURVEYS :  Spadi: 28/130=21%  COGNITION: Overall cognitive status: Within functional  limits for tasks assessed     SENSATION: WFL  POSTURE: Slightly forward head and shoulders  UPPER EXTREMITY ROM:   Full with slight discomfort at end range shoulder abd and shoulder flex  UPPER EXTREMITY MMT:  MMT Right eval Left eval  Shoulder  flexion 5 5  Shoulder extension 4 4  Shoulder abduction 4 4  Shoulder adduction 5 5  Shoulder internal rotation 5 5  Shoulder external rotation 5 5  Middle trapezius 5 5  Lower trapezius 5 5  Elbow flexion    Elbow extension    Wrist flexion    Wrist extension    Wrist ulnar deviation    Wrist radial deviation    Wrist pronation    Wrist supination    Grip strength (lbs)    (Blank rows = not tested)  SHOULDER SPECIAL TESTS: Impingement tests:  Hawkins/Kennedy : positive  Painful arc test: negative Yocum's test: positive at 90d and > emptycan: neg   PALPATION:  TTP rot cuff insertions, supraspinatus tendon; supraspinatus; anterior deltoid                                                                                                                             TREATMENT  [facility] Adult PT Treatment:                                                11/18/23:  Subsequent treatment: Pt instructed on Dry Needling rational, procedures, and possible side effects. Pt instructed to expect mild to moderate muscle soreness later in the day and/or into the next day.  Pt instructed in methods to reduce muscle soreness. Pt instructed to continue prescribed HEP. Because Dry Needling was performed over or adjacent to a lung field, pt was educated on S/S of pneumothorax and to seek immediate medical attention should they occur.  Patient was educated on signs and symptoms of infection and other risk factors and advised to seek medical attention should they occur.   Patient Verbal Consent Given: Yes Education Handout Provided: Yes Muscles Treated: Sub scap  Electrical Stimulation Performed: No Treatment Response/Outcome: Decreased pain  with test/ retest with open books  Rows with GTB Seated ER with scap retraction with GTB  Seated scap retraction STM to paraspinals, PA mobs to T10, Muscle assessment, before during and after DN.   DATE: 11/12/23 Aquatic Therapy: Pt seen for aquatic therapy today.  Treatment took place in water 3.5 ft in depth at the [facility] pool. Temp of water was 85 (lap pool).  Pt entered/exited the pool via stairs independently with bil rail. * suitcase carry - bil rainbow hand floats at side under water -> single yellow hand float-walking forward/ backward * walking with reciprocal row motion with rainbow hand float * bow and arrow with step back x 10 each side, cues for technique  * open book 2x5 each side, cues to relax shoulder down  * pec stretch at wall   * bil shoulder ext holding hollow noodle x 10, bil IR x 5 * side stepping with arm add/abdct -> with rainbow hand floats *side  to side pendulum for side stretch and core * Opposite arm leg lift at 4th stair in pool x 5 each    [facility] Adult PT Treatment:                                                 11/10/23 Trigger Point Dry Needling  Initial Treatment: Pt instructed on Dry Needling rational, procedures, and possible side effects. Pt instructed to expect mild to moderate muscle soreness later in the day and/or into the next day.  Pt instructed in methods to reduce muscle soreness. Pt instructed to continue prescribed HEP. Because Dry Needling was performed over or adjacent to a lung field, pt was educated on S/S of pneumothorax and to seek immediate medical attention should they occur.  Patient was educated on signs and symptoms of infection and other risk factors and advised to seek medical attention should they occur.   Patient Verbal Consent Given: Yes Education Handout Provided: Yes Muscles Treated: Sub scap  Electrical Stimulation Performed: No Treatment Response/Outcome: Decreased pain with test/ retest with open  books  - open books - STM to rhomboids, subscap and UT on R side.  - scap retraction  - education on anatomy and assessment of OH reaching and movement.   PATIENT EDUCATION: Education details: HEP, exercise progression/ modification  Person educated: Patient Education method: Programmer, Multimedia, demo, Education comprehension: verbalized understanding  HOME EXERCISE PROGRAM: Land Access Code: [email or web address] URL: [email or web address] [id number]   ASSESSMENT:  CLINICAL IMPRESSION: Pt reported positive response to dry needling that was performed  with reduction in symptoms and improvement in sleep. She requested to perform it again today with no adverse effects noted. She tolerated exercises well with cues for proper form, and fatigue noted. Discussed importance of strengthening periscapular muscles to help assist with rounded posture and active lifestyle with grandkids. She is highly motivated. Pt was provided a theraband and an updated HEP. Pt will continue to benefit from skilled PT to address continued deficits.    From initial evaluation:  Patient is a 68 y.o. f who was seen today for physical therapy evaluation and treatment for right shoulder pain. Patient presents with pain limited deficits in right shoulder with activity tolerance and UE function with ADL's completion. She has TTP throughout shoulder area especially supraspinatus tendon; insertion of all rotator cuff, bursa and scapula. Her ROM is WFL with some minor discomfort at end ranges of shoulder abd and flex. Strength limitation with shoulder abd and extension. She is scheduled to begin the 1st of 4 shockwave treatments next week.  She has curbed her normal exercise regimen due to limiting pain.  She is a good candidate for skilled PT intervention and will benefit optimally by both land and aquatic settings to improve shoulder to full ROM without pain, improve strength and reduce pain to return to  PLOF      OBJECTIVE IMPAIRMENTS: decreased activity tolerance, impaired UE functional use, and pain.   ACTIVITY LIMITATIONS: carrying, dressing, and reach over head  PARTICIPATION LIMITATIONS: meal prep, cleaning, and yard work  [facility] POTENTIAL: Good  CLINICAL DECISION MAKING: Stable/uncomplicated  EVALUATION COMPLEXITY: Low  GOALS: Goals reviewed with patient? Yes  SHORT TERM GOALS: Target date: 10/15  Pt will be indep with initial HEP Baseline: Goal status: In progress - 11/12/23  2.  Pt will report a reduction  in right shoulder pain by 2 NPRS Baseline:  Goal status: In progress - 11/12/23    LONG TERM GOALS: Target date: 12/11/23  Pt to improve on SPADI by 10 % to demonstrate statistically significant Improvement in function. (10-20pts clinical significant improvement) Baseline: Spadi: 28/130=21% Goal status: INITIAL  2.  Return to swimming without pain in right shoulder Baseline:  Goal status: INITIAL  3.  Pt will report decrease in pain by at least 50% for improved toleration to activity/quality of life and to demonstrate improved management of pain. Baseline: see chart Goal status: INITIAL  4.  Pt will return to full right shoulder ROM with pain Baseline: pain end range Goal status: INITIAL  5.  Pt will improve in right shoulder strength abd and ext to 5/5 Baseline: see chart Goal status: INITIAL  6.  Pt will be indep with final HEP's (land and aquatic as appropriate) for continued management of condition Baseline:  Goal status: INITIAL  PLAN: PT FREQUENCY: 1-2x/week  PT DURATION: 8 weeksalternating aquatics and land likely 12 visits  PLANNED INTERVENTIONS: [id number]- PT Re-evaluation, 97750- Physical Performance Testing, 97110-Therapeutic exercises, 97530- Therapeutic activity, [id number]- Neuromuscular re-education, 97535- Self Care, [id number]- Manual therapy, [id number]- Gait training, [phone]- Aquatic Therapy, [phone]- Electrical stimulation (unattended), [phone]-  Ionotophoresis 4mg /ml Dexamethasone , 79439 (1-2 muscles), [id number] (3+ muscles)- Dry Needling, Patient/Family education, Balance training, Stair training, Taping, Joint mobilization, DME instructions, Cryotherapy, and Moist heat  PLAN FOR NEXT SESSION: UE strengthening and ROM; pain management; assess response to initial HEP. Add in farmers carry.   Rojean Batten PT, DPT 11/18/23  1:08 PM

## 2023-11-18 ENCOUNTER — Encounter (HOSPITAL_BASED_OUTPATIENT_CLINIC_OR_DEPARTMENT_OTHER): Payer: Self-pay | Admitting: Physical Therapy

## 2023-11-18 ENCOUNTER — Ambulatory Visit (HOSPITAL_BASED_OUTPATIENT_CLINIC_OR_DEPARTMENT_OTHER): Admitting: Physical Therapy

## 2023-11-18 DIAGNOSIS — M6281 Muscle weakness (generalized): Secondary | ICD-10-CM

## 2023-11-18 DIAGNOSIS — M25511 Pain in right shoulder: Secondary | ICD-10-CM | POA: Diagnosis not present

## 2023-11-23 ENCOUNTER — Encounter (HOSPITAL_BASED_OUTPATIENT_CLINIC_OR_DEPARTMENT_OTHER): Admitting: Physical Therapy

## 2023-11-26 ENCOUNTER — Ambulatory Visit (HOSPITAL_BASED_OUTPATIENT_CLINIC_OR_DEPARTMENT_OTHER): Admitting: Physical Therapy

## 2023-11-29 ENCOUNTER — Other Ambulatory Visit (HOSPITAL_COMMUNITY): Payer: Self-pay

## 2023-11-29 DIAGNOSIS — G4734 Idiopathic sleep related nonobstructive alveolar hypoventilation: Secondary | ICD-10-CM | POA: Diagnosis not present

## 2023-11-30 ENCOUNTER — Encounter (HOSPITAL_BASED_OUTPATIENT_CLINIC_OR_DEPARTMENT_OTHER): Admitting: Physical Therapy

## 2023-12-01 ENCOUNTER — Other Ambulatory Visit (HOSPITAL_COMMUNITY): Payer: Self-pay

## 2023-12-01 MED ORDER — OZEMPIC (2 MG/DOSE) 8 MG/3ML ~~LOC~~ SOPN
2.0000 mg | PEN_INJECTOR | SUBCUTANEOUS | 0 refills | Status: DC
  Filled 2023-12-01: qty 3, 28d supply, fill #0

## 2023-12-03 ENCOUNTER — Ambulatory Visit (HOSPITAL_BASED_OUTPATIENT_CLINIC_OR_DEPARTMENT_OTHER): Admitting: Physical Therapy

## 2023-12-07 ENCOUNTER — Ambulatory Visit (HOSPITAL_BASED_OUTPATIENT_CLINIC_OR_DEPARTMENT_OTHER): Admitting: Physical Therapy

## 2023-12-07 ENCOUNTER — Other Ambulatory Visit: Payer: Self-pay | Admitting: Family Medicine

## 2023-12-07 DIAGNOSIS — Z1231 Encounter for screening mammogram for malignant neoplasm of breast: Secondary | ICD-10-CM

## 2023-12-08 DIAGNOSIS — E119 Type 2 diabetes mellitus without complications: Secondary | ICD-10-CM | POA: Diagnosis not present

## 2023-12-14 DIAGNOSIS — M25511 Pain in right shoulder: Secondary | ICD-10-CM | POA: Diagnosis not present

## 2023-12-28 ENCOUNTER — Encounter: Payer: Self-pay | Admitting: Adult Health

## 2023-12-28 ENCOUNTER — Other Ambulatory Visit (HOSPITAL_COMMUNITY): Payer: Self-pay

## 2023-12-28 ENCOUNTER — Other Ambulatory Visit (HOSPITAL_BASED_OUTPATIENT_CLINIC_OR_DEPARTMENT_OTHER): Payer: Self-pay

## 2023-12-28 ENCOUNTER — Other Ambulatory Visit: Payer: Self-pay

## 2023-12-28 ENCOUNTER — Ambulatory Visit: Payer: Self-pay | Admitting: Adult Health

## 2023-12-28 VITALS — BP 127/78 | HR 77 | Temp 97.6°F | Ht 62.5 in | Wt 218.2 lb

## 2023-12-28 DIAGNOSIS — G4733 Obstructive sleep apnea (adult) (pediatric): Secondary | ICD-10-CM | POA: Diagnosis not present

## 2023-12-28 DIAGNOSIS — J069 Acute upper respiratory infection, unspecified: Secondary | ICD-10-CM | POA: Diagnosis not present

## 2023-12-28 DIAGNOSIS — Z6839 Body mass index (BMI) 39.0-39.9, adult: Secondary | ICD-10-CM

## 2023-12-28 DIAGNOSIS — F322 Major depressive disorder, single episode, severe without psychotic features: Secondary | ICD-10-CM | POA: Diagnosis not present

## 2023-12-28 DIAGNOSIS — M797 Fibromyalgia: Secondary | ICD-10-CM | POA: Diagnosis not present

## 2023-12-28 DIAGNOSIS — F4321 Adjustment disorder with depressed mood: Secondary | ICD-10-CM | POA: Diagnosis not present

## 2023-12-28 DIAGNOSIS — E785 Hyperlipidemia, unspecified: Secondary | ICD-10-CM | POA: Diagnosis not present

## 2023-12-28 DIAGNOSIS — Z23 Encounter for immunization: Secondary | ICD-10-CM | POA: Diagnosis not present

## 2023-12-28 DIAGNOSIS — J9611 Chronic respiratory failure with hypoxia: Secondary | ICD-10-CM | POA: Diagnosis not present

## 2023-12-28 DIAGNOSIS — E1169 Type 2 diabetes mellitus with other specified complication: Secondary | ICD-10-CM | POA: Diagnosis not present

## 2023-12-28 MED ORDER — ATORVASTATIN CALCIUM 20 MG PO TABS
20.0000 mg | ORAL_TABLET | Freq: Every day | ORAL | 3 refills | Status: AC
  Filled 2023-12-28: qty 90, 90d supply, fill #0

## 2023-12-28 MED ORDER — DULOXETINE HCL 60 MG PO CPEP
120.0000 mg | ORAL_CAPSULE | Freq: Every day | ORAL | 1 refills | Status: AC
  Filled 2023-12-28: qty 180, 90d supply, fill #0

## 2023-12-28 MED ORDER — METFORMIN HCL ER 500 MG PO TB24
1000.0000 mg | ORAL_TABLET | Freq: Every evening | ORAL | 1 refills | Status: AC
  Filled 2023-12-28: qty 180, 90d supply, fill #0

## 2023-12-28 MED ORDER — FREESTYLE LITE TEST VI STRP
ORAL_STRIP | 3 refills | Status: AC
  Filled 2023-12-28: qty 100, 90d supply, fill #0

## 2023-12-28 MED ORDER — OZEMPIC (2 MG/DOSE) 8 MG/3ML ~~LOC~~ SOPN
2.0000 mg | PEN_INJECTOR | SUBCUTANEOUS | 0 refills | Status: DC
  Filled 2023-12-28 – 2023-12-30 (×2): qty 3, 28d supply, fill #0

## 2023-12-28 NOTE — Patient Instructions (Addendum)
 Continue on CPAP At bedtime with Oxygen  2l/m .  Order for new CPAP  Work on healthy weight (keep up good work) .  Do not drive if sleepy  Follow with Mary Dahle NP in 1 year and As needed

## 2023-12-28 NOTE — Therapy (Unsigned)
 OUTPATIENT PHYSICAL THERAPY UPPER EXTREMITY TREATMENT   Patient Name: Mary [name] MRN: [id number] DOB:01-14-1956, 68 y.o., female Today's Date: 12/28/2023  END OF SESSION:       Past Medical History:  Diagnosis Date   Anaphylaxis    Chicory root, shellfish   Arthritis    hands, knees, neck - no meds   Bradycardia 01/31/2022   Celiac disease    Depression    Fibromyalgia    GERD (gastroesophageal reflux disease)    no meds   Morbid obesity (HCC) 10/19/2015   NSVT (nonsustained ventricular tachycardia) (HCC) 08/26/2022   PONV (postoperative nausea and vomiting)    SVD (spontaneous vaginal delivery)    x 2   SVT (supraventricular tachycardia) 08/26/2022   Systolic murmur 10/19/2015   Past Surgical History:  Procedure Laterality Date   BILATERAL SALPINGECTOMY Bilateral 03/30/2013   Procedure: BILATERAL SALPINGECTOMY;  Surgeon: Hargis Paradise, MD;  Location: [facility];  Service: Gynecology;  Laterality: Bilateral;   CARDIAC CATHETERIZATION N/A 12/07/2015   Procedure: Left Heart Cath and Coronary Angiography;  Surgeon: Alm LELON Clay, MD;  Location: [facility] INVASIVE CV LAB;  Service: Cardiovascular;  Laterality: N/A;   COLONOSCOPY WITH PROPOFOL  N/A 02/17/2020   Procedure: COLONOSCOPY WITH PROPOFOL ;  Surgeon: Rollin Dover, MD;  Location: [facility];  Service: Endoscopy;  Laterality: N/A;   dermoid cystect     HYSTEROSCOPY WITH D & C  01/28/2012   Procedure: DILATATION AND CURETTAGE /HYSTEROSCOPY;  Surgeon: Hargis Paradise, MD;  Location: [facility];  Service: Gynecology;  Laterality: N/A;   LAPAROSCOPIC ASSISTED VAGINAL HYSTERECTOMY N/A 03/30/2013   Procedure: LAPAROSCOPIC ASSISTED VAGINAL HYSTERECTOMY;  Surgeon: Hargis Paradise, MD;  Location: [facility];  Service: Gynecology;  Laterality: N/A;   POLYPECTOMY  02/17/2020   Procedure: POLYPECTOMY;  Surgeon: Rollin Dover, MD;  Location: [facility];  Service: Endoscopy;;   REPLACEMENT TOTAL KNEE     bilateral   SPINAL FUSION     C3-C4    WISDOM TOOTH EXTRACTION     Patient Active Problem List   Diagnosis Date Noted   Spondylolisthesis of lumbar region 02/26/2023   Vitamin B12 deficiency 02/26/2023   DDD (degenerative disc disease), lumbar 11/25/2022   Dyslipidemia 11/25/2022   Fibromyalgia 11/25/2022   Food allergy  11/25/2022   Major depression in complete remission 11/25/2022   Nocturnal hypoxia 11/25/2022   Osteopenia 11/25/2022   Type 2 diabetes mellitus with other specified complication (HCC) 11/25/2022   Vitamin D deficiency 11/25/2022   NSVT (nonsustained ventricular tachycardia) (HCC) 08/26/2022   SVT (supraventricular tachycardia) 08/26/2022   Celiac disease 08/21/2022   Colon cancer screening 08/21/2022   Diverticular disease of colon 08/21/2022   Family history of malignant neoplasm of digestive organs 08/21/2022   Gastroesophageal reflux disease 08/21/2022   Lymphocytic colitis 08/21/2022   History of colonic polyps 08/21/2022   Polyp of colon 08/21/2022   Rectal bleeding 08/21/2022   Bradycardia 01/31/2022   Chronic respiratory failure with hypoxia (HCC) 07/02/2020   Osteoarthritis 01/30/2017   Pain in right hand 01/30/2017   OSA (obstructive sleep apnea) 06/25/2016   Abnormal nuclear stress test - Low Risk - Apical Septal Defect 12/04/2015   Hyperlipidemia 10/19/2015   Morbid obesity (HCC) 10/19/2015   Systolic murmur 10/19/2015   Distal interphalangeal nodule 12/13/2014   Fusion of spine of cervical region 12/06/2014   Anaphylaxis 03/02/2014   S/P laparoscopic assisted vaginal hysterectomy (LAVH) 03/30/2013    PCP: Montie Pizza  REFERRING PROVIDER: Aleck Dawn MD  REFERRING DIAG: M25.511 (ICD-10-CM) -  Pain in right shoulder   THERAPY DIAG:  No diagnosis found.  Rationale for Evaluation and Treatment: Rehabilitation  ONSET DATE: exacerbated 4 months ago  SUBJECTIVE:                                                                                                                                                                                       SUBJECTIVE STATEMENT: I have been feeling good. I have been using my theracane. I have never felt better since that dry needling.    From initial evaluation:  Member here: swim and do the machines up front 2 x a week. For years my right shoulder was funny when I was swimming. Over the last few months it has gotten much worse. I got a shot a few months ago and it helped.   Pain seems like a nerve pain. It wakes me up at night no matter what position I am sleeping in. Pain not all the time but at least 50% of day. Can crochet but typing will bother it.  Hand dominance: Right  PERTINENT HISTORY: Fibromyalgia R shoulder impingement  PAIN:  Are you having pain? yes: NPRS scale: current 2/10 Pain location: right shoulder; posterior arm by deltoid tuberosity, forearm, upper trap area Pain description: ache; the pain bounces around  Aggravating factors: sleeping all position; otherwise unknown; typing on computer Relieving factors: unknown ignoring  PRECAUTIONS: None  RED FLAGS: None   WEIGHT BEARING RESTRICTIONS: No  FALLS:  Has patient fallen in last 6 months? No  OCCUPATION: retired  PLOF: Independent  PATIENT GOALS: reduce pain, be able to sleep and swim  NEXT MD VISIT: next week Radial Shockwave Therapy Dr Dasie 4 sessions  OBJECTIVE:  Note: Objective measures were completed at Evaluation unless otherwise noted.  DIAGNOSTIC FINDINGS:  none  PATIENT SURVEYS :  Spadi: 28/130=21%  COGNITION: Overall cognitive status: Within functional limits for tasks assessed     SENSATION: WFL  POSTURE: Slightly forward head and shoulders  UPPER EXTREMITY ROM:   Full with slight discomfort at end range shoulder abd and shoulder flex  UPPER EXTREMITY MMT:  MMT Right eval Left eval  Shoulder flexion 5 5  Shoulder extension 4 4  Shoulder abduction 4 4  Shoulder adduction 5 5  Shoulder internal rotation  5 5  Shoulder external rotation 5 5  Middle trapezius 5 5  Lower trapezius 5 5  Elbow flexion    Elbow extension    Wrist flexion    Wrist extension    Wrist ulnar deviation    Wrist radial deviation    Wrist pronation    Wrist supination    Grip  strength (lbs)    (Blank rows = not tested)  SHOULDER SPECIAL TESTS: Impingement tests:  Hawkins/Kennedy : positive  Painful arc test: negative Yocum's test: positive at 90d and > emptycan: neg   PALPATION:  TTP rot cuff insertions, supraspinatus tendon; supraspinatus; anterior deltoid                                                                                                                             TREATMENT  [facility] Adult PT Treatment:                                                11/18/23:  Subsequent treatment: Pt instructed on Dry Needling rational, procedures, and possible side effects. Pt instructed to expect mild to moderate muscle soreness later in the day and/or into the next day.  Pt instructed in methods to reduce muscle soreness. Pt instructed to continue prescribed HEP. Because Dry Needling was performed over or adjacent to a lung field, pt was educated on S/S of pneumothorax and to seek immediate medical attention should they occur.  Patient was educated on signs and symptoms of infection and other risk factors and advised to seek medical attention should they occur.   Patient Verbal Consent Given: Yes Education Handout Provided: Yes Muscles Treated: Sub scap  Electrical Stimulation Performed: No Treatment Response/Outcome: Decreased pain with test/ retest with open books  Rows with GTB Seated ER with scap retraction with GTB  Seated scap retraction STM to paraspinals, PA mobs to T10, Muscle assessment, before during and after DN.   DATE: 11/12/23 Aquatic Therapy: Pt seen for aquatic therapy today.  Treatment took place in water 3.5 ft in depth at the [facility] pool. Temp of water was 85 (lap  pool).  Pt entered/exited the pool via stairs independently with bil rail. * suitcase carry - bil rainbow hand floats at side under water -> single yellow hand float-walking forward/ backward * walking with reciprocal row motion with rainbow hand float * bow and arrow with step back x 10 each side, cues for technique  * open book 2x5 each side, cues to relax shoulder down  * pec stretch at wall   * bil shoulder ext holding hollow noodle x 10, bil IR x 5 * side stepping with arm add/abdct -> with rainbow hand floats *side to side pendulum for side stretch and core * Opposite arm leg lift at 4th stair in pool x 5 each    [facility] Adult PT Treatment:                                                 11/10/23 Trigger Point Dry Needling  Initial Treatment:  Pt instructed on Dry Needling rational, procedures, and possible side effects. Pt instructed to expect mild to moderate muscle soreness later in the day and/or into the next day.  Pt instructed in methods to reduce muscle soreness. Pt instructed to continue prescribed HEP. Because Dry Needling was performed over or adjacent to a lung field, pt was educated on S/S of pneumothorax and to seek immediate medical attention should they occur.  Patient was educated on signs and symptoms of infection and other risk factors and advised to seek medical attention should they occur.   Patient Verbal Consent Given: Yes Education Handout Provided: Yes Muscles Treated: Sub scap  Electrical Stimulation Performed: No Treatment Response/Outcome: Decreased pain with test/ retest with open books  - open books - STM to rhomboids, subscap and UT on R side.  - scap retraction  - education on anatomy and assessment of OH reaching and movement.   PATIENT EDUCATION: Education details: HEP, exercise progression/ modification  Person educated: Patient Education method: Programmer, Multimedia, demo, Education comprehension: verbalized understanding  HOME EXERCISE  PROGRAM: Land Access Code: [email or web address] URL: [email or web address]   ASSESSMENT:  CLINICAL IMPRESSION: Pt reported positive response to dry needling that was performed  with reduction in symptoms and improvement in sleep. She requested to perform it again today with no adverse effects noted. She tolerated exercises well with cues for proper form, and fatigue noted. Discussed importance of strengthening periscapular muscles to help assist with rounded posture and active lifestyle with grandkids. She is highly motivated. Pt was provided a theraband and an updated HEP. Pt will continue to benefit from skilled PT to address continued deficits.    From initial evaluation:  Patient is a 68 y.o. f who was seen today for physical therapy evaluation and treatment for right shoulder pain. Patient presents with pain limited deficits in right shoulder with activity tolerance and UE function with ADL's completion. She has TTP throughout shoulder area especially supraspinatus tendon; insertion of all rotator cuff, bursa and scapula. Her ROM is WFL with some minor discomfort at end ranges of shoulder abd and flex. Strength limitation with shoulder abd and extension. She is scheduled to begin the 1st of 4 shockwave treatments next week.  She has curbed her normal exercise regimen due to limiting pain.  She is a good candidate for skilled PT intervention and will benefit optimally by both land and aquatic settings to improve shoulder to full ROM without pain, improve strength and reduce pain to return to PLOF      OBJECTIVE IMPAIRMENTS: decreased activity tolerance, impaired UE functional use, and pain.   ACTIVITY LIMITATIONS: carrying, dressing, and reach over head  PARTICIPATION LIMITATIONS: meal prep, cleaning, and yard work  [facility] POTENTIAL: Good  CLINICAL DECISION MAKING: Stable/uncomplicated  EVALUATION COMPLEXITY: Low  GOALS: Goals reviewed with patient?  Yes  SHORT TERM GOALS: Target date: 10/15  Pt will be indep with initial HEP Baseline: Goal status: In progress - 11/12/23  2.  Pt will report a reduction in right shoulder pain by 2 NPRS Baseline:  Goal status: In progress - 11/12/23    LONG TERM GOALS: Target date: 12/11/23  Pt to improve on SPADI by 10 % to demonstrate statistically significant Improvement in function. (10-20pts clinical significant improvement) Baseline: Spadi: 28/130=21% Goal status: INITIAL  2.  Return to swimming without pain in right shoulder Baseline:  Goal status: INITIAL  3.  Pt will report decrease in pain by at least 50% for improved toleration to activity/quality  of life and to demonstrate improved management of pain. Baseline: see chart Goal status: INITIAL  4.  Pt will return to full right shoulder ROM with pain Baseline: pain end range Goal status: INITIAL  5.  Pt will improve in right shoulder strength abd and ext to 5/5 Baseline: see chart Goal status: INITIAL  6.  Pt will be indep with final HEP's (land and aquatic as appropriate) for continued management of condition Baseline:  Goal status: INITIAL  PLAN: PT FREQUENCY: 1-2x/week  PT DURATION: 8 weeksalternating aquatics and land likely 12 visits  PLANNED INTERVENTIONS: [id number]- PT Re-evaluation, 97750- Physical Performance Testing, 97110-Therapeutic exercises, 97530- Therapeutic activity, [id number]- Neuromuscular re-education, 97535- Self Care, [id number]- Manual therapy, [id number]- Gait training, [phone]- Aquatic Therapy, [phone]- Electrical stimulation (unattended), [phone]- Ionotophoresis 4mg /ml Dexamethasone , 79439 (1-2 muscles), [id number] (3+ muscles)- Dry Needling, Patient/Family education, Balance training, Stair training, Taping, Joint mobilization, DME instructions, Cryotherapy, and Moist heat  PLAN FOR NEXT SESSION: UE strengthening and ROM; pain management; assess response to initial HEP. Add in farmers carry.   Rojean Batten PT, DPT 12/28/23   7:15 PM

## 2023-12-28 NOTE — Progress Notes (Signed)
 @Patient  ID: Mary [name], female    DOB: 31-Jul-1955, 68 y.o.   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  Basal SpO2 94%, low SpO2 83%.  Spent 8 min with SpO2 < 88%. CPAP titration 05/15/16 >> CPAP 10 cm H2O >> AHI 1.2.  Did not need supplemental oxygen . HST 06/12/16 >> 17.2, SaO2 low 82% CPAP 05/17/17 to 06/15/17 >> used on 30 of 30 nights with average 8 hrs 11 min.  Average AHI 3.6 with CPAP 10 cm H2O  ONO with CPAP and 2 liters 10/23/16 >> test time 3 hrs 25 min.  Average SpO2 92%, low SpO2 89%.     Cardiac tests Echo 11/06/15 >> EF 55 to 60%, grade 2 DD  Discussed the use of AI scribe software for clinical note transcription with the patient, who gave verbal consent to proceed.  History of Present Illness Mary [name] is a 68 year old female with obstructive sleep apnea and OHS who presents for a follow-up regarding her sleep apnea on CPAP therapy.  Her CPAP therapy is going well with excellent compliance and effectiveness.  She is on CPAP at bedtime with 2 L of oxygen .  She has been using the same CPAP machine since 2017, . The machine is still functioning, but having to tape the filter door. Had trouble turning it on last night . CPAP download shows excellent 100% compliance.  Daily average usage at 7 hours.  She is on CPAP 10 cm H2O.  AHI 1.7/hour.  Patient says she feels that she benefits from CPAP therapy.   She experienced a sore throat recently due to a cold she had over the past week,  Her throat does not hurt during the night while using the CPAPShe has received her flu shot, pneumonia shot,  and COVID shot.  She was seen by her primary care provider this morning.  She is retired and enjoys her retirement, feeling that it has improved her quality of life.     Allergies  Allergen Reactions   Aspirin Anaphylaxis and Swelling   Chicory Root [Cichorium Intybus] Anaphylaxis, Shortness Of Breath, Itching and Swelling    Swelling of tongue and throat. Scratch throat. cough   Inulin Anaphylaxis   Nsaids Anaphylaxis and Swelling   Shellfish Allergy  Anaphylaxis and Swelling   Celebrex [Celecoxib] Swelling    Facial swelling   Iodine     Immunization History  Administered Date(s) Administered   Fluad Quad(high Dose 65+) 10/19/2020, 09/26/2021, 09/22/2022   Fluzone Influenza virus vaccine,trivalent (IIV3), split virus 10/22/2015, 10/27/2016, 10/27/2017, 10/25/2019, 09/27/2020, 10/03/2021   Influenza Split 10/25/2015, 10/18/2016, 10/19/2017   Influenza,inj,quad, With Preservative 10/22/2016   Moderna Sars-Covid-2 Vaccination 01/29/2019, 02/18/2019, 10/30/2021   PFIZER(Purple Top)SARS-COV-2 Vaccination 02/28/2019, 03/21/2019   Pfizer Covid-19 Vaccine Bivalent Booster 21yrs & up 09/27/2020   Pfizer(Comirnaty)Fall Seasonal Vaccine 12 years and older 09/29/2019, 05/22/2020, 06/03/2021   Pneumococcal Conjugate-13 09/13/2018   Pneumococcal Polysaccharide-23 04/15/2016   Respiratory Syncytial Virus Vaccine,Recomb Aduvanted(Arexvy) 09/26/2021   Td (Adult) 06/04/2004   Tdap 08/13/2010, 06/06/2020   Zoster Recombinant(Shingrix) 02/26/2018, 09/02/2018  Zoster, Live 10/16/2015, 02/26/2018, 09/02/2018    Past Medical History:  Diagnosis Date   Anaphylaxis    Chicory root, shellfish   Arthritis    hands, knees, neck - no meds   Bradycardia 01/31/2022   Celiac disease    Depression    Fibromyalgia    GERD (gastroesophageal reflux disease)    no meds   Morbid obesity (HCC) 10/19/2015   NSVT (nonsustained ventricular tachycardia) (HCC) 08/26/2022   PONV (postoperative nausea and  vomiting)    SVD (spontaneous vaginal delivery)    x 2   SVT (supraventricular tachycardia) 08/26/2022   Systolic murmur 10/19/2015    Tobacco History: Social History   Tobacco Use  Smoking Status Never  Smokeless Tobacco Never   Counseling given: Not Answered   Outpatient Medications Prior to Visit  Medication Sig Dispense Refill   acetaminophen  (TYLENOL ) 500 MG tablet Take 1,000 mg by mouth daily as needed for headache.     Ascorbic Acid (VITAMIN C PO) Take 1 tablet by mouth daily.     atorvastatin  (LIPITOR) 20 MG tablet take 1 tablet every day 90 tablet 3   Cholecalciferol (VITAMIN D3 PO) Take 1 capsule by mouth daily.     Cyanocobalamin  (B-12 PO) Take 1 capsule by mouth daily.     desonide  (DESOWEN ) 0.05 % cream Use 1 application sparingly twice a day as needed to area on neck.  Do not use longer than 7 days in a row 30 g 0   diclofenac sodium (VOLTAREN) 1 % GEL Apply 2 g topically daily as needed (for pain).     diphenhydrAMINE  (BENADRYL ) 25 MG tablet Take 25 mg by mouth 2 (two) times daily as needed for sleep, allergies or itching.     DULoxetine  (CYMBALTA ) 60 MG capsule Take 2 capsules (120 mg total) by mouth daily. 180 capsule 1   EPINEPHrine  0.3 mg/0.3 mL IJ SOAJ injection Inject 0.3 mg into the muscle as needed for anaphylaxis. 2 each 1   glucose blood (FREESTYLE LITE) test strip Use to test your blood sugar daily 100 strip 3   hydrocortisone  (ANUSOL -HC) 2.5 % rectal cream Place 1 Application rectally 2 (two) times daily as needed for rectal irritation for up to 1 week. 30 g 0   hydrocortisone  2.5 % ointment Apply to affected skin 2 (two) times daily until clear. 28.35 g 3   Lancets (FREESTYLE) lancets Use to check blood sugar daily 100 each 3   metFORMIN  (GLUCOPHAGE -XR) 500 MG 24 hr tablet Take 2 tablets (1,000 mg total) by mouth with evening meal 180 tablet 1   metroNIDAZOLE  (METROCREAM ) 0.75 % cream Apply a small amount to skin twice a day. 45 g 3   mupirocin  ointment  (BACTROBAN ) 2 % Apply 1 Application topically 2 (two) times daily as needed. 22 g 3   Omega-3 Fatty Acids (FISH OIL PO) Take 2 capsules by mouth daily.     ondansetron  (ZOFRAN ) 4 MG tablet Take 1 tablet (4 mg total) by mouth every 8 (eight) hours as needed. 20 tablet 0   Semaglutide , 2 MG/DOSE, (OZEMPIC , 2 MG/DOSE,) 8 MG/3ML SOPN Inject 2 mg into the skin once a week. 3 mL 0   amoxicillin  (AMOXIL ) 500 MG capsule Take 4 capsules (2,000 mg total) by mouth 1 hour prior to dental appointment. (Patient not taking: Reported on 12/28/2023) 8 capsule PRN   atorvastatin  (LIPITOR) 20 MG tablet Take 1 tablet (20 mg total) by mouth daily. (Patient not taking: Reported on 12/28/2023) 90 tablet  3   DULoxetine  (CYMBALTA ) 30 MG capsule Take 3 capsules (90 mg total) by mouth daily. (Patient not taking: Reported on 12/28/2023) 270 capsule 1   DULoxetine  (CYMBALTA ) 30 MG capsule Take 3 capsules (90 mg total) by mouth daily. (Patient not taking: Reported on 12/28/2023) 270 capsule 0   glucose blood (FREESTYLE LITE) test strip Use to test your blood sugar finger stick once a day. (Patient not taking: Reported on 12/28/2023) 100 strip 3   glucose blood (FREESTYLE LITE) test strip Use to test your blood sugar daily (Patient not taking: Reported on 12/28/2023) 100 strip 3   metFORMIN  (GLUCOPHAGE -XR) 500 MG 24 hr tablet Take 1,000 mg by mouth daily with breakfast. (Patient not taking: Reported on 12/28/2023)     No facility-administered medications prior to visit.     Review of Systems:   Constitutional:   No  weight loss, night sweats,  Fevers, chills, fatigue, or  lassitude.  HEENT:   No headaches,  Difficulty swallowing,  Tooth/dental problems, or  Sore throat,                No sneezing, itching, ear ache, +nasal congestion, post nasal drip,   CV:  No chest pain,  Orthopnea, PND, swelling in lower extremities, anasarca, dizziness, palpitations, syncope.   GI  No heartburn, indigestion, abdominal pain, nausea, vomiting,  diarrhea, change in bowel habits, loss of appetite, bloody stools.   Resp:   No chest wall deformity  Skin: no rash or lesions.  GU: no dysuria, change in color of urine, no urgency or frequency.  No flank pain, no hematuria   MS:  No joint pain or swelling.  No decreased range of motion.  No back pain.    Physical Exam  BP 127/78   Pulse 77   Temp 97.6 F (36.4 C)   Ht 5' 2.5 (1.588 m) Comment: Per pt  Wt 218 lb 3.2 oz (99 kg)   SpO2 96% Comment: RA  BMI 39.27 kg/m   GEN: A/Ox3; pleasant , NAD, well nourished    HEENT:  [place]/AT,  NOSE-clear, THROAT-clear, no lesions, no postnasal drip or exudate noted.   NECK:  Supple w/ fair ROM; no JVD; normal carotid impulses w/o bruits; no thyromegaly or nodules palpated; no lymphadenopathy.    RESP  Clear  P & A; w/o, wheezes/ rales/ or rhonchi. no accessory muscle use, no dullness to percussion  CARD:  RRR, no m/r/g, no peripheral edema, pulses intact, no cyanosis or clubbing.  GI:   Soft & nt; nml bowel sounds; no organomegaly or masses detected.   Musco: Warm bil, no deformities or joint swelling noted.   Neuro: alert, no focal deficits noted.    Skin: Warm, no lesions or rashes    Lab Results:Reviewed 12/28/2023   CBC   BMET  No results found for: BNP  ProBNP No results found for: PROBNP  Imaging: No results found.  Administration History     None           No data to display          No results found for: NITRICOXIDE      No data to display              Assessment & Plan:   Assessment and Plan Assessment & Plan Obstructive sleep apnea and OHS  managed with CPAP and nocturnal supplemental oxygen  at 2 L.  Patient has excellent control and compliance on CPAP with oxygen .  She has  perceived benefit. Obstructive sleep apnea is well-managed with CPAP and nocturnal supplemental oxygen . The CPAP machine, is due for replacement  . She uses a full face mask  Ordered a new CPAP machine  continue on current settings at 10 cm H2O.. Continue nocturnal supplemental oxygen  at 2 liters. Ensure CPAP machine maintenance with distilled water.  Chronic respiratory failure in the setting of obstructive sleep apnea and OHS.  Continue on oxygen  2 L with CPAP at bedtime.  BMI 39.  Continue with healthy weight loss  URI -resolving.  Continue on current regimen and follow up with PCP   Plan  Patient Instructions  Continue on CPAP At bedtime with Oxygen  2l/m .  Order for new CPAP  Work on healthy weight (keep up good work) .  Do not drive if sleepy  Follow with Tavion Senkbeil NP in 1 year and As needed            Madelin Stank, NP 12/28/2023

## 2023-12-29 ENCOUNTER — Other Ambulatory Visit (HOSPITAL_COMMUNITY): Payer: Self-pay

## 2023-12-30 ENCOUNTER — Encounter (HOSPITAL_BASED_OUTPATIENT_CLINIC_OR_DEPARTMENT_OTHER): Payer: Self-pay | Admitting: Physical Therapy

## 2023-12-30 ENCOUNTER — Other Ambulatory Visit (HOSPITAL_COMMUNITY): Payer: Self-pay

## 2023-12-30 ENCOUNTER — Ambulatory Visit (HOSPITAL_BASED_OUTPATIENT_CLINIC_OR_DEPARTMENT_OTHER): Payer: Self-pay | Attending: Sports Medicine | Admitting: Physical Therapy

## 2023-12-30 DIAGNOSIS — M6281 Muscle weakness (generalized): Secondary | ICD-10-CM | POA: Insufficient documentation

## 2023-12-30 DIAGNOSIS — M25511 Pain in right shoulder: Secondary | ICD-10-CM

## 2023-12-31 ENCOUNTER — Other Ambulatory Visit (HOSPITAL_COMMUNITY): Payer: Self-pay

## 2023-12-31 MED ORDER — HYDROCODONE BIT-HOMATROP MBR 5-1.5 MG/5ML PO SOLN
5.0000 mL | Freq: Four times a day (QID) | ORAL | 0 refills | Status: AC | PRN
  Filled 2023-12-31: qty 120, 6d supply, fill #0

## 2023-12-31 MED ORDER — DOXYCYCLINE HYCLATE 100 MG PO TABS
100.0000 mg | ORAL_TABLET | Freq: Two times a day (BID) | ORAL | 0 refills | Status: AC
  Filled 2023-12-31: qty 14, 7d supply, fill #0

## 2024-01-01 ENCOUNTER — Ambulatory Visit
Admission: RE | Admit: 2024-01-01 | Discharge: 2024-01-01 | Disposition: A | Source: Ambulatory Visit | Attending: Family Medicine | Admitting: Family Medicine

## 2024-01-01 DIAGNOSIS — Z1231 Encounter for screening mammogram for malignant neoplasm of breast: Secondary | ICD-10-CM

## 2024-02-05 ENCOUNTER — Other Ambulatory Visit (HOSPITAL_COMMUNITY): Payer: Self-pay

## 2024-02-08 ENCOUNTER — Other Ambulatory Visit (HOSPITAL_COMMUNITY): Payer: Self-pay

## 2024-02-08 MED ORDER — OZEMPIC (2 MG/DOSE) 8 MG/3ML ~~LOC~~ SOPN
2.0000 mg | PEN_INJECTOR | SUBCUTANEOUS | 0 refills | Status: AC
  Filled 2024-02-08: qty 3, 28d supply, fill #0

## 2024-08-24 ENCOUNTER — Ambulatory Visit: Admitting: Allergy
